# Patient Record
Sex: Female | Born: 1935 | Race: White | Hispanic: No | Marital: Single | Smoking: Never smoker
Health system: Southern US, Community
[De-identification: ages and names within clinical notes are randomized; demographics above are authoritative.]

## PROBLEM LIST (undated history)

## (undated) DIAGNOSIS — E785 Hyperlipidemia, unspecified: Secondary | ICD-10-CM

## (undated) DIAGNOSIS — R609 Edema, unspecified: Secondary | ICD-10-CM

## (undated) DIAGNOSIS — D649 Anemia, unspecified: Secondary | ICD-10-CM

## (undated) DIAGNOSIS — C4490 Unspecified malignant neoplasm of skin, unspecified: Secondary | ICD-10-CM

## (undated) DIAGNOSIS — C44721 Squamous cell carcinoma of skin of unspecified lower limb, including hip: Secondary | ICD-10-CM

## (undated) DIAGNOSIS — R2681 Unsteadiness on feet: Secondary | ICD-10-CM

## (undated) DIAGNOSIS — M8580 Other specified disorders of bone density and structure, unspecified site: Secondary | ICD-10-CM

## (undated) DIAGNOSIS — I341 Nonrheumatic mitral (valve) prolapse: Secondary | ICD-10-CM

## (undated) DIAGNOSIS — S5290XA Unspecified fracture of unspecified forearm, initial encounter for closed fracture: Secondary | ICD-10-CM

## (undated) DIAGNOSIS — E039 Hypothyroidism, unspecified: Secondary | ICD-10-CM

## (undated) DIAGNOSIS — S7290XA Unspecified fracture of unspecified femur, initial encounter for closed fracture: Secondary | ICD-10-CM

## (undated) DIAGNOSIS — F0390 Unspecified dementia without behavioral disturbance: Secondary | ICD-10-CM

## (undated) DIAGNOSIS — I10 Essential (primary) hypertension: Secondary | ICD-10-CM

## (undated) DIAGNOSIS — K5900 Constipation, unspecified: Secondary | ICD-10-CM

## (undated) DIAGNOSIS — E559 Vitamin D deficiency, unspecified: Secondary | ICD-10-CM

## (undated) HISTORY — DX: Unspecified dementia without behavioral disturbance: F03.90

## (undated) HISTORY — DX: Constipation, unspecified: K59.00

## (undated) HISTORY — DX: Squamous cell carcinoma of skin of unspecified lower limb, including hip: C44.721

## (undated) HISTORY — DX: Unsteadiness on feet: R26.81

## (undated) HISTORY — DX: Edema, unspecified: R60.9

## (undated) HISTORY — PX: ABDOMINAL HYSTERECTOMY: SHX81

---

## 1997-11-26 ENCOUNTER — Ambulatory Visit (HOSPITAL_COMMUNITY): Admission: RE | Admit: 1997-11-26 | Discharge: 1997-11-26 | Payer: Self-pay | Admitting: Family Medicine

## 1998-11-01 ENCOUNTER — Ambulatory Visit (HOSPITAL_COMMUNITY): Admission: RE | Admit: 1998-11-01 | Discharge: 1998-11-01 | Payer: Self-pay | Admitting: Obstetrics & Gynecology

## 1999-08-06 ENCOUNTER — Encounter: Admission: RE | Admit: 1999-08-06 | Discharge: 1999-08-06 | Payer: Self-pay | Admitting: Family Medicine

## 1999-08-06 ENCOUNTER — Encounter: Payer: Self-pay | Admitting: Family Medicine

## 2000-08-06 ENCOUNTER — Encounter: Payer: Self-pay | Admitting: Family Medicine

## 2000-08-06 ENCOUNTER — Encounter: Admission: RE | Admit: 2000-08-06 | Discharge: 2000-08-06 | Payer: Self-pay | Admitting: Family Medicine

## 2001-08-01 ENCOUNTER — Encounter: Payer: Self-pay | Admitting: Family Medicine

## 2001-08-01 ENCOUNTER — Encounter: Admission: RE | Admit: 2001-08-01 | Discharge: 2001-08-01 | Payer: Self-pay | Admitting: Family Medicine

## 2001-12-22 ENCOUNTER — Ambulatory Visit (HOSPITAL_COMMUNITY): Admission: RE | Admit: 2001-12-22 | Discharge: 2001-12-22 | Payer: Self-pay | Admitting: Specialist

## 2002-08-07 ENCOUNTER — Encounter: Payer: Self-pay | Admitting: Family Medicine

## 2002-08-07 ENCOUNTER — Encounter: Admission: RE | Admit: 2002-08-07 | Discharge: 2002-08-07 | Payer: Self-pay | Admitting: Family Medicine

## 2003-04-24 ENCOUNTER — Emergency Department (HOSPITAL_COMMUNITY): Admission: EM | Admit: 2003-04-24 | Discharge: 2003-04-24 | Payer: Self-pay | Admitting: Emergency Medicine

## 2003-08-27 ENCOUNTER — Ambulatory Visit (HOSPITAL_COMMUNITY): Admission: RE | Admit: 2003-08-27 | Discharge: 2003-08-27 | Payer: Self-pay | Admitting: Family Medicine

## 2004-10-06 ENCOUNTER — Ambulatory Visit (HOSPITAL_COMMUNITY): Admission: RE | Admit: 2004-10-06 | Discharge: 2004-10-06 | Payer: Self-pay | Admitting: Family Medicine

## 2004-10-20 ENCOUNTER — Encounter: Admission: RE | Admit: 2004-10-20 | Discharge: 2004-10-20 | Payer: Self-pay | Admitting: Family Medicine

## 2005-10-07 ENCOUNTER — Ambulatory Visit (HOSPITAL_COMMUNITY): Admission: RE | Admit: 2005-10-07 | Discharge: 2005-10-07 | Payer: Self-pay | Admitting: Family Medicine

## 2006-02-10 ENCOUNTER — Emergency Department (HOSPITAL_COMMUNITY): Admission: EM | Admit: 2006-02-10 | Discharge: 2006-02-10 | Payer: Self-pay | Admitting: Emergency Medicine

## 2006-03-05 ENCOUNTER — Encounter: Admission: RE | Admit: 2006-03-05 | Discharge: 2006-03-05 | Payer: Self-pay | Admitting: Gastroenterology

## 2006-10-12 ENCOUNTER — Ambulatory Visit (HOSPITAL_COMMUNITY): Admission: RE | Admit: 2006-10-12 | Discharge: 2006-10-12 | Payer: Self-pay | Admitting: Family Medicine

## 2007-01-05 ENCOUNTER — Encounter: Admission: RE | Admit: 2007-01-05 | Discharge: 2007-01-05 | Payer: Self-pay | Admitting: Geriatric Medicine

## 2007-01-14 ENCOUNTER — Encounter: Admission: RE | Admit: 2007-01-14 | Discharge: 2007-01-14 | Payer: Self-pay | Admitting: Geriatric Medicine

## 2007-10-18 ENCOUNTER — Ambulatory Visit (HOSPITAL_COMMUNITY): Admission: RE | Admit: 2007-10-18 | Discharge: 2007-10-18 | Payer: Self-pay | Admitting: Geriatric Medicine

## 2009-10-22 ENCOUNTER — Inpatient Hospital Stay (HOSPITAL_COMMUNITY): Admission: EM | Admit: 2009-10-22 | Discharge: 2009-10-28 | Payer: Self-pay | Admitting: Emergency Medicine

## 2010-07-19 ENCOUNTER — Encounter: Payer: Self-pay | Admitting: Gastroenterology

## 2010-07-20 ENCOUNTER — Encounter: Payer: Self-pay | Admitting: Family Medicine

## 2010-07-20 ENCOUNTER — Encounter: Payer: Self-pay | Admitting: Geriatric Medicine

## 2010-09-16 LAB — BASIC METABOLIC PANEL
BUN: 24 mg/dL — ABNORMAL HIGH (ref 6–23)
BUN: 29 mg/dL — ABNORMAL HIGH (ref 6–23)
BUN: 32 mg/dL — ABNORMAL HIGH (ref 6–23)
CO2: 26 mEq/L (ref 19–32)
CO2: 29 mEq/L (ref 19–32)
CO2: 28 mEq/L (ref 19–32)
Calcium: 8 mg/dL — ABNORMAL LOW (ref 8.4–10.5)
Calcium: 8.3 mg/dL — ABNORMAL LOW (ref 8.4–10.5)
Calcium: 8 mg/dL — ABNORMAL LOW (ref 8.4–10.5)
Calcium: 8 mg/dL — ABNORMAL LOW (ref 8.4–10.5)
Chloride: 105 mEq/L (ref 96–112)
Creatinine, Ser: 0.84 mg/dL (ref 0.4–1.2)
Creatinine, Ser: 0.85 mg/dL (ref 0.4–1.2)
GFR calc non Af Amer: >60 mL/min (ref >60)
GFR calc non Af Amer: 34 mL/min — ABNORMAL LOW (ref >60)
GFR calc non Af Amer: 41 mL/min — ABNORMAL LOW (ref >60)
GFR calc non Af Amer: 44 mL/min — ABNORMAL LOW (ref >60)
Glucose, Bld: 95 mg/dL (ref 70–99)
Glucose, Bld: 102 mg/dL — ABNORMAL HIGH (ref 70–99)
Glucose, Bld: 111 mg/dL — ABNORMAL HIGH (ref 70–99)
Potassium: 3.6 mEq/L (ref 3.5–5.1)
Potassium: 4.4 mEq/L (ref 3.5–5.1)
Potassium: 4.8 mEq/L (ref 3.5–5.1)
Sodium: 135 mEq/L (ref 135–145)
Sodium: 135 mEq/L (ref 135–145)

## 2010-09-16 LAB — CBC
HCT: 31.2 % — ABNORMAL LOW (ref 36.0–46.0)
HCT: 32.2 % — ABNORMAL LOW (ref 36.0–46.0)
HCT: 33.4 % — ABNORMAL LOW (ref 36.0–46.0)
Hemoglobin: 11 g/dL — ABNORMAL LOW (ref 12.0–15.0)
Hemoglobin: 10.1 g/dL — ABNORMAL LOW (ref 12.0–15.0)
Hemoglobin: 10.9 g/dL — ABNORMAL LOW (ref 12.0–15.0)
Hemoglobin: 11.1 g/dL — ABNORMAL LOW (ref 12.0–15.0)
MCHC: 33.2 g/dL (ref 30.0–36.0)
MCV: 92 fL (ref 78.0–100.0)
MCV: 92.1 fL (ref 78.0–100.0)
Platelets: 122 10*3/uL — ABNORMAL LOW (ref 150–400)
Platelets: 149 10*3/uL — ABNORMAL LOW (ref 150–400)
Platelets: 75 10*3/uL — ABNORMAL LOW (ref 150–400)
Platelets: 77 10*3/uL — ABNORMAL LOW (ref 150–400)
RBC: 3.57 MIL/uL — ABNORMAL LOW (ref 3.87–5.11)
RBC: 3.49 MIL/uL — ABNORMAL LOW (ref 3.87–5.11)
RDW: 13.6 % (ref 11.5–15.5)
RDW: 13.7 % (ref 11.5–15.5)
WBC: 9.9 10*3/uL (ref 4.0–10.5)
WBC: 10.8 10*3/uL — ABNORMAL HIGH (ref 4.0–10.5)
WBC: 11.2 10*3/uL — ABNORMAL HIGH (ref 4.0–10.5)
WBC: 9.3 10*3/uL (ref 4.0–10.5)

## 2010-09-16 LAB — COMPREHENSIVE METABOLIC PANEL
ALT: 22 U/L (ref 0–35)
Albumin: 3.3 g/dL — ABNORMAL LOW (ref 3.5–5.2)
BUN: 26 mg/dL — ABNORMAL HIGH (ref 6–23)
CO2: 30 mEq/L (ref 19–32)
Calcium: 8.4 mg/dL (ref 8.4–10.5)
GFR calc Af Amer: 45 mL/min — ABNORMAL LOW (ref >60)
Glucose, Bld: 152 mg/dL — ABNORMAL HIGH (ref 70–99)
Total Protein: 6.3 g/dL (ref 6.0–8.3)

## 2010-09-16 LAB — CROSSMATCH
ABO/RH(D): O POS
Antibody Screen: NEGATIVE

## 2010-09-16 LAB — GLUCOSE, CAPILLARY: Glucose-Capillary: 115 mg/dL — ABNORMAL HIGH (ref 70–99)

## 2010-09-16 LAB — DIFFERENTIAL
Basophils Relative: 0 % (ref 0–1)
Eosinophils Absolute: 0 10*3/uL (ref 0.0–0.7)
Lymphocytes Relative: 13 % (ref 12–46)
Neutro Abs: 7.4 10*3/uL (ref 1.7–7.7)

## 2010-09-16 LAB — URINALYSIS, ROUTINE W REFLEX MICROSCOPIC
Bilirubin Urine: NEGATIVE
Hgb urine dipstick: NEGATIVE
Specific Gravity, Urine: 1.019 (ref 1.005–1.030)

## 2010-09-16 LAB — PROTIME-INR
INR: 1.14 (ref 0.00–1.49)
Prothrombin Time: 14.5 seconds (ref 11.6–15.2)

## 2010-09-16 LAB — URINE CULTURE

## 2010-09-16 LAB — APTT
aPTT: 26 seconds (ref 24–37)
aPTT: 30 seconds (ref 24–37)

## 2011-06-30 HISTORY — PX: TOTAL HIP ARTHROPLASTY: SHX124

## 2011-12-02 ENCOUNTER — Emergency Department (HOSPITAL_COMMUNITY): Payer: Medicare Other

## 2011-12-02 ENCOUNTER — Inpatient Hospital Stay (HOSPITAL_COMMUNITY)
Admission: EM | Admit: 2011-12-02 | Discharge: 2011-12-07 | DRG: 481 | Disposition: A | Discharge status: Skilled Nursing Facility | Payer: Medicare Other | Attending: Internal Medicine | Admitting: Internal Medicine

## 2011-12-02 ENCOUNTER — Encounter (HOSPITAL_COMMUNITY): Payer: Self-pay | Admitting: Emergency Medicine

## 2011-12-02 DIAGNOSIS — S72009A Fracture of unspecified part of neck of unspecified femur, initial encounter for closed fracture: Secondary | ICD-10-CM

## 2011-12-02 DIAGNOSIS — F0390 Unspecified dementia without behavioral disturbance: Secondary | ICD-10-CM | POA: Diagnosis present

## 2011-12-02 DIAGNOSIS — E860 Dehydration: Secondary | ICD-10-CM

## 2011-12-02 DIAGNOSIS — S72001A Fracture of unspecified part of neck of right femur, initial encounter for closed fracture: Secondary | ICD-10-CM | POA: Diagnosis present

## 2011-12-02 DIAGNOSIS — S0990XA Unspecified injury of head, initial encounter: Secondary | ICD-10-CM

## 2011-12-02 DIAGNOSIS — E039 Hypothyroidism, unspecified: Secondary | ICD-10-CM | POA: Diagnosis present

## 2011-12-02 DIAGNOSIS — D62 Acute posthemorrhagic anemia: Secondary | ICD-10-CM | POA: Diagnosis not present

## 2011-12-02 DIAGNOSIS — S72143A Displaced intertrochanteric fracture of unspecified femur, initial encounter for closed fracture: Principal | ICD-10-CM | POA: Diagnosis present

## 2011-12-02 DIAGNOSIS — I10 Essential (primary) hypertension: Secondary | ICD-10-CM

## 2011-12-02 DIAGNOSIS — S0003XA Contusion of scalp, initial encounter: Secondary | ICD-10-CM | POA: Diagnosis present

## 2011-12-02 DIAGNOSIS — Y92009 Unspecified place in unspecified non-institutional (private) residence as the place of occurrence of the external cause: Secondary | ICD-10-CM

## 2011-12-02 DIAGNOSIS — W010XXA Fall on same level from slipping, tripping and stumbling without subsequent striking against object, initial encounter: Secondary | ICD-10-CM | POA: Diagnosis present

## 2011-12-02 HISTORY — DX: Anemia, unspecified: D64.9

## 2011-12-02 HISTORY — DX: Essential (primary) hypertension: I10

## 2011-12-02 HISTORY — DX: Hyperlipidemia, unspecified: E78.5

## 2011-12-02 HISTORY — DX: Unspecified fracture of unspecified femur, initial encounter for closed fracture: S72.90XA

## 2011-12-02 HISTORY — DX: Unspecified fracture of unspecified forearm, initial encounter for closed fracture: S52.90XA

## 2011-12-02 HISTORY — DX: Other specified disorders of bone density and structure, unspecified site: M85.80

## 2011-12-02 LAB — CBC
HCT: 36.5 % (ref 36.0–46.0)
Hemoglobin: 12 g/dL (ref 12.0–15.0)
MCHC: 32.9 g/dL (ref 30.0–36.0)
RBC: 4.23 MIL/uL (ref 3.87–5.11)
WBC: 9.7 10*3/uL (ref 4.0–10.5)

## 2011-12-02 LAB — DIFFERENTIAL
Basophils Relative: 1 % (ref 0–1)
Lymphocytes Relative: 25 % (ref 12–46)
Lymphs Abs: 2.4 10*3/uL (ref 0.7–4.0)
Monocytes Absolute: 0.7 10*3/uL (ref 0.1–1.0)
Monocytes Relative: 7 % (ref 3–12)
Neutro Abs: 6.4 10*3/uL (ref 1.7–7.7)
Neutrophils Relative %: 66 % (ref 43–77)

## 2011-12-02 LAB — URINALYSIS, ROUTINE W REFLEX MICROSCOPIC
Glucose, UA: NEGATIVE mg/dL
Leukocytes, UA: NEGATIVE
Protein, ur: NEGATIVE mg/dL
Specific Gravity, Urine: 1.017 (ref 1.005–1.030)
Urobilinogen, UA: 0.2 mg/dL (ref 0.0–1.0)

## 2011-12-02 LAB — BASIC METABOLIC PANEL
BUN: 25 mg/dL — ABNORMAL HIGH (ref 6–23)
CO2: 25 mEq/L (ref 19–32)
Chloride: 98 mEq/L (ref 96–112)
GFR calc Af Amer: 42 mL/min — ABNORMAL LOW (ref >90)
Potassium: 3.5 mEq/L (ref 3.5–5.1)

## 2011-12-02 LAB — TYPE AND SCREEN
ABO/RH(D): O POS
Antibody Screen: NEGATIVE

## 2011-12-02 LAB — URINE MICROSCOPIC-ADD ON

## 2011-12-02 MED ORDER — MORPHINE SULFATE 2 MG/ML IJ SOLN
0.5000 mg | INTRAMUSCULAR | Status: DC | PRN
  Administered 2011-12-02 – 2011-12-04 (×2): 0.5 mg via INTRAVENOUS
  Filled 2011-12-02 (×2): qty 1

## 2011-12-02 MED ORDER — SODIUM CHLORIDE 0.9 % IV BOLUS (SEPSIS): 1000.0000 mL | Freq: Once | INTRAVENOUS | Status: DC

## 2011-12-02 MED ORDER — ONDANSETRON HCL 4 MG/2ML IJ SOLN
4.0000 mg | Freq: Once | INTRAMUSCULAR | Status: AC
  Administered 2011-12-02: 4 mg via INTRAVENOUS
  Filled 2011-12-02: qty 2

## 2011-12-02 MED ORDER — SODIUM CHLORIDE 0.9 % IV SOLN: INTRAVENOUS | Status: DC

## 2011-12-02 MED ORDER — SODIUM CHLORIDE 0.9 % IV SOLN
INTRAVENOUS | Status: DC
  Administered 2011-12-02: 1000 mL via INTRAVENOUS
  Administered 2011-12-03: 12:00:00 via INTRAVENOUS
  Administered 2011-12-04: 50 mL/h via INTRAVENOUS

## 2011-12-02 MED ORDER — SODIUM CHLORIDE 0.9 % IV SOLN
INTRAVENOUS | Status: DC
  Administered 2011-12-02: 19:00:00 via INTRAVENOUS

## 2011-12-02 MED ORDER — FENTANYL CITRATE 0.05 MG/ML IJ SOLN
50.0000 ug | Freq: Once | INTRAMUSCULAR | Status: AC
  Administered 2011-12-02: 50 ug via INTRAVENOUS
  Filled 2011-12-02: qty 2

## 2011-12-02 NOTE — ED Notes (Signed)
Off floor for testing 

## 2011-12-02 NOTE — ED Notes (Addendum)
Per EMS, witnessed fall-right hip injury, bump on back of head, back pain with palpation-unknown whether or not she lost consciousness-133mcg of fentanyl given in route

## 2011-12-02 NOTE — Consult Note (Signed)
Reason for Consult: Right intertrochanteric femur fracture  Referring Physician: Dr Chilton Si physician  Jasmin Rodriguez is an 78 y.o. female.  HPI: Jasmin Rodriguez is a 78 year old female who lives independently at [facility] and fell tonight in her living room. She denies, dizziness, light headedness, chest pain, SOB, palpitations, or other systemic symptoms associated with the fall. She feels as though she may have tripped on something leading to the fall. She developed immediate right hip pain and inability to stand up. She did not have a head injury or LOC. Only complaint in ED is right hip pain. No distal paresthesias. No knee pain or lower leg pain. She has a history of a left intertrochanteric femur fracture treated by Dr. Charlann Boxer several years ago. She ambulates inde[pendently with occasional use of a cane but usually does not use any assistive devices.  Past Medical History  Diagnosis Date  . Hypertension   . Anemia   . Thyroid disease   . Hyperlipidemia   . Osteopenia   . Radial fracture   . Femur fracture     Past Surgical History  Procedure Date  . Hip fracture surgery     No family history on file.  Social History:  does not have a smoking history on file. She does not have any smokeless tobacco history on file. She reports that she does not drink alcohol or use illicit drugs.  Allergies:  Allergies  Allergen Reactions  . Tramadol   . Zoloft (Sertraline Hcl)     Medications: I have reviewed the patient's current medications.  Results for orders placed during the hospital encounter of 12/02/11 (from the past 48 hour(s))  CBC     Status: Normal   Collection Time   12/02/11  6:55 PM      Component Value Range Comment   WBC 9.7  4.0 - 10.5 (K/uL)    RBC 4.23  3.87 - 5.11 (MIL/uL)    Hemoglobin 12.0  12.0 - 15.0 (g/dL)    HCT 96.0  45.4 - 09.8 (%)    MCV 86.3  78.0 - 100.0 (fL)    MCH 28.4  26.0 - 34.0 (pg)    MCHC 32.9  30.0 - 36.0 (g/dL)    RDW 11.9   14.7 - 82.9 (%)    Platelets 168  150 - 400 (K/uL)   DIFFERENTIAL     Status: Normal   Collection Time   12/02/11  6:55 PM      Component Value Range Comment   Neutrophils Relative 66  43 - 77 (%)    Neutro Abs 6.4  1.7 - 7.7 (K/uL)    Lymphocytes Relative 25  12 - 46 (%)    Lymphs Abs 2.4  0.7 - 4.0 (K/uL)    Monocytes Relative 7  3 - 12 (%)    Monocytes Absolute 0.7  0.1 - 1.0 (K/uL)    Eosinophils Relative 1  0 - 5 (%)    Eosinophils Absolute 0.1  0.0 - 0.7 (K/uL)    Basophils Relative 1  0 - 1 (%)    Basophils Absolute 0.1  0.0 - 0.1 (K/uL)   BASIC METABOLIC PANEL     Status: Abnormal   Collection Time   12/02/11  6:55 PM      Component Value Range Comment   Sodium 135  135 - 145 (mEq/L)    Potassium 3.5  3.5 - 5.1 (mEq/L)    Chloride 98  96 - 112 (  mEq/L)    CO2 25  19 - 32 (mEq/L)    Glucose, Bld 132 (*) 70 - 99 (mg/dL)    BUN 25 (*) 6 - 23 (mg/dL)    Creatinine, Ser 3.08 (*) 0.50 - 1.10 (mg/dL)    Calcium 9.1  8.4 - 10.5 (mg/dL)    GFR calc non Af Amer 36 (*) >90 (mL/min)    GFR calc Af Amer 42 (*) >90 (mL/min)   PROTIME-INR     Status: Normal   Collection Time   12/02/11  6:55 PM      Component Value Range Comment   Prothrombin Time 14.3  11.6 - 15.2 (seconds)    INR 1.09  0.00 - 1.49    TYPE AND SCREEN     Status: Normal   Collection Time   12/02/11  6:55 PM      Component Value Range Comment   ABO/RH(D) O POS      Antibody Screen NEG      Sample Expiration 12/05/2011     URINALYSIS, ROUTINE W REFLEX MICROSCOPIC     Status: Abnormal   Collection Time   12/02/11  7:30 PM      Component Value Range Comment   Color, Urine YELLOW  YELLOW     APPearance CLOUDY (*) CLEAR     Specific Gravity, Urine 1.017  1.005 - 1.030     pH 6.5  5.0 - 8.0     Glucose, UA NEGATIVE  NEGATIVE (mg/dL)    Hgb urine dipstick TRACE (*) NEGATIVE     Bilirubin Urine NEGATIVE  NEGATIVE     Ketones, ur NEGATIVE  NEGATIVE (mg/dL)    Protein, ur NEGATIVE  NEGATIVE (mg/dL)    Urobilinogen, UA 0.2   0.0 - 1.0 (mg/dL)    Nitrite NEGATIVE  NEGATIVE     Leukocytes, UA NEGATIVE  NEGATIVE    URINE MICROSCOPIC-ADD ON     Status: Normal   Collection Time   12/02/11  7:30 PM      Component Value Range Comment   RBC / HPF 0-2  <3 (RBC/hpf)     Dg Chest 1 View  12/02/2011  *RADIOLOGY REPORT*  Clinical Data: Fall, hip pain  CHEST - 1 VIEW  Comparison: 10/21/2009  Findings: Cardiomegaly again noted.  No acute infiltrate or pleural effusion.  No pulmonary edema.  Stable calcified granuloma right lower lobe.  IMPRESSION: No active disease.  No significant change.  Original Report Authenticated By: Natasha Mead, M.D.   Dg Hip Complete Right  12/02/2011  *RADIOLOGY REPORT*  Clinical Data: Fall, right hip pain  RIGHT HIP - COMPLETE 2+ VIEW  Comparison: 10/21/2009.  Findings: Three views of the right hip submitted.  There is displaced intertrochanteric fracture of the right proximal femur. Metallic fixation pin and metallic rod noted in proximal left femur.  Osteopenia is noted.  Degenerative changes lumbar spine. A subtrochanteric vertical fracture line is extending in the proximal femoral shaft right femur.  IMPRESSION: Displaced intertrochanteric fracture of the right proximal femur. There is diffuse osteopenia.  Per CMS PQRS reporting requirements (PQRS Measure 24): Given the patient's age of greater than 50 and the fracture site (hip, distal radius, or spine), the patient should be tested for osteoporosis using DXA, and the appropriate treatment considered based on the DXA results.  Original Report Authenticated By: Natasha Mead, M.D.   Ct Head Wo Contrast  12/02/2011  *RADIOLOGY REPORT*  Clinical Data: Larey Seat.  Hit head.  CT HEAD WITHOUT CONTRAST  Technique:  Contiguous axial images were obtained from the base of the skull through the vertex without contrast.  Comparison: None  Findings: There is a right occipital scalp hematoma without underlying skull fracture or radiopaque foreign body.  There is age related  cerebral atrophy, ventriculomegaly and periventricular white matter disease.  No extra-axial fluid collections.  No CT findings for hemispheric infarction and/or intracranial hemorrhage.  The brainstem and cerebellum grossly normal.  The bony structures are intact.  The paranasal sinuses mastoid air cells are clear.  IMPRESSION:  1.  Age related cerebral atrophy, ventriculomegaly and periventricular white matter disease. No acute intracranial findings. 2.  Right occipital scalp hematoma without underlying skull fracture.  Original Report Authenticated By: P. Loralie Champagne, M.D.    ROS Blood pressure 162/42, pulse 64, temperature 98.6 F (37 C), temperature source Oral, resp. rate 18, SpO2 99.00%. Physical Exam  Physical Examination: General appearance - alert, well appearing, and in no distress Mental status - alert, oriented to person, place, and time Chest - clear to auscultation, no wheezes, rales or rhonchi, symmetric air entry Heart - normal rate, regular rhythm, normal S1, S2, no murmurs, rubs, clicks or gallops Abdomen - soft, nontender, nondistended, no masses or organomegaly Neurological - alert, oriented, normal speech, no focal findings or movement disorder noted Extremities - peripheral pulses normal, no pedal edema, no clubbing or cyanosis Right lower extremity shortened and externally rotated. Tender right hip area. Neurovascular intact RLE with 2+ DP pulse.  Radiograph shows right 2 part intertrochanteric femur fracture with tiny area of subtrochanteric extension.  Assessment/Plan:  Right intertrochanteric femur fracture- Will require operative fixation in order to allow patient to become ambulatory again. Discussed operative vs. Non-op treatment and pros/cons of both and she elects to proceed with operative treatment. Will either have a partner do it tomorrow or I will do it on the afternoon of 6/6. Will do mechanical DVT prophylaxis for tonight in case surgery can be done  tomorrow. If delayed until Friday 6/6 will begin Lovenox tomorrow pre-op or begin tomorrow post -op if surgery done tomorrow. Will require SNF placement post-op which can be done at [facility] as they have a rehab facility.  [name] 12/02/2011, 10:14 PM

## 2011-12-02 NOTE — ED Provider Notes (Signed)
History     CSN: [id number]  Arrival date & time 12/02/11  1754   First MD Initiated Contact with Patient 12/02/11 1827      Chief Complaint  Patient presents with  . Fall    (Consider location/radiation/quality/duration/timing/severity/associated sxs/prior treatment) HPI Comments: Jasmin Rodriguez is a 78 y.o. Female who is seated watching a program, stood up, fell backwards, and injured her head. She is unable to get up and walk afterwards. Because of right hip pain. She did not lose consciousness. She presents per EMS, fully immobilized. Patient does not recall feeling dizzy prior to the fall. She ate today. She denies recent fever, chills, nausea, vomiting, change in bowel or urinary habits.  The history is provided by the patient.    Past Medical History  Diagnosis Date  . Hypertension   . Anemia   . Thyroid disease   . Hyperlipidemia   . Osteopenia   . Radial fracture   . Femur fracture     Past Surgical History  Procedure Date  . Hip fracture surgery     No family history on file.  History  Substance Use Topics  . Smoking status: Never Smoker   . Smokeless tobacco: Not on file  . Alcohol Use: No    OB History    Grav Para Term Preterm Abortions TAB SAB Ect Mult Living                  Review of Systems  All other systems reviewed and are negative.    Allergies  Tramadol and Zoloft  Home Medications   Current Outpatient Rx  Name Route Sig Dispense Refill  . AMLODIPINE BESYLATE 5 MG PO TABS Oral Take 5 mg by mouth daily.    . ASPIRIN 81 MG PO CHEW Oral Chew 81 mg by mouth daily.    Marland Kitchen CALCIUM CARBONATE-VITAMIN D 500-200 MG-UNIT PO TABS Oral Take 1 tablet by mouth 2 (two) times daily.    Marland Kitchen VITAMIN D 1000 UNITS PO TABS Oral Take 2,000 Units by mouth daily.    Marland Kitchen CITALOPRAM HYDROBROMIDE 40 MG PO TABS Oral Take 40 mg by mouth daily.    . DONEPEZIL HCL 10 MG PO TABS Oral Take 10 mg by mouth at bedtime.    Marland Kitchen LEVOTHYROXINE SODIUM 75 MCG PO TABS Oral  Take 75 mcg by mouth daily.    . ADULT MULTIVITAMIN W/MINERALS CH Oral Take 1 tablet by mouth daily.      BP 162/42  Pulse 64  Temp(Src) 98.6 F (37 C) (Oral)  Resp 18  SpO2 99%  Physical Exam  Nursing note and vitals reviewed. Constitutional: She is oriented to person, place, and time. She appears well-developed and well-nourished.  HENT:  Head: Normocephalic.       Right occipital hematoma  Eyes: Conjunctivae and EOM are normal. Pupils are equal, round, and reactive to light.  Neck: Normal range of motion and phonation normal. Neck supple.  Cardiovascular: Normal rate, regular rhythm and intact distal pulses.   Pulmonary/Chest: Effort normal and breath sounds normal. She exhibits no tenderness.  Abdominal: Soft. She exhibits no distension. There is no tenderness. There is no guarding.  Musculoskeletal:       Right hip tender, resists movement due to pain, and the right leg is short. She is neurovascularly intact distally in the right foot  Neurological: She is alert and oriented to person, place, and time. She has normal strength. She exhibits normal muscle tone.  Skin:  Skin is warm and dry.  Psychiatric: She has a normal mood and affect. Her behavior is normal.    ED Course  Procedures (including critical care time) She was removed from the backboard and cervical collar without problems.  During initial exam, she had an episode of nausea and vomiting that was treated with Zofran. Pain was controlled with IV narcotic analgesia.  Admission arranged with hospitalist and consulted orthopedics to evaluate for surgical procedure.    Date: 12/02/2011  Rate: 68  Rhythm: normal sinus rhythm  QRS Axis: normal  Intervals: normal  ST/T Wave abnormalities: nonspecific ST changes  Conduction Disutrbances:left anterior fascicular block  Narrative Interpretation:   Old EKG Reviewed: unchanged   Labs Reviewed  BASIC METABOLIC PANEL - Abnormal; Notable for the following:     Glucose, Bld 132 (*)    BUN 25 (*)    Creatinine, Ser 1.23 (*)    GFR calc non Af Amer 36 (*)    GFR calc Af Amer 42 (*)    All other components within normal limits  URINALYSIS, ROUTINE W REFLEX MICROSCOPIC - Abnormal; Notable for the following:    APPearance CLOUDY (*)    Hgb urine dipstick TRACE (*)    All other components within normal limits  CBC  DIFFERENTIAL  PROTIME-INR  TYPE AND SCREEN  URINE MICROSCOPIC-ADD ON  URINE CULTURE   Dg Chest 1 View  12/02/2011  *RADIOLOGY REPORT*  Clinical Data: Fall, hip pain  CHEST - 1 VIEW  Comparison: 10/21/2009  Findings: Cardiomegaly again noted.  No acute infiltrate or pleural effusion.  No pulmonary edema.  Stable calcified granuloma right lower lobe.  IMPRESSION: No active disease.  No significant change.  Original Report Authenticated By: Natasha Mead, M.D.   Dg Hip Complete Right  12/02/2011  *RADIOLOGY REPORT*  Clinical Data: Fall, right hip pain  RIGHT HIP - COMPLETE 2+ VIEW  Comparison: 10/21/2009.  Findings: Three views of the right hip submitted.  There is displaced intertrochanteric fracture of the right proximal femur. Metallic fixation pin and metallic rod noted in proximal left femur.  Osteopenia is noted.  Degenerative changes lumbar spine. A subtrochanteric vertical fracture line is extending in the proximal femoral shaft right femur.  IMPRESSION: Displaced intertrochanteric fracture of the right proximal femur. There is diffuse osteopenia.  Per CMS PQRS reporting requirements (PQRS Measure 24): Given the patient's age of greater than 50 and the fracture site (hip, distal radius, or spine), the patient should be tested for osteoporosis using DXA, and the appropriate treatment considered based on the DXA results.  Original Report Authenticated By: Natasha Mead, M.D.   Ct Head Wo Contrast  12/02/2011  *RADIOLOGY REPORT*  Clinical Data: Larey Seat.  Hit head.  CT HEAD WITHOUT CONTRAST  Technique:  Contiguous axial images were obtained from the base of  the skull through the vertex without contrast.  Comparison: None  Findings: There is a right occipital scalp hematoma without underlying skull fracture or radiopaque foreign body.  There is age related cerebral atrophy, ventriculomegaly and periventricular white matter disease.  No extra-axial fluid collections.  No CT findings for hemispheric infarction and/or intracranial hemorrhage.  The brainstem and cerebellum grossly normal.  The bony structures are intact.  The paranasal sinuses mastoid air cells are clear.  IMPRESSION:  1.  Age related cerebral atrophy, ventriculomegaly and periventricular white matter disease. No acute intracranial findings. 2.  Right occipital scalp hematoma without underlying skull fracture.  Original Report Authenticated By: P. Loralie Champagne, M.D.  1. Hip fracture   2. Head injuries       MDM  Fall with hip fracture, cause not clear. Likely mild dehydration, but no apparent pneumonia, or urinary tract infection. Doubt serious head injury. Patient is to be admitted for further management.   Plan: Admit- Hospitalist      Flint Melter, MD 12/02/11 [phone]

## 2011-12-02 NOTE — H&P (Addendum)
PCP:  Merlene Laughter   Chief Complaint:   Right hip pain  HPI: Jasmin Rodriguez is a 78 y.o. female   has a past medical history of Hypertension; Anemia; Thyroid disease; Hyperlipidemia; Osteopenia; Radial fracture; and Femur fracture.   Presented with  Fall tonight, thinks may have tripped on something. Did not hit her head. No recent chest pain no shortness of breath, no syncope. Normally can walk with cane when uses it or on her own power. Patient has history of mild dementia but is currently alert and oriented. She states she is in no pain in this leg is being manipulated. She currently resides in independent living and states that her health has been exceptionally good.   Review of Systems:    Pertinent positives include: fall, right hip pain  Constitutional:  No weight loss, night sweats, Fevers, chills, fatigue, weight loss  HEENT:  No headaches, Difficulty swallowing,Tooth/dental problems,Sore throat,  No sneezing, itching, ear ache, nasal congestion, post nasal drip,  Cardio-vascular:  No chest pain, Orthopnea, PND, anasarca, dizziness, palpitations.no Bilateral lower extremity swelling  GI:  No heartburn, indigestion, abdominal pain, nausea, vomiting, diarrhea, change in bowel habits, loss of appetite, melena, blood in stool, hematemesis Resp:  no shortness of breath at rest. No dyspnea on exertion, No excess mucus, no productive cough, No non-productive cough, No coughing up of blood.No change in color of mucus.No wheezing. Skin:  no rash or lesions. No jaundice GU:  no dysuria, change in color of urine, no urgency or frequency. No straining to urinate.  No flank pain.  Musculoskeletal:  No joint pain or no joint swelling. No decreased range of motion. No back pain.  Psych:  No change in mood or affect. No depression or anxiety. No memory loss.  Neuro: no localizing neurological complaints, no tingling, no weakness, no double vision, no gait abnormality, no slurred  speech, no confusion  Otherwise ROS are negative except for above, 10 systems were reviewed  Past Medical History: Past Medical History  Diagnosis Date  . Hypertension   . Anemia   . Thyroid disease   . Hyperlipidemia   . Osteopenia   . Radial fracture   . Femur fracture    Past Surgical History  Procedure Date  . Hip fracture surgery      Medications: Prior to Admission medications   Medication Sig Start Date End Date Taking? Authorizing Provider  amLODipine (NORVASC) 5 MG tablet Take 5 mg by mouth daily.   Yes Historical Provider, MD  aspirin 81 MG chewable tablet Chew 81 mg by mouth daily.   Yes Historical Provider, MD  calcium-vitamin D (OSCAL WITH D) 500-200 MG-UNIT per tablet Take 1 tablet by mouth 2 (two) times daily.   Yes Historical Provider, MD  cholecalciferol (VITAMIN D) 1000 UNITS tablet Take 2,000 Units by mouth daily.   Yes Historical Provider, MD  citalopram (CELEXA) 40 MG tablet Take 40 mg by mouth daily.   Yes Historical Provider, MD  donepezil (ARICEPT) 10 MG tablet Take 10 mg by mouth at bedtime.   Yes Historical Provider, MD  levothyroxine (SYNTHROID, LEVOTHROID) 75 MCG tablet Take 75 mcg by mouth daily.   Yes Historical Provider, MD  Multiple Vitamin (MULITIVITAMIN WITH MINERALS) TABS Take 1 tablet by mouth daily.   Yes Historical Provider, MD    Allergies:   Allergies  Allergen Reactions  . Tramadol   . Zoloft (Sertraline Hcl)     Social History:  Ambulatory with cane Lives at [facility]  at [facility]   does not have a smoking history on file. She does not have any smokeless tobacco history on file. She reports that she does not drink alcohol or use illicit drugs.   Healthcare power of attorney is Edmonia James telephone number [phone]  Family History: family history is not on file. Noncontributory   Physical Exam: Patient Vitals for the past 24 hrs:  BP Temp Temp src Pulse Resp SpO2  12/02/11 2155 162/42 mmHg 98.6 F (37 C)  Oral 64  18  99 %  12/02/11 1924 - - - - - 100 %  12/02/11 1923 - - - - - 86 %  12/02/11 1806 - - - - - 95 %  12/02/11 1803 193/55 mmHg 97.9 F (36.6 C) Oral 62  20  90 %    1. General:  in No Acute distress 2. Psychological: Alert and  Oriented to self, situation and time 3. Head/ENT:   Moist  Mucous Membranes                          Head Non traumatic, neck supple                          Normal  Dentition 4. SKIN: normal  Skin turgor,  Skin clean Dry and intact no rash 5. Heart: Regular rate and rhythm no Murmur, Rub or gallop 6. Lungs: Clear to auscultation bilaterally, no wheezes or crackles   7. Abdomen: Soft, non-tender, Non distended 8. Lower extremities: no clubbing, cyanosis, or edema 9. Neurologically Grossly intact, moving all 4 extremities equally 10. MSK: Normal range of motion. In all extremities except for right leg which is shortened and there is pain on manipulation  body mass index is unknown because there is no height or weight on file.   Labs on Admission:   [facility] 12/02/11 1855  NA 135  K 3.5  CL 98  CO2 25  GLUCOSE 132*  BUN 25*  CREATININE 1.23*  CALCIUM 9.1  MG --  PHOS --   No results found for this basename: AST:2,ALT:2,ALKPHOS:2,BILITOT:2,PROT:2,ALBUMIN:2 in the last 72 hours No results found for this basename: LIPASE:2,AMYLASE:2 in the last 72 hours  Basename 12/02/11 1855  WBC 9.7  NEUTROABS 6.4  HGB 12.0  HCT 36.5  MCV 86.3  PLT 168   No results found for this basename: CKTOTAL:3,CKMB:3,CKMBINDEX:3,TROPONINI:3 in the last 72 hours No results found for this basename: TSH,T4TOTAL,FREET3,T3FREE,THYROIDAB in the last 72 hours No results found for this basename: VITAMINB12:2,FOLATE:2,FERRITIN:2,TIBC:2,IRON:2,RETICCTPCT:2 in the last 72 hours No results found for this basename: HGBA1C    CrCl is unknown because there is no height on file for the current visit. ABG No results found for this basename: phart, pco2, po2, hco3, tco2,  acidbasedef, o2sat     No results found for this basename: DDIMER     Other results:  I have pearsonaly reviewed this: ECG REPORT  Rate:68  Rhythm: Normal sinus rhythm   ST&T Change: no acute ischemic changes there is some flattening of ST segments throughout Q waves in lead V2 and V1 unchanged from prior. There may be some new borderline repolarization abnormality.   UA no evidence of infection   Cultures:    Component Value Date/Time   SDES URINE, RANDOM 10/21/2009 2300   SPECREQUEST NONE 10/21/2009 2300   CULT NO GROWTH 10/21/2009 2300   REPTSTATUS 10/23/2009 FINAL 10/21/2009 2300  Radiological Exams on Admission: Dg Chest 1 View  12/02/2011  *RADIOLOGY REPORT*  Clinical Data: Fall, hip pain  CHEST - 1 VIEW  Comparison: 10/21/2009  Findings: Cardiomegaly again noted.  No acute infiltrate or pleural effusion.  No pulmonary edema.  Stable calcified granuloma right lower lobe.  IMPRESSION: No active disease.  No significant change.  Original Report Authenticated By: Natasha Mead, M.D.   Dg Hip Complete Right  12/02/2011  *RADIOLOGY REPORT*  Clinical Data: Fall, right hip pain  RIGHT HIP - COMPLETE 2+ VIEW  Comparison: 10/21/2009.  Findings: Three views of the right hip submitted.  There is displaced intertrochanteric fracture of the right proximal femur. Metallic fixation pin and metallic rod noted in proximal left femur.  Osteopenia is noted.  Degenerative changes lumbar spine. A subtrochanteric vertical fracture line is extending in the proximal femoral shaft right femur.  IMPRESSION: Displaced intertrochanteric fracture of the right proximal femur. There is diffuse osteopenia.  Per CMS PQRS reporting requirements (PQRS Measure 24): Given the patient's age of greater than 50 and the fracture site (hip, distal radius, or spine), the patient should be tested for osteoporosis using DXA, and the appropriate treatment considered based on the DXA results.  Original Report Authenticated By:  Natasha Mead, M.D.   Ct Head Wo Contrast  12/02/2011  *RADIOLOGY REPORT*  Clinical Data: Larey Seat.  Hit head.  CT HEAD WITHOUT CONTRAST  Technique:  Contiguous axial images were obtained from the base of the skull through the vertex without contrast.  Comparison: None  Findings: There is a right occipital scalp hematoma without underlying skull fracture or radiopaque foreign body.  There is age related cerebral atrophy, ventriculomegaly and periventricular white matter disease.  No extra-axial fluid collections.  No CT findings for hemispheric infarction and/or intracranial hemorrhage.  The brainstem and cerebellum grossly normal.  The bony structures are intact.  The paranasal sinuses mastoid air cells are clear.  IMPRESSION:  1.  Age related cerebral atrophy, ventriculomegaly and periventricular white matter disease. No acute intracranial findings. 2.  Right occipital scalp hematoma without underlying skull fracture.  Original Report Authenticated By: P. Loralie Champagne, M.D.    Assessment/Plan  78 year old female which appears to be younger than his stated age and overall in fairly good health presents with right hip fracture and is setting up osteopenia. Due to mechanical fall   Present on Admission:  .Closed right hip fracture - as per hip admit orders. Dr. Berton Lan had seen the patient in emergency department and planned to operate either tomorrow or the day after tomorrow. Given patient's age she is probably moderate risk for surgery but  at this point she does not require any further cardiac workup prior to proceeding given no recent symptoms and no prior cardiac history.  Marland KitchenHTN (hypertension) -continue home medications Will avoid. Peri-perative beta blocker as patient heartrate already in 60s  .Hypothyroidism -continue home medications  .Dehydration patient's creatinine is slightly increased we'll give IV fluids and follow. Will encourage by mouth intake until orthopedics  Decided to the time of  operation.    otherwise plan as per orders  Prophylaxis: SCD  prior to OR. Post OR Lovenox  CODE STATUS: patient wishes to BE DO NOT RESUSCITATE DO NOT INTUBATE I did explain to her that for purposes of anesthesia she may require intubation   I have spent a total of  65 min on this admission spoke to consultant. Spoke on the phone with family regarding patient care. Questions answered.  Sabrin Dunlevy 12/02/2011,  10:09 PM

## 2011-12-02 NOTE — ED Notes (Signed)
Patient transported to X-ray 

## 2011-12-03 DIAGNOSIS — E860 Dehydration: Secondary | ICD-10-CM

## 2011-12-03 DIAGNOSIS — S72009A Fracture of unspecified part of neck of unspecified femur, initial encounter for closed fracture: Secondary | ICD-10-CM

## 2011-12-03 DIAGNOSIS — I10 Essential (primary) hypertension: Secondary | ICD-10-CM

## 2011-12-03 LAB — BASIC METABOLIC PANEL
CO2: 28 mEq/L (ref 19–32)
Calcium: 8.4 mg/dL (ref 8.4–10.5)
Creatinine, Ser: 1.08 mg/dL (ref 0.50–1.10)
GFR calc Af Amer: 49 mL/min — ABNORMAL LOW (ref >90)
GFR calc non Af Amer: 42 mL/min — ABNORMAL LOW (ref >90)
Sodium: 137 mEq/L (ref 135–145)

## 2011-12-03 LAB — CBC
MCH: 28.3 pg (ref 26.0–34.0)
Platelets: 122 10*3/uL — ABNORMAL LOW (ref 150–400)
RBC: 3.74 MIL/uL — ABNORMAL LOW (ref 3.87–5.11)
RDW: 14.3 % (ref 11.5–15.5)

## 2011-12-03 LAB — URINE CULTURE: Colony Count: NO GROWTH

## 2011-12-03 MED ORDER — HYDROCODONE-ACETAMINOPHEN 5-325 MG PO TABS
1.0000 | ORAL_TABLET | Freq: Four times a day (QID) | ORAL | Status: DC | PRN
  Administered 2011-12-03: 2 via ORAL
  Administered 2011-12-03: 1 via ORAL
  Filled 2011-12-03: qty 2
  Filled 2011-12-03: qty 1

## 2011-12-03 MED ORDER — METHOCARBAMOL 100 MG/ML IJ SOLN
500.0000 mg | Freq: Four times a day (QID) | INTRAVENOUS | Status: DC | PRN
  Filled 2011-12-03: qty 5

## 2011-12-03 MED ORDER — AMLODIPINE BESYLATE 5 MG PO TABS
5.0000 mg | ORAL_TABLET | Freq: Every day | ORAL | Status: DC
  Administered 2011-12-03 – 2011-12-04 (×2): 5 mg via ORAL
  Filled 2011-12-03 (×2): qty 1

## 2011-12-03 MED ORDER — DONEPEZIL HCL 10 MG PO TABS
10.0000 mg | ORAL_TABLET | Freq: Every day | ORAL | Status: DC
  Administered 2011-12-04 – 2011-12-06 (×3): 10 mg via ORAL
  Filled 2011-12-03 (×5): qty 1

## 2011-12-03 MED ORDER — ASPIRIN 81 MG PO CHEW
81.0000 mg | CHEWABLE_TABLET | Freq: Every day | ORAL | Status: DC
  Administered 2011-12-03 – 2011-12-07 (×5): 81 mg via ORAL
  Filled 2011-12-03 (×5): qty 1

## 2011-12-03 MED ORDER — HYDRALAZINE HCL 20 MG/ML IJ SOLN
10.0000 mg | INTRAMUSCULAR | Status: DC | PRN
  Filled 2011-12-03: qty 0.5

## 2011-12-03 MED ORDER — LEVOTHYROXINE SODIUM 75 MCG PO TABS
75.0000 ug | ORAL_TABLET | Freq: Every day | ORAL | Status: DC
  Administered 2011-12-03 – 2011-12-07 (×5): 75 ug via ORAL
  Filled 2011-12-03 (×5): qty 1

## 2011-12-03 MED ORDER — CITALOPRAM HYDROBROMIDE 40 MG PO TABS
40.0000 mg | ORAL_TABLET | Freq: Every day | ORAL | Status: DC
  Administered 2011-12-03 – 2011-12-07 (×5): 40 mg via ORAL
  Filled 2011-12-03 (×5): qty 1

## 2011-12-03 MED ORDER — METHOCARBAMOL 500 MG PO TABS: 500.0000 mg | ORAL_TABLET | Freq: Four times a day (QID) | ORAL | Status: DC | PRN

## 2011-12-03 MED ORDER — ENOXAPARIN SODIUM 40 MG/0.4ML ~~LOC~~ SOLN
40.0000 mg | SUBCUTANEOUS | Status: AC
  Administered 2011-12-03: 40 mg via SUBCUTANEOUS
  Filled 2011-12-03: qty 0.4

## 2011-12-03 MED ORDER — DOCUSATE SODIUM 100 MG PO CAPS
100.0000 mg | ORAL_CAPSULE | Freq: Two times a day (BID) | ORAL | Status: DC
  Administered 2011-12-03 – 2011-12-04 (×2): 100 mg via ORAL
  Filled 2011-12-03 (×4): qty 1

## 2011-12-03 NOTE — Clinical Social Work Placement (Unsigned)
     Clinical Social Work Department CLINICAL SOCIAL WORK PLACEMENT NOTE 12/03/2011  Patient:  Jasmin Rodriguez, Jasmin Rodriguez  Account Number:  [id number] Admit date:  12/02/2011  Clinical Social Worker:  Becky Sax, LCSW  Date/time:  12/03/2011 12:00 M  Clinical Social Work is seeking post-discharge placement for this patient at the following level of care:   SKILLED NURSING   (*CSW will update this form in Epic as items are completed)   12/03/2011  Patient/family provided with [facility] Department of Clinical Social Works list of facilities offering this level of care within the geographic area requested by the patient (or if unable, by the patients family).  12/03/2011  Patient/family informed of their freedom to choose among providers that offer the needed level of care, that participate in Medicare, Medicaid or managed care program needed by the patient, have an available bed and are willing to accept the patient.  12/03/2011  Patient/family informed of [facility] ownership interest in [facility], as well as of the fact that they are under no obligation to receive care at this facility.  PASARR submitted to EDS on 12/03/2011 PASARR number received from EDS on 12/03/2011  FL2 transmitted to all facilities in geographic area requested by pt/family on  12/03/2011 FL2 transmitted to all facilities within larger geographic area on   Patient informed that his/her managed care company has contracts with or will negotiate with  certain facilities, including the following:     Patient/family informed of bed offers received:  12/03/2011 Patient chooses bed at [facility] Physician recommends and patient chooses bed at    Patient to be transferred to  on   Patient to be transferred to facility by   The following physician request were entered in Epic:   Additional Comments:

## 2011-12-03 NOTE — Progress Notes (Signed)
Subjective: Jasmin Rodriguez is a 78 year old female who lives independently at [facility] and fell tonight in her living room. She denies, dizziness, light headedness, chest pain, SOB, palpitations, or other systemic symptoms associated with the fall. She feels as though she may have tripped on something leading to the fall. She developed immediate right hip pain and inability to stand up. She has a history of a left intertrochanteric femur fracture treated by Dr. Charlann Boxer several years ago. Radiographs show a displaced intertrochanteric fracture of the right proximal femur.  On rounds this morning, patient states that she is doing OK. She is not in pain unless she moves a certain way.  I briefly discussed the treatment of surgery and she states that "I am not ready to go into surgery."  She does not want to set up surgical plan at this time.  She states that she does not feel well or has the "energy" to go through surgery at this time.  We will start her on Lovenox for prophylaxis at this time.  I will start today but hold tomorrows dose just in case a decision is made to proceed tomorrow.  Dr. Lequita Halt is in surgery all day tomorrow and will have time to surgical fix the fracture but the patient does not want to proceed with making a decision at this time.  Objective: Vital signs in last 24 hours: Temp:  [97.9 F (36.6 C)-98.6 F (37 C)] 98.1 F (36.7 C) (06/06 1019) Pulse Rate:  [53-82] 53  (06/06 1019) Resp:  [12-24] 20  (06/06 1019) BP: (128-193)/(41-82) 160/65 mmHg (06/06 1019) SpO2:  [86 %-100 %] 100 % (06/06 1019) Weight:  [55.1 kg (121 lb 7.6 oz)] 55.1 kg (121 lb 7.6 oz) (06/06 0027)  Intake/Output from previous day: 06/05 0701 - 06/06 0700 In: 486.3 [I.V.:486.3] Out: 900 [Urine:900] Intake/Output this shift: Total I/O In: 240 [P.O.:240] Out: -    Basename 12/03/11 0459 12/02/11 1855  HGB 10.6* 12.0    Basename 12/03/11 0459 12/02/11 1855  WBC 9.8 9.7  RBC 3.74* 4.23  HCT  32.4* 36.5  PLT 122* 168    Basename 12/03/11 0459 12/02/11 1855  NA 137 135  K 3.7 3.5  CL 100 98  CO2 28 25  BUN 21 25*  CREATININE 1.08 1.23*  GLUCOSE 113* 132*  CALCIUM 8.4 9.1    Basename 12/02/11 1855  LABPT --  INR 1.09   Exam Extremities - peripheral pulses normal, no pedal edema, no clubbing or cyanosis  Right lower extremity shortened and externally rotated. Tender right hip area. Neurovascular intact RLE with 2+ DP pulse.  Assessment/Plan: DVT Prophylaxis - Lovenox 40 mg injection daily, but will hold tomorrows dose just in case surgery is decided upon. PAS in place.  Pain Management - Norco PO, Robaxin PO or IV, Morphine IV  Activity - Bedrest  Diet - Currently in Heart Healthy.  Will change to clear liquids for tomorrow AM and the NPO after that in anticipation of possible surgery.  If no surgery tomorrow, then will go back to [facility] Healthy.  No consent has been ordered.  Jasmin Rodriguez 12/03/2011, 11:21 AM

## 2011-12-03 NOTE — Clinical Social Work Psychosocial (Unsigned)
     Clinical Social Work Department BRIEF PSYCHOSOCIAL ASSESSMENT 12/03/2011  Patient:  Jasmin Rodriguez, Jasmin Rodriguez     Account Number:  [id number]     Admit date:  12/02/2011  Clinical Social Worker:  Hattie Perch  Date/Time:  12/03/2011 12:00 M  Referred by:  Physician  Date Referred:  12/03/2011 Referred for  SNF Placement   Other Referral:   Interview type:  Patient Other interview type:    PSYCHOSOCIAL DATA Living Status:  FACILITY Admitted from facility:  [facility] Level of care:  Assisted Living Primary support name:  Blima Ledger Primary support relationship to patient:  CHILD, ADULT Degree of support available:   good    CURRENT CONCERNS Current Concerns  Post-Acute Placement   Other Concerns:    SOCIAL WORK ASSESSMENT / PLAN CSW met with patient. patient is alert and oriented x3. discussed need for snf. patient is from the assisted living facility at [facility]. patient states that she will go to snf at [facility].   Assessment/plan status:   Other assessment/ plan:   Information/referral to community resources:    PATIENTS/FAMILYS RESPONSE TO PLAN OF CARE: patient agreeable to snf at [facility] upon discharge.

## 2011-12-03 NOTE — Progress Notes (Signed)
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 I cannot say enough about how she  touched people with her positive, can do attitude. When asked about what brought her here, she said "old age." We joked about how she tried to run from that for years and she thinks it finally caught her. Will continue to support.   12/03/11 0900  Clinical Encounter Type  Visited With Patient  Visit Type Spiritual support;Social support  Referral From Nurse;Patient  Recommendations Follow up  Spiritual Encounters  Spiritual Needs Emotional  Stress Factors  Patient Stress Factors Major life changes

## 2011-12-03 NOTE — Progress Notes (Signed)
Subjective: Comfortable. Not in acute pain.  Objective: Vital signs in last 24 hours: Temp:  [97.9 F (36.6 C)-98.6 F (37 C)] 98.3 F (36.8 C) (06/06 0853) Pulse Rate:  [54-82] 56  (06/06 0853) Resp:  [12-24] 18  (06/06 0853) BP: (128-193)/(41-82) 154/68 mmHg (06/06 0853) SpO2:  [86 %-100 %] 100 % (06/06 0853) Weight:  [55.1 kg (121 lb 7.6 oz)] 55.1 kg (121 lb 7.6 oz) (06/06 0027) Weight change:  Last BM Date: 12/02/11  Intake/Output from previous day: 06/05 0701 - 06/06 0700 In: 486.3 [I.V.:486.3] Out: 900 [Urine:900]     Physical Exam: General: Comfortable, alert, eating, communicative, oriented, not short of breath at rest.  HEENT:  Mild clinical pallor, no jaundice, no conjunctival injection or discharge. Hydration status is fair. NECK:  Supple, JVP not seen, no carotid bruits, no palpable lymphadenopathy, no palpable goiter. CHEST:  Clinically clear to auscultation, no wheezes, no crackles. HEART:  Sounds 1 and 2 heard, normal, regular, no murmurs. ABDOMEN:  Full, soft, non-tender, no palpable organomegaly, no palpable masses, normal bowel sounds. GENITALIA:  Not examined. LOWER EXTREMITIES:  No pitting edema, palpable peripheral pulses.b has swelling right hip, but no obvious bruising. MUSCULOSKELETAL SYSTEM:  Generalized osteoarthritic changes, and slight shortening of right LE. CENTRAL NERVOUS SYSTEM:  No focal neurologic deficit on gross examination.  Lab Results:  Basename 12/03/11 0459 12/02/11 1855  WBC 9.8 9.7  HGB 10.6* 12.0  HCT 32.4* 36.5  PLT 122* 168    Basename 12/03/11 0459 12/02/11 1855  NA 137 135  K 3.7 3.5  CL 100 98  CO2 28 25  GLUCOSE 113* 132*  BUN 21 25*  CREATININE 1.08 1.23*  CALCIUM 8.4 9.1   No results found for this or any previous visit (from the past 240 hour(s)).   Studies/Results: Dg Chest 1 View  12/02/2011  *RADIOLOGY REPORT*  Clinical Data: Fall, hip pain  CHEST - 1 VIEW  Comparison: 10/21/2009  Findings: Cardiomegaly  again noted.  No acute infiltrate or pleural effusion.  No pulmonary edema.  Stable calcified granuloma right lower lobe.  IMPRESSION: No active disease.  No significant change.  Original Report Authenticated By: Natasha Mead, M.D.   Dg Hip Complete Right  12/02/2011  *RADIOLOGY REPORT*  Clinical Data: Fall, right hip pain  RIGHT HIP - COMPLETE 2+ VIEW  Comparison: 10/21/2009.  Findings: Three views of the right hip submitted.  There is displaced intertrochanteric fracture of the right proximal femur. Metallic fixation pin and metallic rod noted in proximal left femur.  Osteopenia is noted.  Degenerative changes lumbar spine. A subtrochanteric vertical fracture line is extending in the proximal femoral shaft right femur.  IMPRESSION: Displaced intertrochanteric fracture of the right proximal femur. There is diffuse osteopenia.  Per CMS PQRS reporting requirements (PQRS Measure 24): Given the patient's age of greater than 50 and the fracture site (hip, distal radius, or spine), the patient should be tested for osteoporosis using DXA, and the appropriate treatment considered based on the DXA results.  Original Report Authenticated By: Natasha Mead, M.D.   Ct Head Wo Contrast  12/02/2011  *RADIOLOGY REPORT*  Clinical Data: Larey Seat.  Hit head.  CT HEAD WITHOUT CONTRAST  Technique:  Contiguous axial images were obtained from the base of the skull through the vertex without contrast.  Comparison: None  Findings: There is a right occipital scalp hematoma without underlying skull fracture or radiopaque foreign body.  There is age related cerebral atrophy, ventriculomegaly and periventricular white matter disease.  No extra-axial fluid collections.  No CT findings for hemispheric infarction and/or intracranial hemorrhage.  The brainstem and cerebellum grossly normal.  The bony structures are intact.  The paranasal sinuses mastoid air cells are clear.  IMPRESSION:  1.  Age related cerebral atrophy, ventriculomegaly and  periventricular white matter disease. No acute intracranial findings. 2.  Right occipital scalp hematoma without underlying skull fracture.  Original Report Authenticated By: P. Loralie Champagne, M.D.    Medications: Scheduled Meds:   . amLODipine  5 mg Oral Daily  . aspirin  81 mg Oral Daily  . citalopram  40 mg Oral Daily  . docusate sodium  100 mg Oral BID  . donepezil  10 mg Oral QHS  . fentaNYL  50 mcg Intravenous Once  . levothyroxine  75 mcg Oral Daily  . ondansetron (ZOFRAN) IV  4 mg Intravenous Once  . DISCONTD: sodium chloride  1,000 mL Intravenous Once   Continuous Infusions:   . sodium chloride 1,000 mL (12/02/11 2331)  . DISCONTD: sodium chloride 125 mL/hr at 12/02/11 1844  . DISCONTD: sodium chloride     PRN Meds:.hydrALAZINE, HYDROcodone-acetaminophen, methocarbamol (ROBAXIN) IV, methocarbamol, morphine injection  Assessment/Plan:  Active Problems 1. Closed right hip fracture: This appears due to a mechanical fall. Orthopedic consultation was provided by Dr. Loel Ro. Surgery is contemplated. Given patient's age she is at moderate risk for surgery but at this point she does not require any further cardiac workup. She has no prior cardiac history, and has no symptoms of exertional dyspnea or angina.  2. Head trauma: Patient has a small occipital scalp hematoma, caused by fall. Fortunately, there is no evidence of skull fracture or intracranial injury.  3. HTN (hypertension): BP is mildly elevated this AM, but response to pre-admission antihypertensives, is anticipated. Will observe, and address as indicated.  4. Hypothyroidism: Continued on thyroxine replacement therapy.  5. Mild Dehydration:  Patient presented with mildly elevated BUN/Creatinine ratio. This has already improved overnight, on iv fluids.  6. Anemia: This is mild, and probably due to acute blood loss. However, HB is reasonable at this time. Will follow CBC. 8. Dementia: Mild/Stable. Will continue  pre-admission psychotropics.  Comment: Will defer pain management and DVT prophylaxis, to the orthopedic team.   LOS: 1 day   Alberta Cairns,CHRISTOPHER 12/03/2011, 10:00 AM

## 2011-12-04 ENCOUNTER — Encounter (HOSPITAL_COMMUNITY): Payer: Self-pay | Admitting: Anesthesiology

## 2011-12-04 ENCOUNTER — Inpatient Hospital Stay (HOSPITAL_COMMUNITY): Payer: Medicare Other

## 2011-12-04 ENCOUNTER — Encounter (HOSPITAL_COMMUNITY)
Admission: EM | Disposition: A | Discharge status: Skilled Nursing Facility | Payer: Self-pay | Source: Home / Self Care | Attending: Internal Medicine

## 2011-12-04 ENCOUNTER — Inpatient Hospital Stay (HOSPITAL_COMMUNITY): Payer: Medicare Other | Admitting: Anesthesiology

## 2011-12-04 DIAGNOSIS — S72009A Fracture of unspecified part of neck of unspecified femur, initial encounter for closed fracture: Secondary | ICD-10-CM

## 2011-12-04 DIAGNOSIS — E860 Dehydration: Secondary | ICD-10-CM

## 2011-12-04 DIAGNOSIS — I10 Essential (primary) hypertension: Secondary | ICD-10-CM

## 2011-12-04 LAB — CBC
MCV: 88.5 fL (ref 78.0–100.0)
Platelets: 100 10*3/uL — ABNORMAL LOW (ref 150–400)
RBC: 3.38 MIL/uL — ABNORMAL LOW (ref 3.87–5.11)
RDW: 14.5 % (ref 11.5–15.5)
WBC: 9.2 10*3/uL (ref 4.0–10.5)

## 2011-12-04 LAB — BASIC METABOLIC PANEL
CO2: 26 mEq/L (ref 19–32)
Chloride: 103 mEq/L (ref 96–112)
GFR calc Af Amer: 48 mL/min — ABNORMAL LOW (ref >90)
Potassium: 3.3 mEq/L — ABNORMAL LOW (ref 3.5–5.1)
Sodium: 137 mEq/L (ref 135–145)

## 2011-12-04 LAB — SURGICAL PCR SCREEN
MRSA, PCR: NEGATIVE
Staphylococcus aureus: NEGATIVE

## 2011-12-04 SURGERY — INSERTION, INTRAMEDULLARY ROD, FEMUR
Anesthesia: General | Site: Hip | Laterality: Right | Wound class: Clean

## 2011-12-04 MED ORDER — LACTATED RINGERS IV SOLN
INTRAVENOUS | Status: DC
  Administered 2011-12-04: 20:00:00 via INTRAVENOUS

## 2011-12-04 MED ORDER — ENOXAPARIN SODIUM 40 MG/0.4ML ~~LOC~~ SOLN
40.0000 mg | SUBCUTANEOUS | Status: DC
  Administered 2011-12-05 – 2011-12-07 (×3): 40 mg via SUBCUTANEOUS
  Filled 2011-12-04 (×5): qty 0.4

## 2011-12-04 MED ORDER — PHENOL 1.4 % MT LIQD: 1.0000 | OROMUCOSAL | Status: DC | PRN

## 2011-12-04 MED ORDER — POTASSIUM CHLORIDE CRYS ER 20 MEQ PO TBCR
40.0000 meq | EXTENDED_RELEASE_TABLET | Freq: Every day | ORAL | Status: DC
  Administered 2011-12-05 – 2011-12-07 (×3): 40 meq via ORAL
  Filled 2011-12-04 (×4): qty 2

## 2011-12-04 MED ORDER — LIDOCAINE HCL 1 % IJ SOLN
INTRAMUSCULAR | Status: DC | PRN
  Administered 2011-12-04: 40 mg via INTRADERMAL

## 2011-12-04 MED ORDER — ACETAMINOPHEN 650 MG RE SUPP: 650.0000 mg | Freq: Four times a day (QID) | RECTAL | Status: DC | PRN

## 2011-12-04 MED ORDER — SUCCINYLCHOLINE CHLORIDE 20 MG/ML IJ SOLN
INTRAMUSCULAR | Status: DC | PRN
  Administered 2011-12-04: 100 mg via INTRAVENOUS

## 2011-12-04 MED ORDER — ACETAMINOPHEN 325 MG PO TABS: 650.0000 mg | ORAL_TABLET | Freq: Four times a day (QID) | ORAL | Status: DC | PRN

## 2011-12-04 MED ORDER — ONDANSETRON HCL 4 MG/2ML IJ SOLN
INTRAMUSCULAR | Status: DC | PRN
  Administered 2011-12-04: 4 mg via INTRAVENOUS

## 2011-12-04 MED ORDER — CEFAZOLIN SODIUM 1-5 GM-% IV SOLN
INTRAVENOUS | Status: DC | PRN
  Administered 2011-12-04: 1 g via INTRAVENOUS

## 2011-12-04 MED ORDER — MENTHOL 3 MG MT LOZG: 1.0000 | LOZENGE | OROMUCOSAL | Status: DC | PRN

## 2011-12-04 MED ORDER — BISACODYL 10 MG RE SUPP: 10.0000 mg | Freq: Every day | RECTAL | Status: DC | PRN

## 2011-12-04 MED ORDER — FENTANYL CITRATE 0.05 MG/ML IJ SOLN
INTRAMUSCULAR | Status: DC | PRN
  Administered 2011-12-04 (×2): 50 ug via INTRAVENOUS

## 2011-12-04 MED ORDER — PROPOFOL 10 MG/ML IV BOLUS
INTRAVENOUS | Status: DC | PRN
  Administered 2011-12-04: 100 mg via INTRAVENOUS

## 2011-12-04 MED ORDER — AMLODIPINE BESYLATE 5 MG PO TABS
5.0000 mg | ORAL_TABLET | Freq: Once | ORAL | Status: AC
  Administered 2011-12-04: 5 mg via ORAL
  Filled 2011-12-04: qty 1

## 2011-12-04 MED ORDER — AMLODIPINE BESYLATE 10 MG PO TABS
10.0000 mg | ORAL_TABLET | Freq: Every day | ORAL | Status: DC
  Administered 2011-12-05 – 2011-12-07 (×3): 10 mg via ORAL
  Filled 2011-12-04 (×3): qty 1

## 2011-12-04 MED ORDER — CEFAZOLIN SODIUM-DEXTROSE 2-3 GM-% IV SOLR
2.0000 g | Freq: Four times a day (QID) | INTRAVENOUS | Status: AC
  Administered 2011-12-04 – 2011-12-05 (×2): 2 g via INTRAVENOUS
  Filled 2011-12-04 (×2): qty 50

## 2011-12-04 MED ORDER — HYDROMORPHONE HCL PF 1 MG/ML IJ SOLN: 0.2500 mg | INTRAMUSCULAR | Status: DC | PRN

## 2011-12-04 MED ORDER — LACTATED RINGERS IV SOLN
INTRAVENOUS | Status: DC
  Administered 2011-12-04: 17:00:00 via INTRAVENOUS

## 2011-12-04 MED ORDER — ONDANSETRON HCL 4 MG PO TABS: 4.0000 mg | ORAL_TABLET | Freq: Four times a day (QID) | ORAL | Status: DC | PRN

## 2011-12-04 MED ORDER — MORPHINE SULFATE 2 MG/ML IJ SOLN: 0.5000 mg | INTRAMUSCULAR | Status: DC | PRN

## 2011-12-04 MED ORDER — ONDANSETRON HCL 4 MG/2ML IJ SOLN: 4.0000 mg | Freq: Four times a day (QID) | INTRAMUSCULAR | Status: DC | PRN

## 2011-12-04 MED ORDER — DOCUSATE SODIUM 100 MG PO CAPS
100.0000 mg | ORAL_CAPSULE | Freq: Two times a day (BID) | ORAL | Status: DC
  Administered 2011-12-04 – 2011-12-07 (×6): 100 mg via ORAL
  Filled 2011-12-04 (×5): qty 1

## 2011-12-04 MED ORDER — SODIUM CHLORIDE 0.9 % IV SOLN
INTRAVENOUS | Status: DC
  Administered 2011-12-04 – 2011-12-05 (×2): via INTRAVENOUS

## 2011-12-04 MED ORDER — METOCLOPRAMIDE HCL 10 MG PO TABS: 5.0000 mg | ORAL_TABLET | Freq: Three times a day (TID) | ORAL | Status: DC | PRN

## 2011-12-04 MED ORDER — FLEET ENEMA 7-19 GM/118ML RE ENEM: 1.0000 | ENEMA | Freq: Once | RECTAL | Status: AC | PRN

## 2011-12-04 MED ORDER — OXYCODONE HCL 5 MG PO TABS
5.0000 mg | ORAL_TABLET | ORAL | Status: DC | PRN
  Administered 2011-12-05 – 2011-12-07 (×4): 5 mg via ORAL
  Filled 2011-12-04 (×4): qty 1

## 2011-12-04 MED ORDER — POLYETHYLENE GLYCOL 3350 17 G PO PACK
17.0000 g | PACK | Freq: Every day | ORAL | Status: DC | PRN
  Filled 2011-12-04: qty 1

## 2011-12-04 MED ORDER — ATROPINE SULFATE 0.4 MG/ML IJ SOLN
INTRAMUSCULAR | Status: DC | PRN
  Administered 2011-12-04: 0.4 mg via INTRAVENOUS

## 2011-12-04 MED ORDER — METOCLOPRAMIDE HCL 5 MG/ML IJ SOLN: 5.0000 mg | Freq: Three times a day (TID) | INTRAMUSCULAR | Status: DC | PRN

## 2011-12-04 MED ORDER — DEXAMETHASONE SODIUM PHOSPHATE 10 MG/ML IJ SOLN
INTRAMUSCULAR | Status: DC | PRN
  Administered 2011-12-04: 10 mg via INTRAVENOUS

## 2011-12-04 NOTE — Transfer of Care (Signed)
Immediate Anesthesia Transfer of Care Note  Patient: Jasmin Rodriguez  Procedure(s) Performed: Procedure(s) (LRB): INTRAMEDULLARY (IM) NAIL FEMORAL (Right)  Patient Location: PACU  Anesthesia Type: General  Level of Consciousness: awake and sedated  Airway & Oxygen Therapy: Patient Spontanous Breathing and Patient connected to face mask oxygen  Post-op Assessment: Report given to PACU RN and Post -op Vital signs reviewed and stable  Post vital signs: Reviewed and stable  Complications: No apparent anesthesia complications

## 2011-12-04 NOTE — Anesthesia Postprocedure Evaluation (Addendum)
  Anesthesia Post-op Note  Patient: Jasmin Rodriguez  Procedure(s) Performed: Procedure(s) (LRB): INTRAMEDULLARY (IM) NAIL FEMORAL (Right)  Patient Location: PACU  Anesthesia Type: General  Level of Consciousness: awake and alert   Airway and Oxygen Therapy: Patient Spontanous Breathing  Post-op Pain: mild  Post-op Assessment: Post-op Vital signs reviewed, Patient's Cardiovascular Status Stable, Respiratory Function Stable, Patent Airway and No signs of Nausea or vomiting  Post-op Vital Signs: stable  Complications: No apparent anesthesia complications

## 2011-12-04 NOTE — Preoperative (Signed)
Beta Blockers   Reason not to administer Beta Blockers:Not Applicable 

## 2011-12-04 NOTE — Progress Notes (Signed)
Jasmin Rodriguez is a 78 y.o. female patient.  Has decided to proceed with hip surgery today  1. Hip fracture   2. Head injuries     Past Medical History  Diagnosis Date  . Hypertension   . Anemia   . Thyroid disease   . Hyperlipidemia   . Osteopenia   . Radial fracture   . Femur fracture     Current Facility-Administered Medications  Medication Dose Route Frequency Provider Last Rate Last Dose  . 0.9 %  sodium chloride infusion   Intravenous Continuous Anastassia Doutova, MD 75 mL/hr at 12/03/11 1219    . amLODipine (NORVASC) tablet 5 mg  5 mg Oral Daily Anastassia Doutova, MD   5 mg at 12/03/11 0923  . aspirin chewable tablet 81 mg  81 mg Oral Daily Anastassia Doutova, MD   81 mg at 12/03/11 0923  . citalopram (CELEXA) tablet 40 mg  40 mg Oral Daily Anastassia Doutova, MD   40 mg at 12/03/11 0923  . docusate sodium (COLACE) capsule 100 mg  100 mg Oral BID Anastassia Doutova, MD   100 mg at 12/03/11 0923  . donepezil (ARICEPT) tablet 10 mg  10 mg Oral QHS Anastassia Doutova, MD      . enoxaparin (LOVENOX) injection 40 mg  40 mg Subcutaneous Q24H Alexzandrew Perkins, PA   40 mg at 12/03/11 1216  . hydrALAZINE (APRESOLINE) injection 10 mg  10 mg Intravenous Q4H PRN Anastassia Doutova, MD      . HYDROcodone-acetaminophen (NORCO) 5-325 MG per tablet 1-2 tablet  1-2 tablet Oral Q6H PRN Anastassia Doutova, MD   2 tablet at 12/03/11 1437  . levothyroxine (SYNTHROID, LEVOTHROID) tablet 75 mcg  75 mcg Oral Daily Anastassia Doutova, MD   75 mcg at 12/03/11 0923  . methocarbamol (ROBAXIN) tablet 500 mg  500 mg Oral Q6H PRN Anastassia Doutova, MD       Or  . methocarbamol (ROBAXIN) 500 mg in dextrose 5 % 50 mL IVPB  500 mg Intravenous Q6H PRN Anastassia Doutova, MD      . morphine 2 MG/ML injection 0.5 mg  0.5 mg Intravenous Q2H PRN Anastassia Doutova, MD   0.5 mg at 12/02/11 2328   Allergies  Allergen Reactions  . Tramadol   . Zoloft (Sertraline Hcl)    Principal Problem:  *Closed  right hip fracture Active Problems:  HTN (hypertension)  Hypothyroidism  Dehydration  Blood pressure 186/74, pulse 61, temperature 98.6 F (37 C), temperature source Oral, resp. rate 18, height 5' 3" (1.6 m), weight 55.1 kg (121 lb 7.6 oz), SpO2 96.00%.  ROS  Physical Exam RLE still shortened and externally rotated  Plan proceed with IM nailing right femur this afternoon  [name] V 12/04/2011 

## 2011-12-04 NOTE — H&P (View-Only) (Signed)
Jasmin Rodriguez is a 78 y.o. female patient.  Has decided to proceed with hip surgery today  1. Hip fracture   2. Head injuries     Past Medical History  Diagnosis Date  . Hypertension   . Anemia   . Thyroid disease   . Hyperlipidemia   . Osteopenia   . Radial fracture   . Femur fracture     Current Facility-Administered Medications  Medication Dose Route Frequency Provider Last Rate Last Dose  . 0.9 %  sodium chloride infusion   Intravenous Continuous Therisa Doyne, MD 75 mL/hr at 12/03/11 1219    . amLODipine (NORVASC) tablet 5 mg  5 mg Oral Daily Therisa Doyne, MD   5 mg at 12/03/11 0923  . aspirin chewable tablet 81 mg  81 mg Oral Daily Therisa Doyne, MD   81 mg at 12/03/11 0923  . citalopram (CELEXA) tablet 40 mg  40 mg Oral Daily Therisa Doyne, MD   40 mg at 12/03/11 0923  . docusate sodium (COLACE) capsule 100 mg  100 mg Oral BID Therisa Doyne, MD   100 mg at 12/03/11 0923  . donepezil (ARICEPT) tablet 10 mg  10 mg Oral QHS Therisa Doyne, MD      . enoxaparin (LOVENOX) injection 40 mg  40 mg Subcutaneous Q24H Alexzandrew Perkins, PA   40 mg at 12/03/11 1216  . hydrALAZINE (APRESOLINE) injection 10 mg  10 mg Intravenous Q4H PRN Therisa Doyne, MD      . HYDROcodone-acetaminophen (NORCO) 5-325 MG per tablet 1-2 tablet  1-2 tablet Oral Q6H PRN Therisa Doyne, MD   2 tablet at 12/03/11 1437  . levothyroxine (SYNTHROID, LEVOTHROID) tablet 75 mcg  75 mcg Oral Daily Therisa Doyne, MD   75 mcg at 12/03/11 0923  . methocarbamol (ROBAXIN) tablet 500 mg  500 mg Oral Q6H PRN Therisa Doyne, MD       Or  . methocarbamol (ROBAXIN) 500 mg in dextrose 5 % 50 mL IVPB  500 mg Intravenous Q6H PRN Therisa Doyne, MD      . morphine 2 MG/ML injection 0.5 mg  0.5 mg Intravenous Q2H PRN Therisa Doyne, MD   0.5 mg at 12/02/11 2328   Allergies  Allergen Reactions  . Tramadol   . Zoloft (Sertraline Hcl)    Principal Problem:  *Closed  right hip fracture Active Problems:  HTN (hypertension)  Hypothyroidism  Dehydration  Blood pressure 186/74, pulse 61, temperature 98.6 F (37 C), temperature source Oral, resp. rate 18, height 5\' 3"  (1.6 m), weight 55.1 kg (121 lb 7.6 oz), SpO2 96.00%.  ROS  Physical Exam RLE still shortened and externally rotated  Plan proceed with IM nailing right femur this afternoon  [name] 12/04/2011

## 2011-12-04 NOTE — Progress Notes (Signed)
CARE MANAGEMENT NOTE 12/04/2011  Patient:  Jasmin Rodriguez, Jasmin Rodriguez   Account Number:  [id number]  Date Initiated:  12/04/2011  Documentation initiated by:  Philana Younis  Subjective/Objective Assessment:   pt felled at her apartment sustaining closed head injury and a fracture of the rt hip.  Patient confused once arrival and intercrainal bleed ruled out/pt to go to or for hip pinning [phone]     Action/Plan:   lives at an assisted living may need snf placement for rehab   Anticipated DC Date:  12/07/2011   Anticipated DC Plan:  SKILLED NURSING FACILITY  In-house referral  Clinical Social Worker      DC Planning Services  NA      [facility] Choice  NA   Choice offered to / List presented to:  NA   DME arranged  NA      DME agency  NA     HH arranged  NA      HH agency  NA   Status of service:  In process, will continue to follow Medicare Important Message given?  NA - LOS <3 / Initial given by admissions (If response is "NO", the following Medicare IM given date fields will be blank) Date Medicare IM given:   Date Additional Medicare IM given:    Discharge Disposition:    Per UR Regulation:  Reviewed for med. necessity/level of care/duration of stay  If discussed at Long Length of Stay Meetings, dates discussed:    Comments:  [id number] Marcelle Smiling, RN,BSN,CCM No discharge needs present at time of this review Case Management [phone]

## 2011-12-04 NOTE — Interval H&P Note (Signed)
History and Physical Interval Note:  12/04/2011 5:01 PM  Jasmin Rodriguez  has presented today for surgery, with the diagnosis of Right intertrochanteric   The various methods of treatment have been discussed with the patient and family. After consideration of risks, benefits and other options for treatment, the patient has consented to  Procedure(s) (LRB): INTRAMEDULLARY (IM) NAIL FEMORAL (Right) as a surgical intervention .  The patients' history has been reviewed, patient examined, no change in status, stable for surgery.  I have reviewed the patients' chart and labs.  Questions were answered to the patient's satisfaction.     [name]

## 2011-12-04 NOTE — Progress Notes (Signed)
Subjective: Comfortable. Not in acute pain.  Objective: Vital signs in last 24 hours: Temp:  [98.3 F (36.8 C)-98.7 F (37.1 C)] 98.3 F (36.8 C) (06/07 1025) Pulse Rate:  [56-61] 60  (06/07 1025) Resp:  [16-20] 20  (06/07 1025) BP: (148-186)/(52-74) 162/54 mmHg (06/07 1025) SpO2:  [92 %-100 %] 94 % (06/07 1025) Weight change:  Last BM Date: 12/02/11  Intake/Output from previous day: 06/06 0701 - 06/07 0700 In: 1260 [P.O.:360; I.V.:900] Out: 750 [Urine:750]     Physical Exam: General: Comfortable, alert, communicative, not short of breath at rest.  HEENT:  Mild clinical pallor, no jaundice, no conjunctival injection or discharge. Hydration status is fair. NECK:  Supple, JVP not seen, no carotid bruits, no palpable lymphadenopathy, no palpable goiter. CHEST:  Clinically clear to auscultation, no wheezes, no crackles. HEART:  Sounds 1 and 2 heard, normal, regular, no murmurs. ABDOMEN:  Full, soft, non-tender, no palpable organomegaly, no palpable masses, normal bowel sounds. GENITALIA:  Not examined. LOWER EXTREMITIES:  No pitting edema, palpable peripheral pulses.b has swelling right hip, but no obvious bruising. MUSCULOSKELETAL SYSTEM:  Generalized osteoarthritic changes, and slight shortening of right LE. CENTRAL NERVOUS SYSTEM:  No focal neurologic deficit on gross examination.  Lab Results:  [facility] 12/04/11 0450 12/03/11 0459  WBC 9.2 9.8  HGB 9.7* 10.6*  HCT 29.9* 32.4*  PLT 100* 122*    Basename 12/04/11 0450 12/03/11 0459  NA 137 137  K 3.3* 3.7  CL 103 100  CO2 26 28  GLUCOSE 102* 113*  BUN 18 21  CREATININE 1.09 1.08  CALCIUM 8.2* 8.4   Recent Results (from the past 240 hour(s))  URINE CULTURE     Status: Normal   Collection Time   12/02/11  7:39 PM      Component Value Range Status Comment   Specimen Description URINE, CATHETERIZED   Final    Special Requests NONE   Final    Culture  Setup Time [id number]   Final    Colony Count NO GROWTH    Final    Culture NO GROWTH   Final    Report Status 12/03/2011 FINAL   Final   SURGICAL PCR SCREEN     Status: Normal   Collection Time   12/04/11  7:11 AM      Component Value Range Status Comment   MRSA, PCR NEGATIVE  NEGATIVE  Final    Staphylococcus aureus NEGATIVE  NEGATIVE  Final      Studies/Results: Dg Chest 1 View  12/02/2011  *RADIOLOGY REPORT*  Clinical Data: Fall, hip pain  CHEST - 1 VIEW  Comparison: 10/21/2009  Findings: Cardiomegaly again noted.  No acute infiltrate or pleural effusion.  No pulmonary edema.  Stable calcified granuloma right lower lobe.  IMPRESSION: No active disease.  No significant change.  Original Report Authenticated By: Natasha Mead, M.D.   Dg Hip Complete Right  12/02/2011  *RADIOLOGY REPORT*  Clinical Data: Fall, right hip pain  RIGHT HIP - COMPLETE 2+ VIEW  Comparison: 10/21/2009.  Findings: Three views of the right hip submitted.  There is displaced intertrochanteric fracture of the right proximal femur. Metallic fixation pin and metallic rod noted in proximal left femur.  Osteopenia is noted.  Degenerative changes lumbar spine. A subtrochanteric vertical fracture line is extending in the proximal femoral shaft right femur.  IMPRESSION: Displaced intertrochanteric fracture of the right proximal femur. There is diffuse osteopenia.  Per CMS PQRS reporting requirements (PQRS Measure 24): Given the patient's age  of greater than 50 and the fracture site (hip, distal radius, or spine), the patient should be tested for osteoporosis using DXA, and the appropriate treatment considered based on the DXA results.  Original Report Authenticated By: Natasha Mead, M.D.   Ct Head Wo Contrast  12/02/2011  *RADIOLOGY REPORT*  Clinical Data: Larey Seat.  Hit head.  CT HEAD WITHOUT CONTRAST  Technique:  Contiguous axial images were obtained from the base of the skull through the vertex without contrast.  Comparison: None  Findings: There is a right occipital scalp hematoma without underlying  skull fracture or radiopaque foreign body.  There is age related cerebral atrophy, ventriculomegaly and periventricular white matter disease.  No extra-axial fluid collections.  No CT findings for hemispheric infarction and/or intracranial hemorrhage.  The brainstem and cerebellum grossly normal.  The bony structures are intact.  The paranasal sinuses mastoid air cells are clear.  IMPRESSION:  1.  Age related cerebral atrophy, ventriculomegaly and periventricular white matter disease. No acute intracranial findings. 2.  Right occipital scalp hematoma without underlying skull fracture.  Original Report Authenticated By: P. Loralie Champagne, M.D.    Medications: Scheduled Meds:    . amLODipine  5 mg Oral Daily  . aspirin  81 mg Oral Daily  . citalopram  40 mg Oral Daily  . docusate sodium  100 mg Oral BID  . donepezil  10 mg Oral QHS  . levothyroxine  75 mcg Oral Daily   Continuous Infusions:    . sodium chloride 75 mL/hr at 12/03/11 1219   PRN Meds:.hydrALAZINE, HYDROcodone-acetaminophen, methocarbamol (ROBAXIN) IV, methocarbamol, morphine injection  Assessment/Plan:  Active Problems 1. Closed right hip fracture: This appears due to a mechanical fall. Orthopedic consultation was provided by Dr. Loel Ro. Surgery is planned for today. Given patient's age she is at moderate risk for surgery but at this point she does not require any further cardiac workup. She has no prior cardiac history, and has no symptoms of exertional dyspnea or angina.  2. Head trauma: Patient has a small occipital scalp hematoma, caused by fall. Fortunately, there is no evidence of skull fracture or intracranial injury.  3. HTN (hypertension): BP is still elevated this AM. Have increased Norvasc to 10 mg daily. 4. Hypothyroidism: Continued on thyroxine replacement therapy.  5. Mild Dehydration:  Patient presented with mildly elevated BUN/Creatinine ratio. This has already improved, on iv fluids. IV fluids are  reduced today, to avoid fluid overload.  6. Anemia: This is mild, and probably due to acute blood loss. However, HB is reasonable at this time. Will follow CBC. 8. Dementia: Mild/Stable. Will continue pre-admission psychotropics.  Comment: Will defer pain management and DVT prophylaxis, to the orthopedic team.   LOS: 2 days   Obi Scrima,CHRISTOPHER 12/04/2011, 12:24 PM

## 2011-12-04 NOTE — Op Note (Signed)
  OPERATIVE REPORT  PREOPERATIVE DIAGNOSIS: Right intertrochanteric femur fracture.   POSTOP DIAGNOSIS: Right intertrochanteric femur fracture.   PROCEDURE: Intramedullary nailing, Right intertrochanteric femur  fracture.   SURGEON: Ollen Gross, M.D.   ASSISTANT: Alexzandrew L. Perkins, P.A.C.   ANESTHESIA:General  Estimated BLOOD LOSS: minimal  DRAINS: None.   COMPLICATIONS:   None  CONDITION: -PACU - hemodynamically stable.    CLINICAL NOTE: Jasmin Rodriguez is an 78 y.o. female, who had a fall 2  days ago sustaining a displaced  Right intertrochanteric femur fracture. She has been cleared medically and presents now for intramedullary nailing of the right femur    PROCEDURE IN DETAIL: After successful administration of  General,  the patient was placed on the fracture table with Right lower extremity in a well-padded traction boot,  Left lower extremity in a well-padded leg holder. Under fluoroscopic guidance, the fracture was reduced. The traction was locked in this position. Thigh was prepped and draped in the usual sterile fashion. The guide pin for the [employer] was then passed percutaneously to the tip of the greater trochanter, was entered into the femoral canal. It was passed into the canal. The small incision was made and the starter reamer passed over the guide pin. This was then removed. The nail which was a 9 mm diameter short trochanteric nail with 125 degrees angle was attached to the external guide and then passed into the femoral canal, impacted to the appropriate depth in the canal, then we used the external guide to place the lag screw. Through the external guide, a guide pin was passed. Small incision made, and the guide pin was in the center of the femoral head on the AP and slightly center to posterior on the lateral. Length was 85 mm. Triple reamer was passed over the guide pin. 85 mm lag screw was placed. It was then locked down with a locking screw. A  second derotational screw was placed through the external guide and is 65 mm in length.Through the external guide, the distal interlock was placed through the static hole and this was 35 mm in length with excellent bicortical purchase. The external guide was then removed. Hardware was in good position and fracture was well reduced. Wound was copiously irrigated with saline solution, and  closed deep with interrupted 1 Vicryl, subcu interrupted 2-0 Vicryl, subcuticular running 4-0 Monocryl. Incision was cleaned and dried and sterile dressings applied. She was awakened and transported to recovery in stable condition.   Gus Rankin [name], MD    12/04/2011, 6:27 PM

## 2011-12-04 NOTE — Anesthesia Preprocedure Evaluation (Addendum)
Anesthesia Evaluation  Patient identified by MRN, date of birth, ID band Patient awake    Reviewed: Allergy & Precautions, H&P , NPO status , Patient's Chart, lab work & pertinent test results  Airway Mallampati: II TM Distance: >3 FB Neck ROM: full    Dental No notable dental hx. (+) Teeth Intact and Dental Advisory Given   Pulmonary neg pulmonary ROS,  breath sounds clear to auscultation  Pulmonary exam normal       Cardiovascular Exercise Tolerance: Good hypertension, Pt. on medications negative cardio ROS  Rhythm:regular Rate:Normal     Neuro/Psych negative neurological ROS  negative psych ROS   GI/Hepatic negative GI ROS, Neg liver ROS,   Endo/Other  negative endocrine ROSHypothyroidism   Renal/GU negative Renal ROS  negative genitourinary   Musculoskeletal   Abdominal   Peds  Hematology negative hematology ROS (+) Blood dyscrasia, anemia , Hgb. 9.7   Anesthesia Other Findings   Reproductive/Obstetrics negative OB ROS                          Anesthesia Physical Anesthesia Plan  ASA: III  Anesthesia Plan: General   Post-op Pain Management:    Induction: Intravenous  Airway Management Planned: Oral ETT  Additional Equipment:   Intra-op Plan:   Post-operative Plan: Extubation in OR  Informed Consent: I have reviewed the patients History and Physical, chart, labs and discussed the procedure including the risks, benefits and alternatives for the proposed anesthesia with the patient or authorized representative who has indicated his/her understanding and acceptance.   Dental Advisory Given  Plan Discussed with: CRNA and Surgeon  Anesthesia Plan Comments:         Anesthesia Quick Evaluation

## 2011-12-05 LAB — BASIC METABOLIC PANEL
CO2: 24 mEq/L (ref 19–32)
Calcium: 8.2 mg/dL — ABNORMAL LOW (ref 8.4–10.5)
GFR calc non Af Amer: 43 mL/min — ABNORMAL LOW (ref >90)
Glucose, Bld: 127 mg/dL — ABNORMAL HIGH (ref 70–99)
Potassium: 3.8 mEq/L (ref 3.5–5.1)
Sodium: 139 mEq/L (ref 135–145)

## 2011-12-05 LAB — CBC
Hemoglobin: 9.2 g/dL — ABNORMAL LOW (ref 12.0–15.0)
MCH: 28.3 pg (ref 26.0–34.0)
MCHC: 32.2 g/dL (ref 30.0–36.0)
Platelets: 105 10*3/uL — ABNORMAL LOW (ref 150–400)
RBC: 3.25 MIL/uL — ABNORMAL LOW (ref 3.87–5.11)

## 2011-12-05 NOTE — Progress Notes (Signed)
12/05/11 1600  PT Visit Information  Last PT Received On 12/05/11  Assistance Needed +2  PT Time Calculation  PT Start Time 1432  PT Stop Time 1456  PT Time Calculation (min) 24 min  Subjective Data  Subjective I am ready to go to bed.  Precautions  Precautions Fall  Restrictions  RLE Weight Bearing WBAT  Cognition  Overall Cognitive Status Appears within functional limits for tasks assessed/performed  Arousal/Alertness Awake/alert  Orientation Level Appears intact for tasks assessed  Behavior During Session [facility] for tasks performed  Bed Mobility  Bed Mobility Sit to Supine  Sit to Supine 3: Mod assist  Details for Bed Mobility Assistance cues for technique  Transfers  Transfers Sit to Stand;Stand to Sit  Sit to Stand 3: Mod assist;With armrests;From chair/3-in-1  Stand to Sit 3: Mod assist;To bed  Details for Transfer Assistance cues for hand placement and safety  Ambulation/Gait  General Gait Details stand step pivot bed to chair with RW and mod assist, verbal cues for sequencing and RW  PT - End of Session  Activity Tolerance Patient tolerated treatment well  Patient left in bed;with call bell/phone within reach  PT - Assessment/Plan  Comments on Treatment Session pt doing well; O2 on at 4L; pt very pleasant and thankful for PT assisting her back to bed, stated she had called and no one responded  PT Plan Discharge plan remains appropriate;Frequency remains appropriate  PT Frequency Min 3X/week  Follow Up Recommendations Skilled nursing facility  Equipment Recommended Defer to next venue  Acute Rehab PT Goals  Time For Goal Achievement 12/19/11  Potential to Achieve Goals Good  Pt will go Sit to Stand with min assist  PT Goal: Sit to Stand - Progress Progressing toward goal  Pt will Ambulate with min assist;51 - 150 feet;with rolling walker  PT Goal: Ambulate - Progress Progressing toward goal  PT General Charges  $$ ACUTE PT VISIT 1 Procedure  PT Treatments    $Therapeutic Activity 23-37 mins
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
## 2011-12-05 NOTE — Progress Notes (Signed)
Post-op day 1.  She is alert and moves rt foot well.  Hgb--9.2, down from 9.7 yesterday and 10.6 6/6.
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
## 2011-12-06 DIAGNOSIS — E860 Dehydration: Secondary | ICD-10-CM

## 2011-12-06 DIAGNOSIS — I10 Essential (primary) hypertension: Secondary | ICD-10-CM

## 2011-12-06 DIAGNOSIS — S72009A Fracture of unspecified part of neck of unspecified femur, initial encounter for closed fracture: Secondary | ICD-10-CM

## 2011-12-06 LAB — CBC
MCH: 28.3 pg (ref 26.0–34.0)
Platelets: 136 10*3/uL — ABNORMAL LOW (ref 150–400)
RBC: 3.04 MIL/uL — ABNORMAL LOW (ref 3.87–5.11)
RDW: 14.6 % (ref 11.5–15.5)

## 2011-12-06 LAB — BASIC METABOLIC PANEL
CO2: 23 mEq/L (ref 19–32)
Calcium: 8.5 mg/dL (ref 8.4–10.5)
GFR calc Af Amer: 54 mL/min — ABNORMAL LOW (ref >90)
GFR calc non Af Amer: 46 mL/min — ABNORMAL LOW (ref >90)
Sodium: 138 mEq/L (ref 135–145)

## 2011-12-06 MED ORDER — SODIUM CHLORIDE 0.9 % IJ SOLN
10.0000 mL | Freq: Two times a day (BID) | INTRAMUSCULAR | Status: DC
  Administered 2011-12-06: 10 mL via INTRAVENOUS

## 2011-12-06 NOTE — Progress Notes (Signed)
OT Discharge Note: Note plan is for pt to d/c to SNF Mon or Tues. Will defer eval to SNF and sign off per departmental protocol. If d/c disposition changes, please re-consult OT. Thank you! Glendale Chard, OTR/L Pager: [phone] 12/06/2011

## 2011-12-06 NOTE — Progress Notes (Signed)
Subjective: 2 Days Post-Op Procedure(s) (LRB): INTRAMEDULLARY (IM) NAIL FEMORAL (Right) Patient reports pain as mild.   Patient seen in rounds with Dr. Darrelyn Hillock. Patient is well, and has had no acute complaints or problems. She denies chest pain and shortness of breath. She reports that the pain in her hip is improving. Plan is to go Skilled nursing facility after hospital stay.  Objective: Vital signs in last 24 hours: Temp:  [98 F (36.7 C)-98.4 F (36.9 C)] 98.2 F (36.8 C) (06/09 0600) Pulse Rate:  [54-60] 60  (06/09 0600) Resp:  [16-18] 18  (06/09 0600) BP: (133-161)/(40-70) 161/70 mmHg (06/09 0600) SpO2:  [78 %-99 %] 91 % (06/09 0600)  Intake/Output from previous day:  Intake/Output Summary (Last 24 hours) at 12/06/11 0920 Last data filed at 12/06/11 0600  Gross per 24 hour  Intake    900 ml  Output    975 ml  Net    -75 ml    Intake/Output this shift:    Labs:  Basename 12/06/11 0540 12/05/11 0525 12/04/11 0450  HGB 8.6* 9.2* 9.7*    Basename 12/06/11 0540 12/05/11 0525  WBC 11.5* 8.8  RBC 3.04* 3.25*  HCT 26.7* 28.6*  PLT 136* 105*    Basename 12/06/11 0540 12/05/11 0525  NA 138 139  K 4.5 3.8  CL 106 105  CO2 23 24  BUN 26* 21  CREATININE 1.00 1.06  GLUCOSE 120* 127*  CALCIUM 8.5 8.2*   EXAM General - Patient is Alert and Oriented Extremity - Neurologically intact Neurovascular intact Dorsiflexion/Plantar flexion intact Dressing/Incision - clean, dry, no drainage. Dressing changed Motor Function - intact, moving foot and toes well on exam.   Past Medical History  Diagnosis Date  . Hypertension   . Anemia   . Thyroid disease   . Hyperlipidemia   . Osteopenia   . Radial fracture   . Femur fracture     Assessment/Plan: 2 Days Post-Op Procedure(s) (LRB): INTRAMEDULLARY (IM) NAIL FEMORAL (Right) Principal Problem:  *Closed right hip fracture Active Problems:  HTN (hypertension)  Hypothyroidism  Dehydration   Advance diet Discharge  to SNF tentative Monday or Tuesday DVT Prophylaxis - Aspirin   Mckynlie Vanderslice LAUREN 12/06/2011, 9:20 AM
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
## 2011-12-07 DIAGNOSIS — E860 Dehydration: Secondary | ICD-10-CM

## 2011-12-07 DIAGNOSIS — I10 Essential (primary) hypertension: Secondary | ICD-10-CM

## 2011-12-07 DIAGNOSIS — S72009A Fracture of unspecified part of neck of unspecified femur, initial encounter for closed fracture: Secondary | ICD-10-CM

## 2011-12-07 LAB — CBC
Hemoglobin: 8.1 g/dL — ABNORMAL LOW (ref 12.0–15.0)
MCH: 28.5 pg (ref 26.0–34.0)
MCV: 87.3 fL (ref 78.0–100.0)
RBC: 2.84 MIL/uL — ABNORMAL LOW (ref 3.87–5.11)
WBC: 9.4 10*3/uL (ref 4.0–10.5)

## 2011-12-07 LAB — BASIC METABOLIC PANEL
CO2: 23 mEq/L (ref 19–32)
Calcium: 8.1 mg/dL — ABNORMAL LOW (ref 8.4–10.5)
Chloride: 107 mEq/L (ref 96–112)
Creatinine, Ser: 0.94 mg/dL (ref 0.50–1.10)
Glucose, Bld: 101 mg/dL — ABNORMAL HIGH (ref 70–99)

## 2011-12-07 MED ORDER — ENOXAPARIN SODIUM 40 MG/0.4ML ~~LOC~~ SOLN
40.0000 mg | SUBCUTANEOUS | Status: DC
Start: 2011-12-07 — End: 2012-10-30

## 2011-12-07 MED ORDER — ASPIRIN 81 MG PO CHEW: 81.0000 mg | CHEWABLE_TABLET | Freq: Every day | ORAL | Status: DC

## 2011-12-07 MED ORDER — OXYCODONE HCL 5 MG PO TABS: 5.0000 mg | ORAL_TABLET | ORAL | Status: AC | PRN

## 2011-12-07 MED ORDER — FERROUS SULFATE 325 (65 FE) MG PO TABS: 325.0000 mg | ORAL_TABLET | Freq: Two times a day (BID) | ORAL | Status: DC

## 2011-12-07 MED ORDER — FERROUS SULFATE 325 (65 FE) MG PO TABS
325.0000 mg | ORAL_TABLET | Freq: Two times a day (BID) | ORAL | Status: DC
  Administered 2011-12-07: 325 mg via ORAL
  Filled 2011-12-07 (×3): qty 1

## 2011-12-07 MED ORDER — AMLODIPINE BESYLATE 10 MG PO TABS: 10.0000 mg | ORAL_TABLET | Freq: Every day | ORAL | Status: DC

## 2011-12-07 NOTE — Discharge Summary (Signed)
Physician Discharge Summary  Patient ID: Jasmin Rodriguez MRN: [id number] DOB/AGE: 1914/01/06 78 y.o.  Admit date: 12/02/2011 Discharge date: 12/07/2011  Primary Care Physician:  No primary provider on file.   Discharge Diagnoses:    Patient Active Problem List  Diagnoses  . Closed right hip fracture  . HTN (hypertension)  . Hypothyroidism  . Dehydration    Medication List  As of 12/07/2011 12:27 PM   TAKE these medications         amLODipine 10 MG tablet   Commonly known as: NORVASC   Take 1 tablet (10 mg total) by mouth daily.      aspirin 81 MG chewable tablet   Chew 1 tablet (81 mg total) by mouth daily.      calcium-vitamin D 500-200 MG-UNIT per tablet   Commonly known as: OSCAL WITH D   Take 1 tablet by mouth 2 (two) times daily.      cholecalciferol 1000 UNITS tablet   Commonly known as: VITAMIN D   Take 2,000 Units by mouth daily.      citalopram 40 MG tablet   Commonly known as: CELEXA   Take 40 mg by mouth daily.      donepezil 10 MG tablet   Commonly known as: ARICEPT   Take 10 mg by mouth at bedtime.      enoxaparin 40 MG/0.4ML injection   Commonly known as: LOVENOX   Inject 0.4 mLs (40 mg total) into the skin daily.      ferrous sulfate 325 (65 FE) MG tablet   Take 1 tablet (325 mg total) by mouth 2 (two) times daily with a meal.      levothyroxine 75 MCG tablet   Commonly known as: SYNTHROID, LEVOTHROID   Take 75 mcg by mouth daily.      multivitamin with minerals Tabs   Take 1 tablet by mouth daily.      oxyCODONE 5 MG immediate release tablet   Commonly known as: Oxy IR/ROXICODONE   Take 1-2 tablets (5-10 mg total) by mouth every 4 (four) hours as needed ((for MODERATE breakthrough pain)).             Disposition and Follow-up:  Follow up with Primary MD and with SNF MD.  Consults:  orthopedic surgery   Dr Ollen Gross, orthopedic surgeon.  Significant Diagnostic Studies:  Dg Chest 1 View  12/02/2011  *RADIOLOGY REPORT*   Clinical Data: Fall, hip pain  CHEST - 1 VIEW  Comparison: 10/21/2009  Findings: Cardiomegaly again noted.  No acute infiltrate or pleural effusion.  No pulmonary edema.  Stable calcified granuloma right lower lobe.  IMPRESSION: No active disease.  No significant change.  Original Report Authenticated By: Natasha Mead, M.D.   Dg Hip Complete Right  12/02/2011  *RADIOLOGY REPORT*  Clinical Data: Fall, right hip pain  RIGHT HIP - COMPLETE 2+ VIEW  Comparison: 10/21/2009.  Findings: Three views of the right hip submitted.  There is displaced intertrochanteric fracture of the right proximal femur. Metallic fixation pin and metallic rod noted in proximal left femur.  Osteopenia is noted.  Degenerative changes lumbar spine. A subtrochanteric vertical fracture line is extending in the proximal femoral shaft right femur.  IMPRESSION: Displaced intertrochanteric fracture of the right proximal femur. There is diffuse osteopenia.  Per CMS PQRS reporting requirements (PQRS Measure 24): Given the patient's age of greater than 50 and the fracture site (hip, distal radius, or spine), the patient should be tested for osteoporosis using DXA,  and the appropriate treatment considered based on the DXA results.  Original Report Authenticated By: Natasha Mead, M.D.   Ct Head Wo Contrast  12/02/2011  *RADIOLOGY REPORT*  Clinical Data: Larey Seat.  Hit head.  CT HEAD WITHOUT CONTRAST  Technique:  Contiguous axial images were obtained from the base of the skull through the vertex without contrast.  Comparison: None  Findings: There is a right occipital scalp hematoma without underlying skull fracture or radiopaque foreign body.  There is age related cerebral atrophy, ventriculomegaly and periventricular white matter disease.  No extra-axial fluid collections.  No CT findings for hemispheric infarction and/or intracranial hemorrhage.  The brainstem and cerebellum grossly normal.  The bony structures are intact.  The paranasal sinuses mastoid air  cells are clear.  IMPRESSION:  1.  Age related cerebral atrophy, ventriculomegaly and periventricular white matter disease. No acute intracranial findings. 2.  Right occipital scalp hematoma without underlying skull fracture.  Original Report Authenticated By: P. Loralie Champagne, M.D.    Brief H and P: For complete details, refer to admission H and P. However, in brief, this is a 78 year old female, with history of Hypertension; Anemia; Hypothyroidism; Hyperlipidemia; Osteopenia; remote Radial and Femal fractures, presenting following a fall, caused by tripping on something. Was unable to weight bear, and imaging studies in the ED, confirmed aright hip intertrochanteric fracture. She was admitted for further evaluation, investigation and management.   Physical Exam: On 12/07/11. General: Comfortable, alert, communicative, not short of breath at rest.  HEENT: Mild clinical pallor, no jaundice, no conjunctival injection or discharge. Hydration status is fair.  NECK: Supple, JVP not seen, no carotid bruits, no palpable lymphadenopathy, no palpable goiter.  CHEST: Clinically clear to auscultation, no wheezes, no crackles.  HEART: Sounds 1 and 2 heard, normal, regular, no murmurs.  ABDOMEN: Full, soft, non-tender, no palpable organomegaly, no palpable masses, normal bowel sounds.  GENITALIA: Not examined.  LOWER EXTREMITIES: No pitting edema, palpable peripheral pulses. MUSCULOSKELETAL SYSTEM: Generalized osteoarthritic changes. Dressings right hip appear clean and dry.  CENTRAL NERVOUS SYSTEM: No focal neurologic deficit on gross examination.   Hospital Course:  Active Problems 1. Closed right hip fracture: This appears due to a mechanical fall. Given patient's age she was considered at moderate risk for surgery but did not require any further cardiac workup. She has no prior cardiac history, and no symptoms of exertional dyspnea or angina. Orthopedic consultation was provided by Dr. Loel Ro,  and patient underwent ORIF on 12/04/11. Post-operatively, she did well, and remained stable, without complications, and with only minimal pain.  2. Head trauma: Patient had a small occipital scalp hematoma, caused by fall. Fortunately, there was no evidence of skull fracture or intracranial injury. This was not problematic. 3. HTN (hypertension): Norvasc was increased to 10 mg daily, with satisfactory BP control. 4. Hypothyroidism: Continued on thyroxine replacement therapy.  5. Mild Dehydration: Patient presented with mildly elevated BUN/Creatinine ratio. This resolved, on iv fluids. Patient has good oral intake, and to avoid fluid overload, we discontinued iv fluids on 12/06/11, without any deleterious effects.  6. Anemia: Patient has a history of chronic anemia, and hemoglobin dropped in the first few days of hospitalization, probably due to acute blood loss. However, HB has remained reasonable. Iron supplements have been commenced. .  8. Dementia: Mild/Stable. Continued on pre-admission psychotropics.   Comment: Stable for discharge to SNF on 12/07/11.   Note: Per orthopedic recommendations, patient is to continue Lovenox 40 mg SQ daily for 7 more  days then do 81 mg Aspirin daily for 4 additional weeks, for DVT prophylaxis.   Time spent on Discharge: 45 mins.  Signed: Hodari Chuba,CHRISTOPHER 12/07/2011, 12:27 PM

## 2011-12-07 NOTE — Progress Notes (Signed)
Patient cleared for discharge. Packet copied and placed in [place]. ptar called for transportation to [facility]. Patient aware and agreeable.  Aldred Mase C. Ameilia Rattan MSW, LCSW [phone]

## 2011-12-07 NOTE — Progress Notes (Signed)
Subjective: 3 Days Post-Op Procedure(s) (LRB): INTRAMEDULLARY (IM) NAIL FEMORAL (Right) Patient reports pain as mild.   Plan is to go Skilled nursing facility after hospital stay. No reports of dizziness or lightheadedness  Objective: Vital signs in last 24 hours: Temp:  [98.9 F (37.2 C)-99 F (37.2 C)] 98.9 F (37.2 C) (06/10 0435) Pulse Rate:  [60-62] 60  (06/10 0435) Resp:  [16-20] 16  (06/10 0435) BP: (135-154)/(64-71) 137/71 mmHg (06/10 0435) SpO2:  [92 %-94 %] 93 % (06/10 0435)  Intake/Output from previous day:  Intake/Output Summary (Last 24 hours) at 12/07/11 0745 Last data filed at 12/06/11 1631  Gross per 24 hour  Intake 3081.87 ml  Output      0 ml  Net 3081.87 ml    Intake/Output this shift:    Labs:  Basename 12/07/11 0417 12/06/11 0540 12/05/11 0525  HGB 8.1* 8.6* 9.2*    Basename 12/07/11 0417 12/06/11 0540  WBC 9.4 11.5*  RBC 2.84* 3.04*  HCT 24.8* 26.7*  PLT 131* 136*    Basename 12/07/11 0417 12/06/11 0540  NA 138 138  K 4.1 4.5  CL 107 106  CO2 23 23  BUN 25* 26*  CREATININE 0.94 1.00  GLUCOSE 101* 120*  CALCIUM 8.1* 8.5   No results found for this basename: LABPT:2,INR:2 in the last 72 hours  EXAM General - Patient is Alert, Appropriate and Oriented Extremity - Neurologically intact Dorsiflexion/Plantar flexion intact Incision: no drainage No cellulitis present Dressing/Incision - clean, dry, no drainage Motor Function - intact, moving foot and toes well on exam.   Past Medical History  Diagnosis Date  . Hypertension   . Anemia   . Thyroid disease   . Hyperlipidemia   . Osteopenia   . Radial fracture   . Femur fracture     Assessment/Plan: 3 Days Post-Op Procedure(s) (LRB): INTRAMEDULLARY (IM) NAIL FEMORAL (Right) Principal Problem:  *Closed right hip fracture Active Problems:  HTN (hypertension)  Hypothyroidism  Dehydration   Up with therapy Discharge to SNF Asymptomatic from blood loss anemia. Would not  transfuse. Stable from our standpoint to go to SNF today Continue Lovenox 40 mg SQ daily for 7 more days then do 81 mg Aspirin daily for 4 additional weeks  DVT Prophylaxis - Lovenox WBAT RLE Daily Physical Therapy May shower and get incisions wet F/U in office in 2 weeks . Call [phone] to schedule appointment  [name] 12/07/2011, 7:45 AM

## 2011-12-07 NOTE — Progress Notes (Signed)
Pt being d/c to snf.  Pt alert and stable.  Iv site d/c this am.  Called report to [facility].  Ambulance transport here at this time.

## 2011-12-08 NOTE — OR Nursing (Signed)
Chart amended 12-08-11 @ 1039. Change to chart was correction of Procedure Start Time.

## 2012-04-22 LAB — CBC AND DIFFERENTIAL
HCT: 37 % (ref 36–46)
Hemoglobin: 12.2 g/dL (ref 12.0–16.0)
WBC: 7.8 10^3/mL

## 2012-04-22 LAB — BASIC METABOLIC PANEL
BUN: 21 mg/dL (ref 4–21)
Glucose: 105 mg/dL
Potassium: 4.1 mmol/L (ref 3.4–5.3)
Sodium: 141 mmol/L (ref 137–147)

## 2012-04-22 LAB — HEPATIC FUNCTION PANEL
AST: 31 U/L (ref 13–35)
Bilirubin, Total: 0.7 mg/dL

## 2012-10-12 ENCOUNTER — Non-Acute Institutional Stay: Payer: Medicare Other | Admitting: Nurse Practitioner

## 2012-10-12 DIAGNOSIS — N390 Urinary tract infection, site not specified: Secondary | ICD-10-CM | POA: Insufficient documentation

## 2012-10-12 DIAGNOSIS — F0390 Unspecified dementia without behavioral disturbance: Secondary | ICD-10-CM | POA: Insufficient documentation

## 2012-10-12 DIAGNOSIS — I10 Essential (primary) hypertension: Secondary | ICD-10-CM

## 2012-10-12 DIAGNOSIS — E039 Hypothyroidism, unspecified: Secondary | ICD-10-CM

## 2012-10-12 DIAGNOSIS — F329 Major depressive disorder, single episode, unspecified: Secondary | ICD-10-CM

## 2012-10-12 HISTORY — DX: Unspecified dementia, unspecified severity, without behavioral disturbance, psychotic disturbance, mood disturbance, and anxiety: F03.90

## 2012-10-12 NOTE — Progress Notes (Signed)
Patient ID: Jasmin Rodriguez, female   DOB: April 01, 1914, 79 y.o.   MRN: [id number]  Chief Complaint:  Chief Complaint  Patient presents with  . Medical Managment of Chronic Issues    uti     HPI:    Problem List Items Addressed This Visit     ICD-9-CM   HTN (hypertension) - Primary     Controlled on Amlodipine. Update CMP    Hypothyroidism     Stable. Update TSH. Continue Levothyroxine daily    Urinary tract infection, site not specified     10/04/12 UA showed C. Freundii, Septra DS bid for 7 days initiated and completed. The patient is asymptomatic presently.     Dementia     Stable on Aricept, currently the patient is functioning well at AL level.     Depression     Stable. Continue Celexa 40mg  and Remeron 7.5mg  at night.        Review of Systems:  Review of Systems  Constitutional: Negative for fever, chills, malaise/fatigue and diaphoresis.  HENT: Positive for hearing loss. Negative for ear pain, congestion, sore throat and neck pain.   Eyes: Negative for pain, discharge and redness.  Respiratory: Negative for cough, sputum production and wheezing.   Cardiovascular: Negative for chest pain, palpitations, orthopnea, claudication, leg swelling and PND.  Gastrointestinal: Negative for heartburn, nausea, vomiting, abdominal pain, diarrhea, constipation and blood in stool.  Genitourinary: Positive for frequency. Negative for dysuria, urgency, hematuria and flank pain.  Musculoskeletal: Positive for back pain and joint pain. Negative for myalgias and falls.  Skin: Negative for itching and rash.  Neurological: Negative for dizziness, tingling, tremors, sensory change, speech change, focal weakness, seizures, loss of consciousness, weakness and headaches.  Endo/Heme/Allergies: Negative for environmental allergies and polydipsia. Does not bruise/bleed easily.  Psychiatric/Behavioral: Positive for depression (flat affect) and memory loss. Negative for hallucinations. The  patient is not nervous/anxious and does not have insomnia.      Medications: Reviewed at [facility]   Physical Exam: Physical Exam  Constitutional: She is oriented to person, place, and time. She appears well-developed and well-nourished. No distress.  HENT:  Head: Normocephalic and atraumatic.  Mouth/Throat: No oropharyngeal exudate.  Eyes: Conjunctivae and EOM are normal. Pupils are equal, round, and reactive to light. No scleral icterus.  Neck: Normal range of motion. Neck supple. No JVD present. No thyromegaly present.  Cardiovascular: Normal rate and regular rhythm.   No murmur heard. Pulmonary/Chest: Effort normal and breath sounds normal. No respiratory distress. She has no wheezes. She has no rales.  Abdominal: Soft. Bowel sounds are normal. She exhibits no distension. There is no tenderness.  Musculoskeletal: Normal range of motion. She exhibits no edema and no tenderness.  Lymphadenopathy:    She has no cervical adenopathy.  Neurological: She is alert and oriented to person, place, and time. She has normal reflexes. She displays normal reflexes. No cranial nerve deficit. She exhibits normal muscle tone. Coordination normal.  Skin: Skin is warm and dry. No rash noted. She is not diaphoretic. No erythema.  Psychiatric: Her mood appears not anxious. Her affect is not angry, not blunt, not labile and not inappropriate. Her speech is not rapid and/or pressured, not delayed, not tangential and not slurred. She is not agitated, not aggressive, not hyperactive, not slowed, not withdrawn, not actively hallucinating and not combative. Thought content is not paranoid and not delusional. Cognition and memory are impaired. She does not express impulsivity or inappropriate judgment. She exhibits a depressed  mood. She is communicative. She exhibits abnormal recent memory. She exhibits normal remote memory.     There were no vitals filed for this visit.    Labs reviewed: Basic Metabolic  Panel:  Recent Labs  12/05/11 0525 12/06/11 0540 12/07/11 0417 03/22/12 04/22/12  NA 139 138 138  --  141  K 3.8 4.5 4.1  --  4.1  CL 105 106 107  --   --   CO2 24 23 23   --   --   GLUCOSE 127* 120* 101*  --   --   BUN 21 26* 25*  --  21  CREATININE 1.06 1.00 0.94  --  1.1  CALCIUM 8.2* 8.5 8.1*  --   --   TSH  --   --   --  3.24  --     Liver Function Tests:  Recent Labs  04/22/12  AST 31  ALT 14  ALKPHOS 73    CBC:  Recent Labs  12/02/11 1855  12/05/11 0525 12/06/11 0540 12/07/11 0417 04/22/12  WBC 9.7  < > 8.8 11.5* 9.4 7.8  NEUTROABS 6.4  --   --   --   --   --   HGB 12.0  < > 9.2* 8.6* 8.1* 12.2  HCT 36.5  < > 28.6* 26.7* 24.8* 37  MCV 86.3  < > 88.0 87.8 87.3  --   PLT 168  < > 105* 136* 131* 229  < > = values in this interval not displayed.  Anemia Panel: No results found for this basename: IRON, FOLATE, VITAMINB12,  in the last 8760 hours  Significant Diagnostic Results:  11/2011 CT head  IMPRESSION:   1.  Age related cerebral atrophy, ventriculomegaly and periventricular white matter disease. No acute intracranial findings. 2.  Right occipital scalp hematoma without underlying skull fracture.    Assessment/Plan HTN (hypertension) Controlled on Amlodipine. Update CMP  Urinary tract infection, site not specified 10/04/12 UA showed C. Freundii, Septra DS bid for 7 days initiated and completed. The patient is asymptomatic presently.   Dementia Stable on Aricept, currently the patient is functioning well at AL level.   Hypothyroidism Stable. Update TSH. Continue Levothyroxine daily  Depression Stable. Continue Celexa 40mg  and Remeron 7.5mg  at night.       Family/ staff Communication: none   Goals of care: AL for care.    Labs/tests ordered CBC, CMP, TSH

## 2012-10-12 NOTE — Assessment & Plan Note (Signed)
Stable. Continue Celexa 40mg and Remeron 7.5mg at night.    

## 2012-10-12 NOTE — Assessment & Plan Note (Addendum)
Controlled on Amlodipine. Update CMP   

## 2012-10-12 NOTE — Assessment & Plan Note (Addendum)
Stable. Update TSH. Continue Levothyroxine daily

## 2012-10-12 NOTE — Assessment & Plan Note (Signed)
Stable on Aricept, currently the patient is functioning well at AL level.

## 2012-10-12 NOTE — Assessment & Plan Note (Signed)
10/04/12 UA showed C. Freundii, Septra DS bid for 7 days initiated and completed. The patient is asymptomatic presently.

## 2012-10-27 LAB — CBC AND DIFFERENTIAL
HCT: 35 % — AB (ref 36–46)
Hemoglobin: 11.8 g/dL — AB (ref 12.0–16.0)
Platelets: 208 10*3/uL (ref 150–399)
WBC: 6.7 10^3/mL

## 2012-10-27 LAB — BASIC METABOLIC PANEL
BUN: 24 mg/dL — AB (ref 4–21)
Glucose: 79 mg/dL
Sodium: 141 mmol/L (ref 137–147)

## 2012-10-27 LAB — HEPATIC FUNCTION PANEL
AST: 25 U/L (ref 13–35)
Bilirubin, Total: 0.4 mg/dL

## 2012-10-27 LAB — TSH: TSH: 4.51 u[IU]/mL (ref 0.41–5.90)

## 2012-10-30 ENCOUNTER — Emergency Department (HOSPITAL_COMMUNITY)
Admission: EM | Admit: 2012-10-30 | Discharge: 2012-10-30 | Disposition: A | Discharge status: Home / Self Care | Payer: Medicare Other | Attending: Emergency Medicine | Admitting: Emergency Medicine

## 2012-10-30 ENCOUNTER — Encounter (HOSPITAL_COMMUNITY): Payer: Self-pay | Admitting: *Deleted

## 2012-10-30 ENCOUNTER — Emergency Department (HOSPITAL_COMMUNITY): Payer: Medicare Other

## 2012-10-30 DIAGNOSIS — S01511A Laceration without foreign body of lip, initial encounter: Secondary | ICD-10-CM

## 2012-10-30 DIAGNOSIS — Z79899 Other long term (current) drug therapy: Secondary | ICD-10-CM | POA: Insufficient documentation

## 2012-10-30 DIAGNOSIS — W010XXA Fall on same level from slipping, tripping and stumbling without subsequent striking against object, initial encounter: Secondary | ICD-10-CM | POA: Insufficient documentation

## 2012-10-30 DIAGNOSIS — S025XXA Fracture of tooth (traumatic), initial encounter for closed fracture: Secondary | ICD-10-CM | POA: Insufficient documentation

## 2012-10-30 DIAGNOSIS — Z7982 Long term (current) use of aspirin: Secondary | ICD-10-CM | POA: Insufficient documentation

## 2012-10-30 DIAGNOSIS — E785 Hyperlipidemia, unspecified: Secondary | ICD-10-CM | POA: Insufficient documentation

## 2012-10-30 DIAGNOSIS — Y92009 Unspecified place in unspecified non-institutional (private) residence as the place of occurrence of the external cause: Secondary | ICD-10-CM | POA: Insufficient documentation

## 2012-10-30 DIAGNOSIS — Z8781 Personal history of (healed) traumatic fracture: Secondary | ICD-10-CM | POA: Insufficient documentation

## 2012-10-30 DIAGNOSIS — D649 Anemia, unspecified: Secondary | ICD-10-CM | POA: Insufficient documentation

## 2012-10-30 DIAGNOSIS — E039 Hypothyroidism, unspecified: Secondary | ICD-10-CM | POA: Insufficient documentation

## 2012-10-30 DIAGNOSIS — M899 Disorder of bone, unspecified: Secondary | ICD-10-CM | POA: Insufficient documentation

## 2012-10-30 DIAGNOSIS — S01501A Unspecified open wound of lip, initial encounter: Secondary | ICD-10-CM | POA: Insufficient documentation

## 2012-10-30 DIAGNOSIS — I10 Essential (primary) hypertension: Secondary | ICD-10-CM | POA: Insufficient documentation

## 2012-10-30 DIAGNOSIS — Y9389 Activity, other specified: Secondary | ICD-10-CM | POA: Insufficient documentation

## 2012-10-30 DIAGNOSIS — S032XXA Dislocation of tooth, initial encounter: Secondary | ICD-10-CM

## 2012-10-30 LAB — CBC
Hemoglobin: 13 g/dL (ref 12.0–15.0)
MCH: 28.9 pg (ref 26.0–34.0)
Platelets: 256 10*3/uL (ref 150–400)
RBC: 4.5 MIL/uL (ref 3.87–5.11)
WBC: 10.1 10*3/uL (ref 4.0–10.5)

## 2012-10-30 LAB — POCT I-STAT, CHEM 8
BUN: 23 mg/dL (ref 6–23)
Chloride: 106 mEq/L (ref 96–112)
Creatinine, Ser: 1.4 mg/dL — ABNORMAL HIGH (ref 0.50–1.10)
Sodium: 140 mEq/L (ref 135–145)

## 2012-10-30 MED ORDER — LIDOCAINE-EPINEPHRINE (PF) 1 %-1:200000 IJ SOLN
30.0000 mL | Freq: Once | INTRAMUSCULAR | Status: AC
  Administered 2012-10-30: 10 mL

## 2012-10-30 NOTE — ED Notes (Addendum)
Per ems pt is from [facility]. Pt was walking in short high heels and tripped over threshold and fell forward. Landed on her face and left side. Left rib pain. Pt denies LOC. Pts front tooth became dislodged and is on ice in a cup at bedside.   Pt has some drainage in back of throat, ems and pt tried yonker suctioning, pt made herself gag. Laceration to upper lip.

## 2012-10-30 NOTE — ED Notes (Signed)
md at bedside  Pt alert and oriented x4. Respirations even and unlabored, bilateral symmetrical rise and fall of chest. Skin warm and dry. In no acute distress. Denies needs.   

## 2012-10-30 NOTE — ED Provider Notes (Signed)
History     CSN: [id number]  Arrival date & time 10/30/12  1751   First MD Initiated Contact with Patient 10/30/12 1759      Chief Complaint  Patient presents with  . Fall  . loss of tooth     (HPI Jasmin Rodriguez is a pleasant 79 year old woman with PMH of dementia, HTN, and hypothyroidism, resident of [facility] who presents [facility] the [facility] via EMS for fall. The patient was in her usual state of health when she tripped over a threshold and fell forward landing on her her face and left side. She has inner lip laceration with dislodgement of her front tooth. She complains of left side pain and lip pain. She denies nose pain, headache, chest pain, or shortness of breath.   Past Medical History  Diagnosis Date  . Hypertension   . Anemia   . Thyroid disease   . Hyperlipidemia   . Osteopenia   . Radial fracture   . Femur fracture     Past Surgical History  Procedure Laterality Date  . Hip fracture surgery      History reviewed. No pertinent family history.  History  Substance Use Topics  . Smoking status: Never Smoker   . Smokeless tobacco: Not on file  . Alcohol Use: No    OB History   Grav Para Term Preterm Abortions TAB SAB Ect Mult Living                  Review of Systems  Constitutional: Negative for fever, chills, diaphoresis, activity change, appetite change, fatigue and unexpected weight change.  HENT: Positive for dental problem.        Missing front tooth  Respiratory: Negative for cough and shortness of breath.   Cardiovascular: Negative for chest pain and palpitations.  Genitourinary: Negative for dysuria.  Musculoskeletal: Negative for back pain and gait problem.       Left side pain  Neurological: Negative for dizziness and headaches.    Allergies  Tramadol and Zoloft  Home Medications   Current Outpatient Rx  Name  Route  Sig  Dispense  Refill  . amLODipine (NORVASC) 10 MG tablet   Oral   Take 1 tablet (10 mg total) by mouth daily.    30 tablet   0   . aspirin 81 MG chewable tablet   Oral   Chew 1 tablet (81 mg total) by mouth daily.   100 tablet   0     To start on 12/15/11   . calcium-vitamin D (OSCAL WITH D) 500-200 MG-UNIT per tablet   Oral   Take 1 tablet by mouth 2 (two) times daily.         . cholecalciferol (VITAMIN D) 1000 UNITS tablet   Oral   Take 2,000 Units by mouth daily.         . citalopram (CELEXA) 40 MG tablet   Oral   Take 40 mg by mouth daily.         Marland Kitchen donepezil (ARICEPT) 10 MG tablet   Oral   Take 10 mg by mouth at bedtime.         . enoxaparin (LOVENOX) 40 MG/0.4ML injection   Subcutaneous   Inject 0.4 mLs (40 mg total) into the skin daily.   2.8 mL   0   . ferrous sulfate 325 (65 FE) MG tablet   Oral   Take 1 tablet (325 mg total) by mouth 2 (two) times  daily with a meal.   60 tablet   0   . levothyroxine (SYNTHROID, LEVOTHROID) 75 MCG tablet   Oral   Take 75 mcg by mouth daily.         . Multiple Vitamin (MULITIVITAMIN WITH MINERALS) TABS   Oral   Take 1 tablet by mouth daily.           BP 193/54  Pulse 69  Temp(Src) 98.6 F (37 C) (Oral)  Resp 16  SpO2 95%  Physical Exam  Nursing note and vitals reviewed. Constitutional: She is oriented to person, place, and time. She appears well-developed and well-nourished. No distress.  HENT:  Facial trauma with mild lip swelling, upper inner lip ~1cm  Laceration with minimum bleed.  Missing upper frontal labial incisor  Eyes: Conjunctivae are normal.  Cardiovascular: Normal rate and regular rhythm.   Pulmonary/Chest: Effort normal and breath sounds normal. No respiratory distress. She has no wheezes. She has no rales. She exhibits no tenderness.  Abdominal: Soft. Bowel sounds are normal. She exhibits no distension.  Musculoskeletal: She exhibits tenderness. She exhibits no edema.  Left side tenderness at the level of the costal margin  Neurological: She is alert and oriented to person, place, and time.   Skin: She is not diaphoretic.  Psychiatric: She has a normal mood and affect.    ED Course  Procedures (including critical care time)  LACERATION REPAIR Performed by: Sara Chu D Authorized by: Nelva Nay Consent: Verbal consent obtained. Risks and benefits: risks, benefits and alternatives were discussed Consent given by: patient Patient identity confirmed: provided demographic data Prepped and Draped in normal sterile fashion Wound explored  Laceration Location: Upper inner lip  Laceration Length: 2cm   No Foreign Bodies seen or palpated  Anesthesia: local infiltration  Local anesthetic: lidocaine 1%  epinephrine  Anesthetic total: 2ml  Irrigation method: syringe Amount of cleaning: standard  Skin closure: 6.0 vicryl  Number of sutures: 5  Technique: Individual interrupted  Patient tolerance: Patient tolerated the procedure well with no immediate complications.  Labs Reviewed  CBC   No results found.   No diagnosis found.    MDM  79 year old with PMH of dementia, HTN, nursing home resident presenting with mechanical fall resulting in lip laceration and tooth dislodgement.   CXR with no rib fracture. Patient reports that her left side pain is improving.   Discharge patient to SNF with Tylenol PRN for lip pain and left side pain. Patient advised on red flags for infection of her lip. Patient to follow up with her dentist for evaluation and possible prosthetic placement of her missing tooth. Her family was at her bedside and agree.     Ky Barban, MD 10/30/12 2101

## 2012-10-30 NOTE — ED Notes (Signed)
Pt back from x-ray.

## 2012-11-02 ENCOUNTER — Non-Acute Institutional Stay: Payer: Medicare Other | Admitting: Nurse Practitioner

## 2012-11-02 DIAGNOSIS — E039 Hypothyroidism, unspecified: Secondary | ICD-10-CM

## 2012-11-02 DIAGNOSIS — S20219A Contusion of unspecified front wall of thorax, initial encounter: Secondary | ICD-10-CM | POA: Insufficient documentation

## 2012-11-02 DIAGNOSIS — F329 Major depressive disorder, single episode, unspecified: Secondary | ICD-10-CM

## 2012-11-02 DIAGNOSIS — I10 Essential (primary) hypertension: Secondary | ICD-10-CM

## 2012-11-02 DIAGNOSIS — Z5189 Encounter for other specified aftercare: Secondary | ICD-10-CM

## 2012-11-02 DIAGNOSIS — S20212D Contusion of left front wall of thorax, subsequent encounter: Secondary | ICD-10-CM

## 2012-11-02 DIAGNOSIS — F0390 Unspecified dementia without behavioral disturbance: Secondary | ICD-10-CM

## 2012-11-02 NOTE — ED Provider Notes (Addendum)
I saw and evaluated the patient, reviewed the resident's note and I agree with the findings and plan.   .Face to face Exam:  General:  Awake HEENT: laceration to upper lip.  Avulsed tooth. Resp:  Normal effort Abd:  Nondistended Neuro:No focal weakness Lymph: No adenopathy I supervised and oversaw resident laceration repair.   Nelia Shi, MD 11/02/12 1610  Nelia Shi, MD 12/09/12 2156

## 2012-11-02 NOTE — Assessment & Plan Note (Signed)
Stable. Update TSH. Continue Levothyroxine 75mcg daily 

## 2012-11-02 NOTE — Progress Notes (Signed)
Patient ID: Jasmin Rodriguez, female   DOB: Jul 10, 1913, 79 y.o.   MRN: [id number]  Chief Complaint:  Chief Complaint  Patient presents with  . Medical Managment of Chronic Issues    s/p fall, bruised breasts and pain     HPI:   [place] Seat and landed on her chest--noted bruised breasts L>R--pain with movement and deep breath, X-ray r/o rib fx CXR was negative pneumothorax.  Problem List Items Addressed This Visit     ICD-9-CM   HTN (hypertension)     Controlled on Amlodipine. Update CMP      Hypothyroidism     Stable. Update TSH. Continue Levothyroxine daily      Dementia     Stable on Aricept, currently the patient is functioning well at AL level.       Depression     Stable. Continue Celexa 40mg  and Remeron 7.5mg  at night.       Contusion of chest - Primary (Chronic)     S/p fall and ED evaluation, massive bruise seen in her breasts L>R and tender, repeated CXR 11/01/12 no acute fracture or lytic destructive lesion, pain mainly in her breasts--Norco tid and prn tolerated and effective. CBC and CMP        Review of Systems:  Review of Systems  Constitutional: Negative for fever, chills, malaise/fatigue and diaphoresis.  HENT: Positive for hearing loss. Negative for ear pain, congestion, sore throat and neck pain.   Eyes: Negative for pain, discharge and redness.  Respiratory: Positive for shortness of breath (due to painful breathing, O2 2lpm via [place] helped. ). Negative for cough, sputum production and wheezing.   Cardiovascular: Positive for PND. Negative for chest pain, palpitations, orthopnea, claudication and leg swelling.  Gastrointestinal: Negative for heartburn, nausea, vomiting, abdominal pain, diarrhea, constipation and blood in stool.  Genitourinary: Positive for frequency. Negative for dysuria, urgency, hematuria and flank pain.  Musculoskeletal: Positive for back pain, joint pain and falls. Negative for myalgias.  Skin: Negative for itching and rash.        Bruised breasts  Neurological: Negative for dizziness, tingling, tremors, sensory change, speech change, focal weakness, seizures, loss of consciousness, weakness and headaches.  Endo/Heme/Allergies: Negative for environmental allergies and polydipsia. Does not bruise/bleed easily.  Psychiatric/Behavioral: Positive for depression (flat affect) and memory loss. Negative for hallucinations. The patient is not nervous/anxious and does not have insomnia.      Medications: Patient's Medications  New Prescriptions   No medications on file  Previous Medications   AMLODIPINE (NORVASC) 10 MG TABLET    Take 10 mg by mouth every morning.   CALCIUM-VITAMIN D (OSCAL WITH D) 500-200 MG-UNIT PER TABLET    Take 1 tablet by mouth 2 (two) times daily.   CHOLECALCIFEROL (VITAMIN D) 1000 UNITS TABLET    Take 2,000 Units by mouth every morning.    CITALOPRAM (CELEXA) 40 MG TABLET    Take 40 mg by mouth every morning.    DONEPEZIL (ARICEPT) 10 MG TABLET    Take 10 mg by mouth at bedtime.   LEVOTHYROXINE (SYNTHROID, LEVOTHROID) 75 MCG TABLET    Take 75 mcg by mouth every morning.    MIRTAZAPINE (REMERON) 15 MG TABLET    Take 7.5 mg by mouth at bedtime.   MULTIPLE VITAMIN (MULITIVITAMIN WITH MINERALS) TABS    Take 1 tablet by mouth daily at 12 noon.    POLYETHYLENE GLYCOL (MIRALAX / GLYCOLAX) PACKET    Take 17 g by mouth every morning.  Modified  Medications   No medications on file  Discontinued Medications   No medications on file     Physical Exam: Physical Exam  Constitutional: She is oriented to person, place, and time. She appears well-developed and well-nourished. No distress.  HENT:  Head: Normocephalic and atraumatic.  Mouth/Throat: No oropharyngeal exudate.  Eyes: Conjunctivae and EOM are normal. Pupils are equal, round, and reactive to light. No scleral icterus.  Neck: Normal range of motion. Neck supple. No JVD present. No thyromegaly present.  Cardiovascular: Normal rate and regular rhythm.    No murmur heard. Pulmonary/Chest: Effort normal and breath sounds normal. No respiratory distress. She has no wheezes. She has no rales.  Abdominal: Soft. Bowel sounds are normal. She exhibits no distension. There is no tenderness.  Musculoskeletal: Normal range of motion. She exhibits no edema and no tenderness.  Lymphadenopathy:    She has no cervical adenopathy.  Neurological: She is alert and oriented to person, place, and time. She has normal reflexes. She displays normal reflexes. No cranial nerve deficit. She exhibits normal muscle tone. Coordination normal.  Skin: Skin is warm and dry. No rash noted. She is not diaphoretic. No erythema.  Bruised and painful breasts with movement and deep breath  Psychiatric: Her mood appears not anxious. Her affect is not angry, not blunt, not labile and not inappropriate. Her speech is not rapid and/or pressured, not delayed, not tangential and not slurred. She is not agitated, not aggressive, not hyperactive, not slowed, not withdrawn, not actively hallucinating and not combative. Thought content is not paranoid and not delusional. Cognition and memory are impaired. She does not express impulsivity or inappropriate judgment. She exhibits a depressed mood. She is communicative. She exhibits abnormal recent memory. She exhibits normal remote memory.     Filed Vitals:   11/02/12 1215  BP: 131/67  Pulse: 61  Temp: 98.6 F (37 C)  TempSrc: Tympanic  Resp: 20      Labs reviewed: Basic Metabolic Panel:  Recent Labs  16/10/96 0525 12/06/11 0540 12/07/11 0417 03/22/12 04/22/12 10/27/12 10/30/12 1923  NA 139 138 138  --  141 141 140  K 3.8 4.5 4.1  --  4.1 3.7 3.6  CL 105 106 107  --   --   --  106  CO2 24 23 23   --   --   --   --   GLUCOSE 127* 120* 101*  --   --   --  129*  BUN 21 26* 25*  --  21 24* 23  CREATININE 1.06 1.00 0.94  --  1.1 1.1 1.40*  CALCIUM 8.2* 8.5 8.1*  --   --   --   --   TSH  --   --   --  3.24  --  4.51  --      Liver Function Tests:  Recent Labs  04/22/12 10/27/12  AST 31 25  ALT 14 9  ALKPHOS 73 58    CBC:  Recent Labs  12/02/11 1855  12/06/11 0540 12/07/11 0417 04/22/12 10/27/12 10/30/12 1915 10/30/12 1923  WBC 9.7  < > 11.5* 9.4 7.8 6.7 10.1  --   NEUTROABS 6.4  --   --   --   --   --   --   --   HGB 12.0  < > 8.6* 8.1* 12.2 11.8* 13.0 13.9  HCT 36.5  < > 26.7* 24.8* 37 35* 38.6 41.0  MCV 86.3  < > 87.8 87.3  --   --  85.8  --   PLT 168  < > 136* 131* 229 208 256  --   < > = values in this interval not displayed.  Anemia Panel: No results found for this basename: IRON, FOLATE, VITAMINB12,  in the last 8760 hours  Significant Diagnostic Results:  10/30/12 CXR IMPRESSION:  No active disease. No diagnostic pneumothorax  11/01/12 CXR/ribs: mild cardiomegaly slightly decreased with minimal pulmonary vascular congestion unchanged, patchy bibasilar atelectasis or interstitial pneumonitis appears new, no acute fracture or lytic destructive lesion involving the well visualized portions of the ribs. Old healed rib fracture deformity at the right fifth rib posterolaterally.   Assessment/Plan Contusion of chest S/p fall and ED evaluation, massive bruise seen in her breasts L>R and tender, repeated CXR 11/01/12 no acute fracture or lytic destructive lesion, pain mainly in her breasts--Norco tid and prn tolerated and effective. CBC and CMP   Hypothyroidism Stable. Update TSH. Continue Levothyroxine daily    HTN (hypertension) Controlled on Amlodipine. Update CMP    Depression Stable. Continue Celexa 40mg  and Remeron 7.5mg  at night.     Dementia Stable on Aricept, currently the patient is functioning well at AL level.         Family/ staff Communication: monitor for s/s of respiratory distress and supervision for safety.    Goals of care: AL and restore her previous functional level   Labs/tests ordered US of heart and lung. CBC, CMP, TSH.

## 2012-11-02 NOTE — Assessment & Plan Note (Signed)
Stable on Aricept, currently the patient is functioning well at AL level.              

## 2012-11-02 NOTE — Assessment & Plan Note (Addendum)
S/p fall and ED evaluation, massive bruise seen in her breasts L>R and tender, repeated CXR 11/01/12 no acute fracture or lytic destructive lesion, pain mainly in her breasts--Norco tid and prn tolerated and effective. CBC and CMP

## 2012-11-02 NOTE — Assessment & Plan Note (Signed)
Stable. Continue Celexa 40mg and Remeron 7.5mg at night.    

## 2012-11-02 NOTE — Assessment & Plan Note (Signed)
Controlled on Amlodipine. Update CMP

## 2012-11-03 LAB — CBC AND DIFFERENTIAL
HCT: 34 % — AB (ref 36–46)
Hemoglobin: 11.2 g/dL — AB (ref 12.0–16.0)
Platelets: 213 10*3/uL (ref 150–399)

## 2012-11-03 LAB — HEPATIC FUNCTION PANEL
ALT: 9 U/L (ref 7–35)
Alkaline Phosphatase: 51 U/L (ref 25–125)
Bilirubin, Total: 0.5 mg/dL

## 2012-11-03 LAB — BASIC METABOLIC PANEL
BUN: 21 mg/dL (ref 4–21)
Sodium: 141 mmol/L (ref 137–147)

## 2012-11-10 ENCOUNTER — Non-Acute Institutional Stay: Payer: Medicare Other | Admitting: Nurse Practitioner

## 2012-11-10 DIAGNOSIS — E039 Hypothyroidism, unspecified: Secondary | ICD-10-CM

## 2012-11-10 DIAGNOSIS — R0902 Hypoxemia: Secondary | ICD-10-CM

## 2012-11-10 DIAGNOSIS — R609 Edema, unspecified: Secondary | ICD-10-CM | POA: Insufficient documentation

## 2012-11-10 DIAGNOSIS — F0390 Unspecified dementia without behavioral disturbance: Secondary | ICD-10-CM

## 2012-11-10 DIAGNOSIS — F329 Major depressive disorder, single episode, unspecified: Secondary | ICD-10-CM

## 2012-11-10 DIAGNOSIS — Z5189 Encounter for other specified aftercare: Secondary | ICD-10-CM

## 2012-11-10 DIAGNOSIS — S20212D Contusion of left front wall of thorax, subsequent encounter: Secondary | ICD-10-CM

## 2012-11-10 DIAGNOSIS — L8990 Pressure ulcer of unspecified site, unspecified stage: Secondary | ICD-10-CM

## 2012-11-10 DIAGNOSIS — I10 Essential (primary) hypertension: Secondary | ICD-10-CM

## 2012-11-10 HISTORY — DX: Edema, unspecified: R60.9

## 2012-11-10 NOTE — Assessment & Plan Note (Signed)
Stable. Continue Celexa 40mg and Remeron 7.5mg at night.    

## 2012-11-10 NOTE — Assessment & Plan Note (Signed)
Trace in the left ankle/foot--echocardiogram 11/03/12 showed EF 65%--monitor.    

## 2012-11-10 NOTE — Assessment & Plan Note (Signed)
Elevated systolic Bp--asymptomatic, continue Amlodipine, monitor Bp

## 2012-11-10 NOTE — Assessment & Plan Note (Signed)
Updated TSH 5.82 11/03/12, will increase Levothyroxine to daily, f/u TSH in 12 weeks.

## 2012-11-10 NOTE — Assessment & Plan Note (Signed)
S/p fall and ED evaluation, massive bruise seen in her breasts L>R and tender, repeated CXR 11/01/12 no acute fracture or lytic destructive lesion, pain mainly in her breasts--Norco tid and prn tolerated and effective. Pain is better controlled

## 2012-11-10 NOTE — Assessment & Plan Note (Signed)
The left lateral aspect of the 4th toe due to overlap the left 5th toe exerting pressure to the area--toe separator between the left 4th and 5th toe for now.

## 2012-11-10 NOTE — Assessment & Plan Note (Signed)
Stable on Aricept, currently the patient is functioning well at AL level.              

## 2012-11-10 NOTE — Progress Notes (Signed)
Patient ID: Jasmin Rodriguez, female   DOB: February 06, 1914, 79 y.o.   MRN: [id number]  Chief Complaint:  Chief Complaint  Patient presents with  . Medical Managment of Chronic Issues    chest pain, O2 desaturation, lateral left 4th toe pressure sore, blood pressure.      HPI:   Problem List Items Addressed This Visit   HTN (hypertension)     Elevated systolic Bp--asymptomatic, continue Amlodipine, monitor Bp        Dementia     Stable on Aricept, currently the patient is functioning well at AL level.         Depression     Stable. Continue Celexa 40mg  and Remeron 7.5mg  at night.         Edema     Trace in the left ankle/foot--echocardiogram 11/03/12 showed EF 65%--monitor    Hypothyroidism - Primary (Chronic)      Updated TSH 5.82 11/03/12, will increase Levothyroxine to daily, f/u TSH in 12 weeks.         Contusion of chest (Chronic)     S/p fall and ED evaluation, massive bruise seen in her breasts L>R and tender, repeated CXR 11/01/12 no acute fracture or lytic destructive lesion, pain mainly in her breasts--Norco tid and prn tolerated and effective. Pain is better controlled      Pressure ulcer, unspecified site(707.00) (Chronic)     The left lateral aspect of the 4th toe due to overlap the left 5th toe exerting pressure to the area--toe separator between the left 4th and 5th toe for now.     Hypoxemia (Chronic)     Corrected with O2 2lpm via [place], likely due to shallow breathing from chest wall contusion.        Review of Systems:  Review of Systems  Constitutional: Negative for fever, chills, malaise/fatigue and diaphoresis.  HENT: Positive for hearing loss. Negative for ear pain, congestion, sore throat and neck pain.   Eyes: Negative for pain, discharge and redness.  Respiratory: Positive for shortness of breath (due to painful breathing, O2 2lpm via [place] helped. ). Negative for cough, sputum production and wheezing.   Cardiovascular: Positive for PND.  Negative for chest pain, palpitations, orthopnea, claudication and leg swelling.  Gastrointestinal: Negative for heartburn, nausea, vomiting, abdominal pain, diarrhea, constipation and blood in stool.  Genitourinary: Positive for frequency. Negative for dysuria, urgency, hematuria and flank pain.  Musculoskeletal: Positive for back pain, joint pain and falls. Negative for myalgias.  Skin: Negative for itching and rash.       Bruised breasts  Neurological: Negative for dizziness, tingling, tremors, sensory change, speech change, focal weakness, seizures, loss of consciousness, weakness and headaches.  Endo/Heme/Allergies: Negative for environmental allergies and polydipsia. Does not bruise/bleed easily.  Psychiatric/Behavioral: Positive for depression (flat affect) and memory loss. Negative for hallucinations. The patient is not nervous/anxious and does not have insomnia.      Medications: Patient's Medications  New Prescriptions   No medications on file  Previous Medications   AMLODIPINE (NORVASC) 10 MG TABLET    Take 10 mg by mouth every morning.   CALCIUM-VITAMIN D (OSCAL WITH D) 500-200 MG-UNIT PER TABLET    Take 1 tablet by mouth 2 (two) times daily.   CHOLECALCIFEROL (VITAMIN D) 1000 UNITS TABLET    Take 2,000 Units by mouth every morning.    CITALOPRAM (CELEXA) 40 MG TABLET    Take 40 mg by mouth every morning.    DONEPEZIL (ARICEPT) 10 MG TABLET  Take 10 mg by mouth at bedtime.   LEVOTHYROXINE (SYNTHROID, LEVOTHROID) 75 MCG TABLET    Take 75 mcg by mouth every morning.    MIRTAZAPINE (REMERON) 15 MG TABLET    Take 7.5 mg by mouth at bedtime.   MULTIPLE VITAMIN (MULITIVITAMIN WITH MINERALS) TABS    Take 1 tablet by mouth daily at 12 noon.    POLYETHYLENE GLYCOL (MIRALAX / GLYCOLAX) PACKET    Take 17 g by mouth every morning.  Modified Medications   No medications on file  Discontinued Medications   No medications on file     Physical Exam: Physical Exam  Constitutional: She  is oriented to person, place, and time. She appears well-developed and well-nourished. No distress.  HENT:  Head: Normocephalic and atraumatic.  Mouth/Throat: No oropharyngeal exudate.  Eyes: Conjunctivae and EOM are normal. Pupils are equal, round, and reactive to light. No scleral icterus.  Neck: Normal range of motion. Neck supple. No JVD present. No thyromegaly present.  Cardiovascular: Normal rate and regular rhythm.   No murmur heard. Pulmonary/Chest: Effort normal and breath sounds normal. No respiratory distress. She has no wheezes. She has no rales. She exhibits tenderness (cheat wall tenderness. ).  Abdominal: Soft. Bowel sounds are normal. She exhibits no distension. There is no tenderness.  Musculoskeletal: Normal range of motion. She exhibits edema (left ankle/foot). She exhibits no tenderness.  Lymphadenopathy:    She has no cervical adenopathy.  Neurological: She is alert and oriented to person, place, and time. She has normal reflexes. She displays normal reflexes. No cranial nerve deficit. She exhibits normal muscle tone. Coordination normal.  Skin: Skin is warm and dry. No rash noted. She is not diaphoretic. No erythema.  Bruised and painful breasts with movement and deep breath. The lateral aspect of the left 4th toe pressure area from the overlap 5th toe  Psychiatric: Her mood appears not anxious. Her affect is not angry, not blunt, not labile and not inappropriate. Her speech is not rapid and/or pressured, not delayed, not tangential and not slurred. She is not agitated, not aggressive, not hyperactive, not slowed, not withdrawn, not actively hallucinating and not combative. Thought content is not paranoid and not delusional. Cognition and memory are impaired. She does not express impulsivity or inappropriate judgment. She exhibits a depressed mood. She is communicative. She exhibits abnormal recent memory. She exhibits normal remote memory.     Filed Vitals:   11/10/12 1459   BP: 158/68  Pulse: 76  Temp: 98.7 F (37.1 C)  TempSrc: Tympanic  Resp: 18      Labs reviewed: Basic Metabolic Panel:  Recent Labs  81/19/14 0525 12/06/11 0540 12/07/11 0417 03/22/12  10/27/12 10/30/12 1923 11/03/12  NA 139 138 138  --   < > 141 140 141  K 3.8 4.5 4.1  --   < > 3.7 3.6 3.6  CL 105 106 107  --   --   --  106  --   CO2 24 23 23   --   --   --   --   --   GLUCOSE 127* 120* 101*  --   --   --  129*  --   BUN 21 26* 25*  --   < > 24* 23 21  CREATININE 1.06 1.00 0.94  --   < > 1.1 1.40* 1.2*  CALCIUM 8.2* 8.5 8.1*  --   --   --   --   --   TSH  --   --   --  3.24  --  4.51  --  5.82  < > = values in this interval not displayed.  Liver Function Tests:  Recent Labs  04/22/12 10/27/12 11/03/12  AST 31 25 20   ALT 14 9 9   ALKPHOS 73 58 51    CBC:  Recent Labs  12/02/11 1855  12/06/11 0540 12/07/11 0417  10/27/12 10/30/12 1915 10/30/12 1923 11/03/12  WBC 9.7  < > 11.5* 9.4  < > 6.7 10.1  --  7.3  NEUTROABS 6.4  --   --   --   --   --   --   --   --   HGB 12.0  < > 8.6* 8.1*  < > 11.8* 13.0 13.9 11.2*  HCT 36.5  < > 26.7* 24.8*  < > 35* 38.6 41.0 34*  MCV 86.3  < > 87.8 87.3  --   --  85.8  --   --   PLT 168  < > 136* 131*  < > 208 256  --  213  < > = values in this interval not displayed.  Anemia Panel: No results found for this basename: IRON, FOLATE, VITAMINB12,  in the last 8760 hours  Significant Diagnostic Results:     Assessment/Plan Hypothyroidism  Updated TSH 5.82 11/03/12, will increase Levothyroxine to daily, f/u TSH in 12 weeks.       Contusion of chest S/p fall and ED evaluation, massive bruise seen in her breasts L>R and tender, repeated CXR 11/01/12 no acute fracture or lytic destructive lesion, pain mainly in her breasts--Norco tid and prn tolerated and effective. Pain is better controlled    HTN (hypertension) Elevated systolic Bp--asymptomatic, continue Amlodipine, monitor Bp      Dementia Stable on  Aricept, currently the patient is functioning well at AL level.       Depression Stable. Continue Celexa 40mg  and Remeron 7.5mg  at night.       Pressure ulcer, unspecified site(707.00) The left lateral aspect of the 4th toe due to overlap the left 5th toe exerting pressure to the area--toe separator between the left 4th and 5th toe for now.   Edema Trace in the left ankle/foot--echocardiogram 11/03/12 showed EF 65%--monitor  Hypoxemia Corrected with O2 2lpm via [place], likely due to shallow breathing from chest wall contusion.       Family/ staff Communication: safety   Goals of care: AL   Labs/tests ordered TSH in 12 weeks.

## 2012-11-10 NOTE — Assessment & Plan Note (Signed)
Corrected with O2 2lpm via [place], likely due to shallow breathing from chest wall contusion.

## 2013-01-02 ENCOUNTER — Non-Acute Institutional Stay: Payer: Medicare Other | Admitting: Nurse Practitioner

## 2013-01-02 ENCOUNTER — Encounter: Payer: Self-pay | Admitting: Nurse Practitioner

## 2013-01-02 DIAGNOSIS — E039 Hypothyroidism, unspecified: Secondary | ICD-10-CM

## 2013-01-02 DIAGNOSIS — F329 Major depressive disorder, single episode, unspecified: Secondary | ICD-10-CM

## 2013-01-02 DIAGNOSIS — K5900 Constipation, unspecified: Secondary | ICD-10-CM

## 2013-01-02 DIAGNOSIS — F3289 Other specified depressive episodes: Secondary | ICD-10-CM

## 2013-01-02 DIAGNOSIS — N390 Urinary tract infection, site not specified: Secondary | ICD-10-CM

## 2013-01-02 DIAGNOSIS — Z66 Do not resuscitate: Secondary | ICD-10-CM

## 2013-01-02 DIAGNOSIS — I10 Essential (primary) hypertension: Secondary | ICD-10-CM

## 2013-01-02 HISTORY — DX: Constipation, unspecified: K59.00

## 2013-01-02 NOTE — Assessment & Plan Note (Signed)
Continue Levothyroxine daily since5/15/14 due to TSH 5.820 11/03/12--f/u TSH in 12 weeks scheduled.

## 2013-01-02 NOTE — Progress Notes (Signed)
Patient ID: Jasmin Rodriguez, female   DOB: [birth year], 79 y.o.   MRN: [id number] Code Status: DNR  Allergies  Allergen Reactions  . Tramadol   . Zoloft (Sertraline Hcl)     Chief Complaint  Patient presents with  . Medical Managment of Chronic Issues    confusion, fell x2 12/15/12. UTI    HPI: Patient is a 79 y.o. female seen in the AL at [facility] today for dementia, depression, HTN, UTI, and constipation Problem List Items Addressed This Visit   Depression     Stable. Continue Celexa 40mg  and Remeron 7.5mg  at night.         DNR (do not resuscitate)   HTN (hypertension)     Controlled and continue Amlodipine 10mg  daily          Hypothyroidism (Chronic)      Continue Levothyroxine daily since5/15/14 due to TSH 5.820 11/03/12--f/u TSH in 12 weeks scheduled.         Unspecified constipation     Stable on MiraLax daily.     Urinary tract infection, site not specified - Primary     Urine culture 12/21/12 showed C. Freundii and K. Pneumoniae >100,000c/ml, treated with Levofloxacin for total 7 days--clinically resolved and the patient's mentation has returned to her baseline.         Review of Systems:  Review of Systems  Constitutional: Negative for fever, chills, malaise/fatigue and diaphoresis.  HENT: Positive for hearing loss. Negative for ear pain, congestion, sore throat and neck pain.   Eyes: Negative for pain, discharge and redness.  Respiratory: Negative for cough, sputum production, shortness of breath (due to painful breathing, O2 2lpm via [place] helped. ) and wheezing.   Cardiovascular: Positive for PND. Negative for chest pain, palpitations, orthopnea, claudication and leg swelling.  Gastrointestinal: Negative for heartburn, nausea, vomiting, abdominal pain, diarrhea, constipation and blood in stool.  Genitourinary: Positive for frequency. Negative for dysuria, urgency, hematuria and flank pain.  Musculoskeletal: Positive for back pain,  joint pain and falls. Negative for myalgias.  Skin: Negative for itching and rash.       Bruised breasts-healed.   Neurological: Negative for dizziness, tingling, tremors, sensory change, speech change, focal weakness, seizures, loss of consciousness, weakness and headaches.  Endo/Heme/Allergies: Negative for environmental allergies and polydipsia. Does not bruise/bleed easily.  Psychiatric/Behavioral: Positive for depression (flat affect) and memory loss. Negative for hallucinations. The patient is not nervous/anxious and does not have insomnia.      Past Medical History  Diagnosis Date  . Hypertension   . Anemia   . Thyroid disease   . Hyperlipidemia   . Osteopenia   . Radial fracture   . Femur fracture    Past Surgical History  Procedure Laterality Date  . Hip fracture surgery     Social History:   reports that she has never smoked. She does not have any smokeless tobacco history on file. She reports that she does not drink alcohol or use illicit drugs.  History reviewed. No pertinent family history.  Medications: Patient's Medications  New Prescriptions   No medications on file  Previous Medications   AMLODIPINE (NORVASC) 10 MG TABLET    Take 10 mg by mouth every morning.   CALCIUM-VITAMIN D (OSCAL WITH D) 500-200 MG-UNIT PER TABLET    Take 1 tablet by mouth 2 (two) times daily.   CHOLECALCIFEROL (VITAMIN D) 1000 UNITS TABLET    Take 2,000 Units by mouth every morning.  CITALOPRAM (CELEXA) 40 MG TABLET    Take 40 mg by mouth every morning.    DONEPEZIL (ARICEPT) 10 MG TABLET    Take 10 mg by mouth at bedtime.   LEVOTHYROXINE (SYNTHROID, LEVOTHROID) 75 MCG TABLET    Take 75 mcg by mouth every morning.    MIRTAZAPINE (REMERON) 15 MG TABLET    Take 7.5 mg by mouth at bedtime.   MULTIPLE VITAMIN (MULITIVITAMIN WITH MINERALS) TABS    Take 1 tablet by mouth daily at 12 noon.    POLYETHYLENE GLYCOL (MIRALAX / GLYCOLAX) PACKET    Take 17 g by mouth every morning.  Modified  Medications   No medications on file  Discontinued Medications   No medications on file     Physical Exam: Physical Exam  Constitutional: She is oriented to person, place, and time. She appears well-developed and well-nourished. No distress.  HENT:  Head: Normocephalic and atraumatic.  Mouth/Throat: No oropharyngeal exudate.  Eyes: Conjunctivae and EOM are normal. Pupils are equal, round, and reactive to light. No scleral icterus.  Neck: Normal range of motion. Neck supple. No JVD present. No thyromegaly present.  Cardiovascular: Normal rate and regular rhythm.   No murmur heard. Pulmonary/Chest: Effort normal and breath sounds normal. No respiratory distress. She has no wheezes. She has no rales. She exhibits no tenderness (cheat wall tenderness. ).  Abdominal: Soft. Bowel sounds are normal. She exhibits no distension. There is no tenderness.  Musculoskeletal: Normal range of motion. She exhibits edema (left ankle/foot). She exhibits no tenderness.  Lymphadenopathy:    She has no cervical adenopathy.  Neurological: She is alert and oriented to person, place, and time. She has normal reflexes. She displays normal reflexes. No cranial nerve deficit. She exhibits normal muscle tone. Coordination normal.  Skin: Skin is warm and dry. No rash noted. She is not diaphoretic. No erythema.  Bruised and painful breasts with movement and deep breath. The lateral aspect of the left 4th toe pressure area from the overlap 5th toe  Psychiatric: Her mood appears not anxious. Her affect is not angry, not blunt, not labile and not inappropriate. Her speech is not rapid and/or pressured, not delayed, not tangential and not slurred. She is not agitated, not aggressive, not hyperactive, not slowed, not withdrawn, not actively hallucinating and not combative. Thought content is not paranoid and not delusional. Cognition and memory are impaired. She does not express impulsivity or inappropriate judgment. She  exhibits a depressed mood. She is communicative. She exhibits abnormal recent memory. She exhibits normal remote memory.    Filed Vitals:   01/02/13 1217  BP: 120/76  Pulse: 70  Temp: 98 F (36.7 C)  TempSrc: Tympanic  Resp: 18      Labs reviewed: Basic Metabolic Panel:  Recent Labs  16/10/96  10/27/12 10/30/12 1923 11/03/12  NA  --   < > 141 140 141  K  --   < > 3.7 3.6 3.6  CL  --   --   --  106  --   GLUCOSE  --   --   --  129*  --   BUN  --   < > 24* 23 21  CREATININE  --   < > 1.1 1.40* 1.2*  TSH 3.24  --  4.51  --  5.82  < > = values in this interval not displayed. Liver Function Tests:  Recent Labs  04/22/12 10/27/12 11/03/12  AST 31 25 20   ALT 14 9 9  ALKPHOS 73 58 51   No results found for this basename: LIPASE, AMYLASE,  in the last 8760 hours No results found for this basename: AMMONIA,  in the last 8760 hours CBC:  Recent Labs  10/27/12 10/30/12 1915 10/30/12 1923 11/03/12  WBC 6.7 10.1  --  7.3  HGB 11.8* 13.0 13.9 11.2*  HCT 35* 38.6 41.0 34*  MCV  --  85.8  --   --   PLT 208 256  --  213   Lipid Panel: No results found for this basename: CHOL, HDL, LDLCALC, TRIG, CHOLHDL, LDLDIRECT,  in the last 8760 hours Anemia Panel: No results found for this basename: FOLATE, IRON, VITAMINB12,  in the last 8760 hours  Past Procedures:     Assessment/Plan Urinary tract infection, site not specified Urine culture 12/21/12 showed C. Freundii and K. Pneumoniae >100,000c/ml, treated with Levofloxacin for total 7 days--clinically resolved and the patient's mentation has returned to her baseline.    HTN (hypertension) Controlled and continue Amlodipine 10mg  daily        Depression Stable. Continue Celexa 40mg  and Remeron 7.5mg  at night.       Dementia Stable on Aricept, currently the patient is functioning well at AL level.         Hypothyroidism  Continue Levothyroxine daily since5/15/14 due to TSH 5.820 11/03/12--f/u TSH in  12 weeks scheduled.       Unspecified constipation Stable on MiraLax daily.     Family/ Staff Communication:  Observe the patient.   Goals of Care: AL  Labs/tests ordered: none

## 2013-01-02 NOTE — Assessment & Plan Note (Signed)
Stable. Continue Celexa 40mg and Remeron 7.5mg at night.    

## 2013-01-02 NOTE — Assessment & Plan Note (Signed)
Stable on Aricept, currently the patient is functioning well at AL level.

## 2013-01-02 NOTE — Assessment & Plan Note (Signed)
Stable on MiraLax daily.  

## 2013-01-02 NOTE — Assessment & Plan Note (Signed)
Urine culture 12/21/12 showed C. Freundii and K. Pneumoniae >100,000c/ml, treated with Levofloxacin for total 7 days--clinically resolved and the patient's mentation has returned to her baseline.

## 2013-01-02 NOTE — Assessment & Plan Note (Signed)
Controlled and continue Amlodipine 10mg daily  

## 2013-01-09 ENCOUNTER — Encounter: Payer: Self-pay | Admitting: Nurse Practitioner

## 2013-01-09 ENCOUNTER — Non-Acute Institutional Stay: Payer: Medicare Other | Admitting: Nurse Practitioner

## 2013-01-09 DIAGNOSIS — H1132 Conjunctival hemorrhage, left eye: Secondary | ICD-10-CM

## 2013-01-09 DIAGNOSIS — F329 Major depressive disorder, single episode, unspecified: Secondary | ICD-10-CM

## 2013-01-09 DIAGNOSIS — E039 Hypothyroidism, unspecified: Secondary | ICD-10-CM

## 2013-01-09 DIAGNOSIS — E860 Dehydration: Secondary | ICD-10-CM

## 2013-01-09 DIAGNOSIS — F0390 Unspecified dementia without behavioral disturbance: Secondary | ICD-10-CM

## 2013-01-09 DIAGNOSIS — H1130 Conjunctival hemorrhage, unspecified eye: Secondary | ICD-10-CM | POA: Insufficient documentation

## 2013-01-09 DIAGNOSIS — K5900 Constipation, unspecified: Secondary | ICD-10-CM

## 2013-01-09 NOTE — Assessment & Plan Note (Signed)
Stable on Aricept, currently the patient is functioning well at AL level.              

## 2013-01-09 NOTE — Assessment & Plan Note (Signed)
Stable. Continue Celexa 40mg and Remeron 7.5mg at night.    

## 2013-01-09 NOTE — Assessment & Plan Note (Addendum)
Left medial-no change in vision, denied pain/ithcing/scratching sensation. It should heal without intervention.

## 2013-01-09 NOTE — Assessment & Plan Note (Signed)
Stable on MiraLax daily.  

## 2013-01-09 NOTE — Progress Notes (Signed)
Patient ID: Jasmin Rodriguez, female   DOB: 1914/06/04, 79 y.o.   MRN: [id number] Code Status: DNR  Allergies  Allergen Reactions  . Tramadol   . Zoloft (Sertraline Hcl)     Chief Complaint  Patient presents with  . Medical Managment of Chronic Issues    left medial subconjunctival hemorrhage.     HPI: Patient is a 79 y.o. female seen in the AL[facility]  at [facility] today for evaluation of redness of the left eye and evaluation of her chronic medical conditions.  Problem List Items Addressed This Visit   Dehydration     Controlled and continue Amlodipine 10mg  daily            Dementia     Stable on Aricept, currently the patient is functioning well at AL level.             Depression     Stable. Continue Celexa 40mg  and Remeron 7.5mg  at night.           Hypothyroidism (Chronic)      Continue Levothyroxine daily since5/15/14 due to TSH 5.820 11/03/12--f/u TSH in 12 weeks scheduled.           Subconjunctival bleed - Primary     Left medial-no change in vision, denied pain/ithcing/scratching sensation. It should heal without intervention.     Unspecified constipation     Stable on MiraLax daily.          Review of Systems:  Review of Systems  Constitutional: Negative for fever, chills, malaise/fatigue and diaphoresis.  HENT: Positive for hearing loss. Negative for ear pain, congestion, sore throat and neck pain.   Eyes: Negative for pain, discharge and redness.  Respiratory: Negative for cough, sputum production, shortness of breath (due to painful breathing, O2 2lpm via [place] helped. ) and wheezing.   Cardiovascular: Positive for PND. Negative for chest pain, palpitations, orthopnea, claudication and leg swelling.  Gastrointestinal: Negative for heartburn, nausea, vomiting, abdominal pain, diarrhea, constipation and blood in stool.  Genitourinary: Positive for frequency. Negative for dysuria, urgency, hematuria and flank pain.   Musculoskeletal: Positive for back pain, joint pain and falls. Negative for myalgias.  Skin: Negative for itching and rash.       Bruised breasts-healed.   Neurological: Negative for dizziness, tingling, tremors, sensory change, speech change, focal weakness, seizures, loss of consciousness, weakness and headaches.  Endo/Heme/Allergies: Negative for environmental allergies and polydipsia. Does not bruise/bleed easily.  Psychiatric/Behavioral: Positive for depression (flat affect) and memory loss. Negative for hallucinations. The patient is not nervous/anxious and does not have insomnia.      Past Medical History  Diagnosis Date  . Hypertension   . Anemia   . Thyroid disease   . Hyperlipidemia   . Osteopenia   . Radial fracture   . Femur fracture    Past Surgical History  Procedure Laterality Date  . Hip fracture surgery     Social History:   reports that she has never smoked. She does not have any smokeless tobacco history on file. She reports that she does not drink alcohol or use illicit drugs.  History reviewed. No pertinent family history.  Medications: Patient's Medications  New Prescriptions   No medications on file  Previous Medications   AMLODIPINE (NORVASC) 10 MG TABLET    Take 10 mg by mouth every morning.   CALCIUM-VITAMIN D (OSCAL WITH D) 500-200 MG-UNIT PER TABLET    Take 1 tablet by mouth 2 (two) times daily.  CHOLECALCIFEROL (VITAMIN D) 1000 UNITS TABLET    Take 2,000 Units by mouth every morning.    CITALOPRAM (CELEXA) 40 MG TABLET    Take 40 mg by mouth every morning.    DONEPEZIL (ARICEPT) 10 MG TABLET    Take 10 mg by mouth at bedtime.   LEVOTHYROXINE (SYNTHROID, LEVOTHROID) 75 MCG TABLET    Take 75 mcg by mouth every morning.    MIRTAZAPINE (REMERON) 15 MG TABLET    Take 7.5 mg by mouth at bedtime.   MULTIPLE VITAMIN (MULITIVITAMIN WITH MINERALS) TABS    Take 1 tablet by mouth daily at 12 noon.    POLYETHYLENE GLYCOL (MIRALAX / GLYCOLAX) PACKET    Take  17 g by mouth every morning.  Modified Medications   No medications on file  Discontinued Medications   No medications on file     Physical Exam: Physical Exam  Constitutional: She is oriented to person, place, and time. She appears well-developed and well-nourished. No distress.  HENT:  Head: Normocephalic and atraumatic.  Mouth/Throat: No oropharyngeal exudate.  Eyes: EOM are normal. Pupils are equal, round, and reactive to light. Right conjunctiva has a hemorrhage (left medial). No scleral icterus.  Neck: Normal range of motion. Neck supple. No JVD present. No thyromegaly present.  Cardiovascular: Normal rate and regular rhythm.   No murmur heard. Pulmonary/Chest: Effort normal and breath sounds normal. No respiratory distress. She has no wheezes. She has no rales. She exhibits no tenderness (cheat wall tenderness. ).  Abdominal: Soft. Bowel sounds are normal. She exhibits no distension. There is no tenderness.  Musculoskeletal: Normal range of motion. She exhibits edema (left ankle/foot). She exhibits no tenderness.  Lymphadenopathy:    She has no cervical adenopathy.  Neurological: She is alert and oriented to person, place, and time. She has normal reflexes. She displays normal reflexes. No cranial nerve deficit. She exhibits normal muscle tone. Coordination normal.  Skin: Skin is warm and dry. No rash noted. She is not diaphoretic. No erythema.  Bruised and painful breasts with movement and deep breath. The lateral aspect of the left 4th toe pressure area from the overlap 5th toe  Psychiatric: Her mood appears not anxious. Her affect is not angry, not blunt, not labile and not inappropriate. Her speech is not rapid and/or pressured, not delayed, not tangential and not slurred. She is not agitated, not aggressive, not hyperactive, not slowed, not withdrawn, not actively hallucinating and not combative. Thought content is not paranoid and not delusional. Cognition and memory are  impaired. She does not express impulsivity or inappropriate judgment. She exhibits a depressed mood. She is communicative. She exhibits abnormal recent memory. She exhibits normal remote memory.    Filed Vitals:   01/09/13 1444  BP: 124/77  Pulse: 74  Temp: 98.8 F (37.1 C)  TempSrc: Tympanic  Resp: 20      Labs reviewed: Basic Metabolic Panel:  Recent Labs  16/10/96  10/27/12 10/30/12 1923 11/03/12  NA  --   < > 141 140 141  K  --   < > 3.7 3.6 3.6  CL  --   --   --  106  --   GLUCOSE  --   --   --  129*  --   BUN  --   < > 24* 23 21  CREATININE  --   < > 1.1 1.40* 1.2*  TSH 3.24  --  4.51  --  5.82  < > = values in this  interval not displayed. Liver Function Tests:  Recent Labs  04/22/12 10/27/12 11/03/12  AST 31 25 20   ALT 14 9 9   ALKPHOS 73 58 51   No results found for this basename: LIPASE, AMYLASE,  in the last 8760 hours No results found for this basename: AMMONIA,  in the last 8760 hours CBC:  Recent Labs  10/27/12 10/30/12 1915 10/30/12 1923 11/03/12  WBC 6.7 10.1  --  7.3  HGB 11.8* 13.0 13.9 11.2*  HCT 35* 38.6 41.0 34*  MCV  --  85.8  --   --   PLT 208 256  --  213   Lipid Panel: No results found for this basename: CHOL, HDL, LDLCALC, TRIG, CHOLHDL, LDLDIRECT,  in the last 8760 hours Anemia Panel: No results found for this basename: FOLATE, IRON, VITAMINB12,  in the last 8760 hours  Past Procedures:     Assessment/Plan Subconjunctival bleed Left medial-no change in vision, denied pain/ithcing/scratching sensation. It should heal without intervention.   Dehydration Controlled and continue Amlodipine 10mg  daily          Depression Stable. Continue Celexa 40mg  and Remeron 7.5mg  at night.         Hypothyroidism  Continue Levothyroxine daily since5/15/14 due to TSH 5.820 11/03/12--f/u TSH in 12 weeks scheduled.         Unspecified constipation Stable on MiraLax daily.     Dementia Stable on Aricept,  currently the patient is functioning well at AL level.             Family/ Staff Communication: observe the patient.   Goals of Care: AL  Labs/tests ordered: none.

## 2013-01-09 NOTE — Assessment & Plan Note (Signed)
Continue Levothyroxine 100mcg daily since5/15/14 due to TSH 5.820 11/03/12--f/u TSH in 12 weeks scheduled.      

## 2013-01-09 NOTE — Assessment & Plan Note (Signed)
Controlled and continue Amlodipine 10mg daily  

## 2013-03-10 ENCOUNTER — Encounter: Payer: Self-pay | Admitting: Nurse Practitioner

## 2013-06-08 ENCOUNTER — Non-Acute Institutional Stay: Payer: Medicare Other | Admitting: Nurse Practitioner

## 2013-06-08 DIAGNOSIS — I10 Essential (primary) hypertension: Secondary | ICD-10-CM

## 2013-06-08 DIAGNOSIS — F0390 Unspecified dementia without behavioral disturbance: Secondary | ICD-10-CM

## 2013-06-08 DIAGNOSIS — E039 Hypothyroidism, unspecified: Secondary | ICD-10-CM

## 2013-06-08 DIAGNOSIS — R609 Edema, unspecified: Secondary | ICD-10-CM

## 2013-06-08 DIAGNOSIS — F329 Major depressive disorder, single episode, unspecified: Secondary | ICD-10-CM

## 2013-06-08 DIAGNOSIS — J209 Acute bronchitis, unspecified: Secondary | ICD-10-CM

## 2013-06-08 DIAGNOSIS — K5900 Constipation, unspecified: Secondary | ICD-10-CM

## 2013-06-10 ENCOUNTER — Encounter: Payer: Self-pay | Admitting: Nurse Practitioner

## 2013-06-10 DIAGNOSIS — J209 Acute bronchitis, unspecified: Secondary | ICD-10-CM | POA: Insufficient documentation

## 2013-06-10 NOTE — Assessment & Plan Note (Signed)
Stable on Aricept, currently the patient is functioning well at AL level.              

## 2013-06-10 NOTE — Assessment & Plan Note (Addendum)
Fever and congestive cough for 2-3 days, noted expiratory wheezes, no O2 desaturation. CXR to r/o PNA. UA C/S to r/o UTI. DuoNeb q6hr for 5 days then prn for 2 weeks. Mucinex 600mg  bid for 5 days. Avelox 400mg  po daily for 10 days. Medrol dose pack. Update CBC

## 2013-06-10 NOTE — Assessment & Plan Note (Signed)
Stable. Continue Celexa 40mg and Remeron 7.5mg at night.    

## 2013-06-10 NOTE — Progress Notes (Signed)
Patient ID: Jasmin Rodriguez, female   DOB: 15-May-1914, 79 y.o.   MRN: [id number]   Code Status: DNR  Allergies  Allergen Reactions  . Tramadol   . Zoloft [Sertraline Hcl]     Chief Complaint  Patient presents with  . Medical Managment of Chronic Issues    fever and congestive cough for 2-3 days.   . Acute Visit    HPI: Patient is a 79 y.o. female seen in the [facility] at [facility] today for evaluation of congestive cough, fever for 2-3 days and other chronic medical conditions.  Problem List Items Addressed This Visit   Hypothyroidism (Chronic)      Continue Levothyroxine daily since5/15/14 due to TSH 5.820 11/03/12--f/u TSH             HTN (hypertension)     Controlled and continue Amlodipine 10mg  daily            Dementia     Stable on Aricept, currently the patient is functioning well at AL level.               Depression     Stable. Continue Celexa 40mg  and Remeron 7.5mg  at night.             Edema     Trace in the left ankle/foot--echocardiogram 11/03/12 showed EF 65%--monitor. Update CMP      Unspecified constipation     Stable on MiraLax daily.         Acute bronchitis - Primary     Fever and congestive cough for 2-3 days, noted expiratory wheezes, no O2 desaturation. CXR to r/o PNA. UA C/S to r/o UTI. DuoNeb q6hr for 5 days then prn for 2 weeks. Mucinex 600mg  bid for 5 days. Avelox 400mg  po daily for 10 days. Medrol dose pack. Update CBC       Review of Systems:  Review of Systems  Constitutional: Positive for fever. Negative for chills, malaise/fatigue and diaphoresis.  HENT: Positive for hearing loss. Negative for congestion, ear pain and sore throat.   Eyes: Negative for pain, discharge and redness.  Respiratory: Positive for cough. Negative for sputum production, shortness of breath (due to painful breathing, O2 2lpm via [place] helped. ) and wheezing.        Congestive cough  Cardiovascular: Positive for  PND. Negative for chest pain, palpitations, orthopnea, claudication and leg swelling.  Gastrointestinal: Negative for heartburn, nausea, vomiting, abdominal pain, diarrhea, constipation and blood in stool.  Genitourinary: Positive for frequency. Negative for dysuria, urgency, hematuria and flank pain.  Musculoskeletal: Positive for back pain, falls and joint pain. Negative for myalgias and neck pain.  Skin: Negative for itching and rash.       Bruised breasts-healed.   Neurological: Negative for dizziness, tingling, tremors, sensory change, speech change, focal weakness, seizures, loss of consciousness, weakness and headaches.  Endo/Heme/Allergies: Negative for environmental allergies and polydipsia. Does not bruise/bleed easily.  Psychiatric/Behavioral: Positive for depression (flat affect) and memory loss. Negative for hallucinations. The patient is not nervous/anxious and does not have insomnia.      Past Medical History  Diagnosis Date  . Hypertension   . Anemia   . Thyroid disease   . Hyperlipidemia   . Osteopenia   . Radial fracture   . Femur fracture    Past Surgical History  Procedure Laterality Date  . Hip fracture surgery     Social History:   reports that she has never smoked. She does not  have any smokeless tobacco history on file. She reports that she does not drink alcohol or use illicit drugs.  Medications: Patient's Medications  New Prescriptions   No medications on file  Previous Medications   AMLODIPINE (NORVASC) 10 MG TABLET    Take 10 mg by mouth every morning.   CALCIUM CARB-CHOLECALCIFEROL (OYSTER SHELL CALCIUM + D PO)    Take by mouth. 500 mg-200 mg, 1 by mouth twice daily   CHOLECALCIFEROL (VITAMIN D) 1000 UNITS TABLET    Take 1,000 Units by mouth. 2 by mouth daily   CITALOPRAM (CELEXA) 40 MG TABLET    Take 40 mg by mouth every morning.    DONEPEZIL (ARICEPT) 10 MG TABLET    Take 10 mg by mouth at bedtime.   HYDROCODONE-ACETAMINOPHEN (NORCO/VICODIN)  5-325 MG PER TABLET    Take 1 tablet by mouth. 1 by mouth twice daily as needed for breakthrough pain   LEVOTHYROXINE (SYNTHROID, LEVOTHROID) 100 MCG TABLET    Take 100 mcg by mouth daily before breakfast. **Check pulse weekly on Monday**   MIRTAZAPINE (REMERON) 15 MG TABLET    Take 7.5 mg by mouth at bedtime.   MULTIPLE VITAMIN (THERA/BETA-CAROTENE) TABS    Take by mouth. 1 by mouth daily   POLYETHYLENE GLYCOL (MIRALAX / GLYCOLAX) PACKET    Take 17 g by mouth. Fill cap to 17 GM mark, mix with 4-8 ounces of fluid and take by mouth every day  Modified Medications   No medications on file  Discontinued Medications   No medications on file     Physical Exam: Physical Exam  Constitutional: She is oriented to person, place, and time. She appears well-developed and well-nourished. No distress.  HENT:  Head: Normocephalic and atraumatic.  Mouth/Throat: No oropharyngeal exudate.  Eyes: EOM are normal. Pupils are equal, round, and reactive to light. Right conjunctiva has a hemorrhage (left medial). No scleral icterus.  Neck: Neck supple. No JVD present. No thyromegaly present.  Cardiovascular: Normal rate and regular rhythm.   No murmur heard. Pulmonary/Chest: Effort normal. No respiratory distress. She has wheezes. She has no rales. She exhibits no tenderness (cheat wall tenderness. ).  Central congestion.   Abdominal: Soft. Bowel sounds are normal. She exhibits no distension. There is no tenderness.  Musculoskeletal: Normal range of motion. She exhibits edema (left ankle/foot). She exhibits no tenderness.  Lymphadenopathy:    She has no cervical adenopathy.  Neurological: She is alert and oriented to person, place, and time. She has normal reflexes. She displays normal reflexes. No cranial nerve deficit. She exhibits normal muscle tone. Coordination normal.  Skin: Skin is warm and dry. No rash noted. She is not diaphoretic. No erythema.  Bruised and painful breasts with movement and deep  breath. The lateral aspect of the left 4th toe pressure area from the overlap 5th toe  Psychiatric: Her mood appears not anxious. Her affect is not angry, not blunt, not labile and not inappropriate. Her speech is not rapid and/or pressured, not delayed, not tangential and not slurred. She is not agitated, not aggressive, not hyperactive, not slowed, not withdrawn, not actively hallucinating and not combative. Thought content is not paranoid and not delusional. Cognition and memory are impaired. She does not express impulsivity or inappropriate judgment. She exhibits a depressed mood. She is communicative. She exhibits abnormal recent memory. She exhibits normal remote memory.    Filed Vitals:   06/08/13 1510  BP: 104/70  Pulse: 73  Temp: 101.7 F (38.7 C)  TempSrc:  Tympanic  Resp: 18      Labs reviewed: Basic Metabolic Panel:  Recent Labs  32/44/01 10/30/12 1923 11/03/12  NA 141 140 141  K 3.7 3.6 3.6  CL  --  106  --   GLUCOSE  --  129*  --   BUN 24* 23 21  CREATININE 1.1 1.40* 1.2*  TSH 4.51  --  5.82   Liver Function Tests:  Recent Labs  10/27/12 11/03/12  AST 25 20  ALT 9 9  ALKPHOS 58 51   CBC:  Recent Labs  10/27/12 10/30/12 1915 10/30/12 1923 11/03/12  WBC 6.7 10.1  --  7.3  HGB 11.8* 13.0 13.9 11.2*  HCT 35* 38.6 41.0 34*  MCV  --  85.8  --   --   PLT 208 256  --  213   Past Procedures:  06/08/13 CXR pending.   Assessment/Plan Acute bronchitis Fever and congestive cough for 2-3 days, noted expiratory wheezes, no O2 desaturation. CXR to r/o PNA. UA C/S to r/o UTI. DuoNeb q6hr for 5 days then prn for 2 weeks. Mucinex 600mg  bid for 5 days. Avelox 400mg  po daily for 10 days. Medrol dose pack. Update CBC  Hypothyroidism  Continue Levothyroxine daily since5/15/14 due to TSH 5.820 11/03/12--f/u TSH           HTN (hypertension) Controlled and continue Amlodipine 10mg  daily          Dementia Stable on Aricept, currently the patient  is functioning well at AL level.             Depression Stable. Continue Celexa 40mg  and Remeron 7.5mg  at night.           Edema Trace in the left ankle/foot--echocardiogram 11/03/12 showed EF 65%--monitor. Update CMP    Unspecified constipation Stable on MiraLax daily.         Family/ Staff Communication: observe the patient.   Goals of Care: AL  Labs/tests ordered: CBC, CMP, CXR, UA C/S, CXR

## 2013-06-10 NOTE — Assessment & Plan Note (Signed)
Stable on MiraLax daily.  

## 2013-06-10 NOTE — Assessment & Plan Note (Signed)
Trace in the left ankle/foot--echocardiogram 11/03/12 showed EF 65%--monitor. Update CMP

## 2013-06-10 NOTE — Assessment & Plan Note (Signed)
Continue Levothyroxine daily since5/15/14 due to TSH 5.820 11/03/12--f/u TSH

## 2013-06-10 NOTE — Assessment & Plan Note (Signed)
Controlled and continue Amlodipine 10mg daily  

## 2013-06-15 LAB — BASIC METABOLIC PANEL
BUN: 19 mg/dL (ref 4–21)
Creatinine: 1.5 mg/dL — AB (ref 0.5–1.1)
Glucose: 93 mg/dL
Potassium: 3.6 mmol/L (ref 3.4–5.3)
Sodium: 136 mmol/L — AB (ref 137–147)

## 2013-06-15 LAB — CBC AND DIFFERENTIAL: Hemoglobin: 11.5 g/dL — AB (ref 12.0–16.0)

## 2013-06-15 LAB — HEPATIC FUNCTION PANEL: AST: 54 U/L — AB (ref 13–35)

## 2013-06-21 ENCOUNTER — Non-Acute Institutional Stay: Payer: Medicare Other | Admitting: Nurse Practitioner

## 2013-06-21 ENCOUNTER — Encounter: Payer: Self-pay | Admitting: Nurse Practitioner

## 2013-06-21 DIAGNOSIS — M542 Cervicalgia: Secondary | ICD-10-CM

## 2013-06-21 DIAGNOSIS — F0390 Unspecified dementia without behavioral disturbance: Secondary | ICD-10-CM

## 2013-06-21 DIAGNOSIS — R609 Edema, unspecified: Secondary | ICD-10-CM

## 2013-06-21 DIAGNOSIS — K5900 Constipation, unspecified: Secondary | ICD-10-CM

## 2013-06-21 DIAGNOSIS — J209 Acute bronchitis, unspecified: Secondary | ICD-10-CM

## 2013-06-21 DIAGNOSIS — I10 Essential (primary) hypertension: Secondary | ICD-10-CM

## 2013-06-21 DIAGNOSIS — E039 Hypothyroidism, unspecified: Secondary | ICD-10-CM

## 2013-06-21 DIAGNOSIS — F329 Major depressive disorder, single episode, unspecified: Secondary | ICD-10-CM

## 2013-06-21 DIAGNOSIS — J984 Other disorders of lung: Secondary | ICD-10-CM | POA: Insufficient documentation

## 2013-06-21 DIAGNOSIS — J189 Pneumonia, unspecified organism: Secondary | ICD-10-CM

## 2013-06-21 NOTE — Assessment & Plan Note (Signed)
Controlled and continue Amlodipine 10mg daily  

## 2013-06-21 NOTE — Assessment & Plan Note (Signed)
Stable on Aricept, currently the patient is functioning well at AL level.

## 2013-06-21 NOTE — Assessment & Plan Note (Signed)
Congestive cough, X-ray 06/08/13 patchy bibasilar atelectasis or interstitial pneumonitis-fully treated with 10 day course of Avelox, Deb q6h and Mucinex bid for 5 days, and Medrol dose pk 06/08/13. Only occasional hacking cough noted today.

## 2013-06-21 NOTE — Assessment & Plan Note (Signed)
Stable on MiraLax daily.  

## 2013-06-21 NOTE — Assessment & Plan Note (Signed)
Stable. Continue Celexa 40mg and Remeron 7.5mg at night.    

## 2013-06-21 NOTE — Assessment & Plan Note (Signed)
Continue Levothyroxine daily since5/15/14 due to TSH 5.820 11/03/12

## 2013-06-21 NOTE — Progress Notes (Signed)
Patient ID: Jasmin Rodriguez, female   DOB: 07/30/1913, 79 y.o.   MRN: [id number]   Code Status: DNR  Allergies  Allergen Reactions  . Tramadol   . Zoloft [Sertraline Hcl]     Chief Complaint  Patient presents with  . Medical Managment of Chronic Issues    neck pain.   . Acute Visit    HPI: Patient is a 79 y.o. female seen in the [facility] at [facility] today for evaluation of treated PNA, c/o nape pain today,  and other chronic medical conditions.  Problem List Items Addressed This Visit   Hypothyroidism (Chronic)      Continue Levothyroxine daily since5/15/14 due to TSH 5.820 11/03/12              HTN (hypertension)     Controlled and continue Amlodipine 10mg  daily            Dementia     Stable on Aricept, currently the patient is functioning well at AL level.                 Depression     Stable. Continue Celexa 40mg  and Remeron 7.5mg  at night.               Edema     Trace in the left ankle/foot--echocardiogram 11/03/12 showed EF 65%--monitor.         Unspecified constipation     Stable on MiraLax daily.           Acute bronchitis     Resolved fever,  congestive cough, and expiratory wheezes,  CXR 06/08/13 bibasilar pneumonitis. Completed DuoNeb q6hr for 5 days then prn for 2 weeks. Mucinex 600mg  bid for 5 days. Avelox 400mg  po daily for 10 days. Medrol dose pack. Healing nicely. Occasional hacking cough noted.       Pneumonitis - Primary     Congestive cough, X-ray 06/08/13 patchy bibasilar atelectasis or interstitial pneumonitis-fully treated with 10 day course of Avelox, Deb q6h and Mucinex bid for 5 days, and Medrol dose pk 06/08/13. Only occasional hacking cough noted today.     Posterior neck pain     New for 2 days--nape muscle targeted point pain palpated, reproduced with ROM of neck, no focal neurological deficit or AMS, will have Norco prn available to her. Warm compress 41min/each up to 4xday.  May consider X-ray of Cervical spine. ESR and RAF if no better.        Review of Systems:  Review of Systems  Constitutional: Positive for fever. Negative for chills, malaise/fatigue and diaphoresis.  HENT: Positive for hearing loss. Negative for congestion, ear pain and sore throat.   Eyes: Negative for pain, discharge and redness.  Respiratory: Positive for cough. Negative for sputum production, shortness of breath and wheezing.        Congestive cough-much better.   Cardiovascular: Positive for PND. Negative for chest pain, palpitations, orthopnea, claudication and leg swelling.  Gastrointestinal: Negative for heartburn, nausea, vomiting, abdominal pain, diarrhea, constipation and blood in stool.  Genitourinary: Positive for frequency. Negative for dysuria, urgency, hematuria and flank pain.  Musculoskeletal: Positive for back pain, falls, joint pain and neck pain. Negative for myalgias.       Nape in muscle.   Skin: Negative for itching and rash.  Neurological: Negative for dizziness, tingling, tremors, sensory change, speech change, focal weakness, seizures, loss of consciousness, weakness and headaches.  Endo/Heme/Allergies: Negative for environmental allergies and polydipsia. Does not bruise/bleed easily.  Psychiatric/Behavioral: Positive for depression (flat affect) and memory loss. Negative for hallucinations. The patient is not nervous/anxious and does not have insomnia.      Past Medical History  Diagnosis Date  . Hypertension   . Anemia   . Thyroid disease   . Hyperlipidemia   . Osteopenia   . Radial fracture   . Femur fracture    Past Surgical History  Procedure Laterality Date  . Hip fracture surgery     Social History:   reports that she has never smoked. She does not have any smokeless tobacco history on file. She reports that she does not drink alcohol or use illicit drugs.  Medications: Patient's Medications  New Prescriptions   No medications on file    Previous Medications   AMLODIPINE (NORVASC) 10 MG TABLET    Take 10 mg by mouth every morning.   CALCIUM CARB-CHOLECALCIFEROL (OYSTER SHELL CALCIUM + D PO)    Take by mouth. 500 mg-200 mg, 1 by mouth twice daily   CHOLECALCIFEROL (VITAMIN D) 1000 UNITS TABLET    Take 1,000 Units by mouth. 2 by mouth daily   CITALOPRAM (CELEXA) 40 MG TABLET    Take 40 mg by mouth every morning.    DONEPEZIL (ARICEPT) 10 MG TABLET    Take 10 mg by mouth at bedtime.   HYDROCODONE-ACETAMINOPHEN (NORCO/VICODIN) 5-325 MG PER TABLET    Take 1 tablet by mouth. 1 by mouth twice daily as needed for breakthrough pain   LEVOTHYROXINE (SYNTHROID, LEVOTHROID) 100 MCG TABLET    Take 100 mcg by mouth daily before breakfast. **Check pulse weekly on Monday**   MIRTAZAPINE (REMERON) 15 MG TABLET    Take 7.5 mg by mouth at bedtime.   MULTIPLE VITAMIN (THERA/BETA-CAROTENE) TABS    Take by mouth. 1 by mouth daily   POLYETHYLENE GLYCOL (MIRALAX / GLYCOLAX) PACKET    Take 17 g by mouth. Fill cap to 17 GM mark, mix with 4-8 ounces of fluid and take by mouth every day  Modified Medications   No medications on file  Discontinued Medications   No medications on file     Physical Exam: Physical Exam  Constitutional: She is oriented to person, place, and time. She appears well-developed and well-nourished. No distress.  HENT:  Head: Normocephalic and atraumatic.  Mouth/Throat: No oropharyngeal exudate.  Eyes: EOM are normal. Pupils are equal, round, and reactive to light. Right conjunctiva has a hemorrhage (left medial). No scleral icterus.  Neck: Neck supple. No JVD present. No thyromegaly present.  Cardiovascular: Normal rate and regular rhythm.   No murmur heard. Pulmonary/Chest: Effort normal. No respiratory distress. She has no wheezes. She has no rales. She exhibits no tenderness (cheat wall tenderness. ).  Abdominal: Soft. Bowel sounds are normal. She exhibits no distension. There is no tenderness.  Musculoskeletal: Normal  range of motion. She exhibits edema (left ankle/foot). She exhibits no tenderness.  Pain in her nape muscle. Neck is supple.   Lymphadenopathy:    She has no cervical adenopathy.  Neurological: She is alert and oriented to person, place, and time. She has normal reflexes. No cranial nerve deficit. She exhibits normal muscle tone. Coordination normal.  Skin: Skin is warm and dry. No rash noted. She is not diaphoretic. No erythema.  Psychiatric: Her mood appears not anxious. Her affect is not angry, not blunt, not labile and not inappropriate. Her speech is not rapid and/or pressured, not delayed, not tangential and not slurred. She is not agitated, not aggressive,  not hyperactive, not slowed, not withdrawn, not actively hallucinating and not combative. Thought content is not paranoid and not delusional. Cognition and memory are impaired. She does not express impulsivity or inappropriate judgment. She exhibits a depressed mood. She is communicative. She exhibits abnormal recent memory. She exhibits normal remote memory.    Filed Vitals:   06/21/13 1412  BP: 134/68  Pulse: 74  Temp: 98 F (36.7 C)  TempSrc: Tympanic  Resp: 18      Labs reviewed: Basic Metabolic Panel:  Recent Labs  21/30/86 10/30/12 1923 11/03/12 06/15/13  NA 141 140 141 136*  K 3.7 3.6 3.6 3.6  CL  --  106  --   --   GLUCOSE  --  129*  --   --   BUN 24* 23 21 19   CREATININE 1.1 1.40* 1.2* 1.5*  TSH 4.51  --  5.82 0.44   Liver Function Tests:  Recent Labs  10/27/12 11/03/12 06/15/13  AST 25 20 54*  ALT 9 9 17   ALKPHOS 58 51 60   CBC:  Recent Labs  10/30/12 1915 10/30/12 1923 11/03/12 06/15/13  WBC 10.1  --  7.3 12.5  HGB 13.0 13.9 11.2* 11.5*  HCT 38.6 41.0 34* 34*  MCV 85.8  --   --   --   PLT 256  --  213 224   Past Procedures:  06/08/13 CXR mild cardiomegaly unchanged without pulmonary vascular congestion, no pleural effusion, old granulomatous disease, patchy bibasilar atelectasis or  interstitial pnemonitis seen on the prior examination has nearly completely resolved in the interval, no inflammatory consolidate or suspicious nodule.   Assessment/Plan Pneumonitis Congestive cough, X-ray 06/08/13 patchy bibasilar atelectasis or interstitial pneumonitis-fully treated with 10 day course of Avelox, Deb q6h and Mucinex bid for 5 days, and Medrol dose pk 06/08/13. Only occasional hacking cough noted today.   Unspecified constipation Stable on MiraLax daily.         Depression Stable. Continue Celexa 40mg  and Remeron 7.5mg  at night.             Dementia Stable on Aricept, currently the patient is functioning well at AL level.               Edema Trace in the left ankle/foot--echocardiogram 11/03/12 showed EF 65%--monitor.       HTN (hypertension) Controlled and continue Amlodipine 10mg  daily          Hypothyroidism  Continue Levothyroxine daily since5/15/14 due to TSH 5.820 11/03/12            Acute bronchitis Resolved fever,  congestive cough, and expiratory wheezes,  CXR 06/08/13 bibasilar pneumonitis. Completed DuoNeb q6hr for 5 days then prn for 2 weeks. Mucinex 600mg  bid for 5 days. Avelox 400mg  po daily for 10 days. Medrol dose pack. Healing nicely. Occasional hacking cough noted.     Posterior neck pain New for 2 days--nape muscle targeted point pain palpated, reproduced with ROM of neck, no focal neurological deficit or AMS, will have Norco prn available to her. Warm compress 63min/each up to 4xday. May consider X-ray of Cervical spine. ESR and RAF if no better.     Family/ Staff Communication: observe the patient.   Goals of Care: AL  Labs/tests ordered: may consider RAF, ESR and neck X-ray

## 2013-06-21 NOTE — Assessment & Plan Note (Signed)
Resolved fever,  congestive cough, and expiratory wheezes,  CXR 06/08/13 bibasilar pneumonitis. Completed DuoNeb q6hr for 5 days then prn for 2 weeks. Mucinex 600mg  bid for 5 days. Avelox 400mg  po daily for 10 days. Medrol dose pack. Healing nicely. Occasional hacking cough noted.

## 2013-06-21 NOTE — Assessment & Plan Note (Signed)
Trace in the left ankle/foot--echocardiogram 11/03/12 showed EF 65%--monitor.    

## 2013-06-21 NOTE — Assessment & Plan Note (Signed)
New for 2 days--nape muscle targeted point pain palpated, reproduced with ROM of neck, no focal neurological deficit or AMS, will have Norco prn available to her. Warm compress 70min/each up to 4xday. May consider X-ray of Cervical spine. ESR and RAF if no better.

## 2013-08-31 ENCOUNTER — Encounter: Payer: Self-pay | Admitting: Internal Medicine

## 2013-08-31 ENCOUNTER — Non-Acute Institutional Stay: Payer: Medicare Other | Admitting: Internal Medicine

## 2013-08-31 VITALS — BP 112/60 | HR 72 | Ht 63.0 in | Wt 138.0 lb

## 2013-08-31 DIAGNOSIS — F3289 Other specified depressive episodes: Secondary | ICD-10-CM

## 2013-08-31 DIAGNOSIS — F0390 Unspecified dementia without behavioral disturbance: Secondary | ICD-10-CM

## 2013-08-31 DIAGNOSIS — L8990 Pressure ulcer of unspecified site, unspecified stage: Secondary | ICD-10-CM

## 2013-08-31 DIAGNOSIS — F329 Major depressive disorder, single episode, unspecified: Secondary | ICD-10-CM

## 2013-08-31 DIAGNOSIS — I10 Essential (primary) hypertension: Secondary | ICD-10-CM

## 2013-08-31 DIAGNOSIS — E039 Hypothyroidism, unspecified: Secondary | ICD-10-CM

## 2013-08-31 DIAGNOSIS — M542 Cervicalgia: Secondary | ICD-10-CM

## 2013-08-31 DIAGNOSIS — F32A Depression, unspecified: Secondary | ICD-10-CM

## 2013-08-31 DIAGNOSIS — R609 Edema, unspecified: Secondary | ICD-10-CM

## 2013-08-31 NOTE — Progress Notes (Signed)
Patient ID: Jasmin Rodriguez, female   DOB: 16-Aug-1913, 80 y.o.   MRN: [id number]    Location:  [facility]   Place of Service: Clinic (12)    Allergies  Allergen Reactions  . Tramadol   . Zoloft [Sertraline Hcl]     Chief Complaint  Patient presents with  . Medical Managment of Chronic Issues    blood pressure, thyroid, dementia, depression    HPI:  HTN (hypertension); controlled  Hypothyroidism: compensated  Dementia: mild  Edema: resolved  Posterior neck pain:resolved  Depression: denies and is able to joke during the exam. Mild anxiety about me being late getting into the exam room.  Pressure ulcer, unspecified site(707.00): resolved    Medications: Patient's Medications  New Prescriptions   No medications on file  Previous Medications   AMLODIPINE (NORVASC) 10 MG TABLET    Take 10 mg by mouth every morning.   CALCIUM CARB-CHOLECALCIFEROL (OYSTER SHELL CALCIUM + D PO)    Take by mouth. 500 mg-200 mg, 1 by mouth twice daily   CHOLECALCIFEROL (VITAMIN D) 1000 UNITS TABLET    Take 1,000 Units by mouth. 2 by mouth daily   CITALOPRAM (CELEXA) 40 MG TABLET    Take 40 mg by mouth every morning.    DONEPEZIL (ARICEPT) 10 MG TABLET    Take 10 mg by mouth at bedtime.   HYDROCODONE-ACETAMINOPHEN (NORCO/VICODIN) 5-325 MG PER TABLET    Take 1 tablet by mouth. 1 by mouth twice daily as needed for breakthrough pain   LEVOTHYROXINE (SYNTHROID, LEVOTHROID) 100 MCG TABLET    Take 100 mcg by mouth daily before breakfast. **Check pulse weekly on Monday**   MIRTAZAPINE (REMERON) 15 MG TABLET    Take 7.5 mg by mouth at bedtime.   MULTIPLE VITAMIN (THERA/BETA-CAROTENE) TABS    Take by mouth. 1 by mouth daily   POLYETHYLENE GLYCOL (MIRALAX / GLYCOLAX) PACKET    Take 17 g by mouth. Fill cap to 17 GM mark, mix with 4-8 ounces of fluid and take by mouth every day  Modified Medications   No medications on file  Discontinued Medications   No medications on file      Review of Systems  Constitutional: Negative for fever, chills, activity change, appetite change and unexpected weight change.  HENT: Positive for hearing loss. Negative for congestion, ear pain, rhinorrhea and sinus pressure.   Eyes: Negative.   Respiratory: Negative.   Cardiovascular: Negative for chest pain, palpitations and leg swelling.  Gastrointestinal: Negative for nausea, abdominal pain, diarrhea, constipation and abdominal distention.  Endocrine: Negative.   Genitourinary: Negative for urgency, frequency and flank pain.  Musculoskeletal: Positive for back pain. Negative for arthralgias, joint swelling, myalgias, neck pain and neck stiffness.  Skin: Negative for color change and pallor.  Allergic/Immunologic: Negative.   Neurological:       Forgetful  Hematological: Negative for adenopathy. Does not bruise/bleed easily.  Psychiatric/Behavioral: The patient is nervous/anxious (mild).     Filed Vitals:   08/31/13 1431  BP: 112/60  Pulse: 72  Height: 5\' 3"  (1.6 m)  Weight: 138 lb (62.596 kg)   Physical Exam  Constitutional: She is oriented to person, place, and time.  Frail. Elderly.  HENT:  Head: Normocephalic.  Right Ear: External ear normal.  Left Ear: External ear normal.  Mouth/Throat: No oropharyngeal exudate.  Eyes: Conjunctivae and EOM are normal. Pupils are equal, round, and reactive to light.  Neck: No JVD present. No tracheal deviation present. No thyromegaly present.  Cardiovascular: Normal rate, regular rhythm, normal heart sounds and intact distal pulses.  Exam reveals no gallop.   No murmur heard. Pulmonary/Chest: No respiratory distress. She has no wheezes. She has rales. She exhibits no tenderness.  Abdominal: She exhibits no distension and no mass. There is no tenderness.  Musculoskeletal: Normal range of motion. She exhibits no edema and no tenderness.  Unstable gait. Using walker.  Lymphadenopathy:    She has no cervical adenopathy.   Neurological: She is alert and oriented to person, place, and time. No cranial nerve deficit.  Skin: No rash noted. No erythema. No pallor.  Psychiatric: She has a normal mood and affect. Her behavior is normal. Judgment and thought content normal.     Labs reviewed: Nursing Home on 06/21/2013  Component Date Value Ref Range Status  . Hemoglobin 06/15/2013 11.5* 12.0 - 16.0 g/dL Final  . HCT 06/15/2013 34* 36 - 46 % Final  . Platelets 06/15/2013 224  150 - 399 K/L Final  . WBC 06/15/2013 12.5   Final  . Glucose 06/15/2013 93   Final  . BUN 06/15/2013 19  4 - 21 mg/dL Final  . Creatinine 06/15/2013 1.5* 0.5 - 1.1 mg/dL Final  . Potassium 06/15/2013 3.6  3.4 - 5.3 mmol/L Final  . Sodium 06/15/2013 136* 137 - 147 mmol/L Final  . Alkaline Phosphatase 06/15/2013 60  25 - 125 U/L Final  . ALT 06/15/2013 17  7 - 35 U/L Final  . AST 06/15/2013 54* 13 - 35 U/L Final  . Bilirubin, Total 06/15/2013 0.2   Final  . TSH 06/15/2013 0.44  0.41 - 5.90 uIU/mL Final      Assessment/Plan  1. HTN (hypertension) controlled  2. Hypothyroidism compensated  3. Dementia Mild and likely vascular  4. Edema resolved  5. Posterior neck pain iimproved  6. Depression Little evidence for this presently  7. Pressure ulcer, unspecified site(707.00) resolved

## 2013-09-26 LAB — CBC AND DIFFERENTIAL
HCT: 37 % (ref 36–46)
HEMOGLOBIN: 12.3 g/dL (ref 12.0–16.0)
Platelets: 254 10*3/uL (ref 150–399)
WBC: 7.7 10^3/mL

## 2013-09-26 LAB — BASIC METABOLIC PANEL
BUN: 19 mg/dL (ref 4–21)
CREATININE: 1.2 mg/dL — AB (ref 0.5–1.1)
Glucose: 86 mg/dL
POTASSIUM: 4 mmol/L (ref 3.4–5.3)
Sodium: 141 mmol/L (ref 137–147)

## 2013-09-26 LAB — HEPATIC FUNCTION PANEL
ALK PHOS: 63 U/L (ref 25–125)
ALT: 8 U/L (ref 7–35)
AST: 24 U/L (ref 13–35)
Bilirubin, Total: 0.5 mg/dL

## 2013-09-26 LAB — TSH: TSH: 2.68 u[IU]/mL (ref 0.41–5.90)

## 2013-10-04 ENCOUNTER — Non-Acute Institutional Stay (SKILLED_NURSING_FACILITY): Payer: Medicare Other | Admitting: Nurse Practitioner

## 2013-10-04 ENCOUNTER — Encounter: Payer: Self-pay | Admitting: Nurse Practitioner

## 2013-10-04 DIAGNOSIS — F3289 Other specified depressive episodes: Secondary | ICD-10-CM

## 2013-10-04 DIAGNOSIS — K5900 Constipation, unspecified: Secondary | ICD-10-CM

## 2013-10-04 DIAGNOSIS — F329 Major depressive disorder, single episode, unspecified: Secondary | ICD-10-CM

## 2013-10-04 DIAGNOSIS — E039 Hypothyroidism, unspecified: Secondary | ICD-10-CM

## 2013-10-04 DIAGNOSIS — F0390 Unspecified dementia without behavioral disturbance: Secondary | ICD-10-CM

## 2013-10-04 DIAGNOSIS — F32A Depression, unspecified: Secondary | ICD-10-CM

## 2013-10-04 DIAGNOSIS — R609 Edema, unspecified: Secondary | ICD-10-CM

## 2013-10-04 DIAGNOSIS — I10 Essential (primary) hypertension: Secondary | ICD-10-CM

## 2013-10-04 NOTE — Assessment & Plan Note (Signed)
Continue Levothyroxine 188mcg daily since5/15/14 due to TSH 2.685 09/26/13

## 2013-10-04 NOTE — Assessment & Plan Note (Signed)
Controlled and continue Amlodipine 10mg daily  

## 2013-10-04 NOTE — Assessment & Plan Note (Signed)
Stable on MiraLax daily.  

## 2013-10-04 NOTE — Assessment & Plan Note (Signed)
Stable. Continue Celexa 40mg  and Remeron 7.5mg  at night.

## 2013-10-04 NOTE — Assessment & Plan Note (Signed)
Takes Aricept, currently the patient is no longer functioning at [facility], admitted to SNF for care needs.

## 2013-10-04 NOTE — Assessment & Plan Note (Signed)
Trace in the left ankle/foot--echocardiogram 11/03/12 showed EF 65%--monitor.

## 2013-10-04 NOTE — Progress Notes (Signed)
Patient ID: Jasmin Rodriguez, female   DOB: [birth year], 80 y.o.   MRN: [id number]   Code Status: DNR  Allergies  Allergen Reactions  . Tramadol   . Zoloft [Sertraline Hcl]     Chief Complaint  Patient presents with  . Medical Managment of Chronic Issues    HPI: Patient is a 80 y.o. female seen in the SNF at [facility] today for evaluation of debilitating and other chronic medical conditions.  Problem List Items Addressed This Visit   Dementia - Primary     Takes Aricept, currently the patient is no longer functioning at [facility], admitted to SNF for care needs.      Depression     Stable. Continue Celexa 40mg  and Remeron 7.5mg  at night.       Edema     Trace in the left ankle/foot--echocardiogram 11/03/12 showed EF 65%--monitor.       HTN (hypertension)     Controlled and continue Amlodipine 10mg  daily    Hypothyroidism (Chronic)     Continue Levothyroxine 162mcg daily since5/15/14 due to TSH 2.685 09/26/13     Unspecified constipation     Stable on MiraLax daily.         Review of Systems:  Review of Systems  Constitutional: Negative for fever, chills, malaise/fatigue and diaphoresis.  HENT: Positive for hearing loss. Negative for congestion, ear pain and sore throat.   Eyes: Negative for pain, discharge and redness.  Respiratory: Negative for cough, sputum production, shortness of breath and wheezing.   Cardiovascular: Positive for PND. Negative for chest pain, palpitations, orthopnea, claudication and leg swelling.  Gastrointestinal: Negative for heartburn, nausea, vomiting, abdominal pain, diarrhea, constipation and blood in stool.  Genitourinary: Positive for frequency. Negative for dysuria, urgency, hematuria and flank pain.  Musculoskeletal: Positive for joint pain. Negative for back pain, falls, myalgias and neck pain.  Skin: Negative for itching and rash.  Neurological: Negative for dizziness, tingling, tremors, sensory change, speech change, focal  weakness, seizures, loss of consciousness, weakness and headaches.  Endo/Heme/Allergies: Negative for environmental allergies and polydipsia. Does not bruise/bleed easily.  Psychiatric/Behavioral: Positive for depression (flat affect) and memory loss. Negative for hallucinations. The patient is not nervous/anxious and does not have insomnia.      Past Medical History  Diagnosis Date  . Hypertension   . Anemia   . Thyroid disease   . Hyperlipidemia   . Osteopenia   . Radial fracture   . Femur fracture    Past Surgical History  Procedure Laterality Date  . Hip fracture surgery     Social History:   reports that she has never smoked. She has never used smokeless tobacco. She reports that she does not drink alcohol or use illicit drugs.  No family history on file.  Medications: Patient's Medications  New Prescriptions   No medications on file  Previous Medications   AMLODIPINE (NORVASC) 10 MG TABLET    Take 10 mg by mouth every morning.   CALCIUM CARB-CHOLECALCIFEROL (OYSTER SHELL CALCIUM + D PO)    Take by mouth. 500 mg-200 mg, 1 by mouth twice daily   CHOLECALCIFEROL (VITAMIN D) 1000 UNITS TABLET    Take 1,000 Units by mouth. 2 by mouth daily   CITALOPRAM (CELEXA) 40 MG TABLET    Take 40 mg by mouth every morning.    DONEPEZIL (ARICEPT) 10 MG TABLET    Take 10 mg by mouth at bedtime.   HYDROCODONE-ACETAMINOPHEN (NORCO/VICODIN) 5-325 MG PER TABLET  Take 1 tablet by mouth. 1 by mouth twice daily as needed for breakthrough pain   LEVOTHYROXINE (SYNTHROID, LEVOTHROID) 100 MCG TABLET    Take 100 mcg by mouth daily before breakfast. **Check pulse weekly on Monday**   MIRTAZAPINE (REMERON) 15 MG TABLET    Take 7.5 mg by mouth at bedtime.   MULTIPLE VITAMIN (THERA/BETA-CAROTENE) TABS    Take by mouth. 1 by mouth daily   POLYETHYLENE GLYCOL (MIRALAX / GLYCOLAX) PACKET    Take 17 g by mouth. Fill cap to 17 GM mark, mix with 4-8 ounces of fluid and take by mouth every day  Modified  Medications   No medications on file  Discontinued Medications   No medications on file     Physical Exam: Physical Exam  Constitutional: She is oriented to person, place, and time.  Frail. Elderly.  HENT:  Head: Normocephalic.  Right Ear: External ear normal.  Left Ear: External ear normal.  Mouth/Throat: No oropharyngeal exudate.  Eyes: Conjunctivae and EOM are normal. Pupils are equal, round, and reactive to light.  Neck: No JVD present. No tracheal deviation present. No thyromegaly present.  Cardiovascular: Normal rate, regular rhythm, normal heart sounds and intact distal pulses.  Exam reveals no gallop.   No murmur heard. Pulmonary/Chest: No respiratory distress. She has no wheezes. She has rales. She exhibits no tenderness.  Abdominal: She exhibits no distension and no mass. There is no tenderness.  Musculoskeletal: Normal range of motion. She exhibits no edema and no tenderness.  Unstable gait. Using walker.  Lymphadenopathy:    She has no cervical adenopathy.  Neurological: She is alert and oriented to person, place, and time. No cranial nerve deficit.  Skin: No rash noted. No erythema. No pallor.  Psychiatric: She has a normal mood and affect. Her speech is normal and behavior is normal. Judgment and thought content normal. Cognition and memory are impaired. She exhibits abnormal recent memory and abnormal remote memory.    Filed Vitals:   10/04/13 0954  BP: 138/56  Pulse: 62  Temp: 98.4 F (36.9 C)  TempSrc: Tympanic  Resp: 20      Labs reviewed: Basic Metabolic Panel:  Recent Labs  10/30/12 1923 11/03/12 06/15/13 09/26/13  NA 140 141 136* 141  K 3.6 3.6 3.6 4.0  CL 106  --   --   --   GLUCOSE 129*  --   --   --   BUN 23 21 19 19   CREATININE 1.40* 1.2* 1.5* 1.2*  TSH  --  5.82 0.44 2.68   Liver Function Tests:  Recent Labs  11/03/12 06/15/13 09/26/13  AST 20 54* 24  ALT 9 17 8   ALKPHOS 51 60 63   CBC:  Recent Labs  10/30/12 1915   11/03/12 06/15/13 09/26/13  WBC 10.1  --  7.3 12.5 7.7  HGB 13.0  < > 11.2* 11.5* 12.3  HCT 38.6  < > 34* 34* 37  MCV 85.8  --   --   --   --   PLT 256  --  213 224 254  < > = values in this interval not displayed.  Past Procedures:  06/08/13 CXR mild cardiomegaly unchanged without pulmonary vascular congestion, no pleural effusion, old granulomatous disease, patchy bibasilar atelectasis or interstitial pnemonitis seen on the prior examination has nearly completely resolved in the interval, no inflammatory consolidate or suspicious nodule.   Assessment/Plan Dementia Takes Aricept, currently the patient is no longer functioning at AL, admitted to SNF  for care needs.    Depression Stable. Continue Celexa 40mg  and Remeron 7.5mg  at night.     Edema Trace in the left ankle/foot--echocardiogram 11/03/12 showed EF 65%--monitor.     HTN (hypertension) Controlled and continue Amlodipine 10mg  daily  Hypothyroidism Continue Levothyroxine 133mcg daily since5/15/14 due to TSH 2.685 09/26/13   Unspecified constipation Stable on MiraLax daily.      Family/ Staff Communication: observe the patient  Goals of Care: SNF  Labs/tests ordered: none

## 2013-10-19 ENCOUNTER — Non-Acute Institutional Stay (SKILLED_NURSING_FACILITY): Payer: Medicare Other | Admitting: Internal Medicine

## 2013-10-19 DIAGNOSIS — R413 Other amnesia: Secondary | ICD-10-CM

## 2013-10-19 DIAGNOSIS — R269 Unspecified abnormalities of gait and mobility: Secondary | ICD-10-CM

## 2013-10-19 DIAGNOSIS — R2681 Unsteadiness on feet: Secondary | ICD-10-CM

## 2013-10-19 NOTE — Progress Notes (Deleted)
Patient ID: Jasmin Rodriguez, female   DOB: [birth year], 80 y.o.   MRN: [id number]    Location:  [facility]   Place of Service: SNF (31)  PCP: No primary provider on file.  Code Status: ***  Extended Emergency Contact Information Primary Emergency ContactElie Goody,  [id number] Home Phone: [phone] Relation: None  Allergies  Allergen Reactions  . Tramadol   . Zoloft [Sertraline Hcl]     Chief Complaint  Patient presents with  . Medical Management of Chronic Issues    Readmission to SNF by transfer from assisted living    HPI:  ***    Past Medical History  Diagnosis Date  . Hypertension   . Anemia   . Thyroid disease   . Hyperlipidemia   . Osteopenia   . Radial fracture   . Femur fracture     Past Surgical History  Procedure Laterality Date  . Hip fracture surgery      CONSULTANTS ***  PAST PROCEDURES ***  Social History: History   Social History  . Marital Status: Single    Spouse Name: N/A    Number of Children: N/A  . Years of Education: N/A   Social History Main Topics  . Smoking status: Never Smoker   . Smokeless tobacco: Never Used  . Alcohol Use: No  . Drug Use: No  . Sexual Activity: No   Other Topics Concern  . Not on file   Social History Narrative  . No narrative on file    Family History Family Status  Relation Status Death Age  . Mother Deceased   . Father Deceased    No family history on file.   Medications: Patient's Medications  New Prescriptions   No medications on file  Previous Medications   AMLODIPINE (NORVASC) 10 MG TABLET    Take 10 mg by mouth every morning.   CALCIUM CARB-CHOLECALCIFEROL (OYSTER SHELL CALCIUM + D PO)    Take by mouth. 500 mg-200 mg, 1 by mouth twice daily   CHOLECALCIFEROL (VITAMIN D) 1000 UNITS TABLET    Take 1,000 Units by mouth. 2 by mouth daily   CITALOPRAM (CELEXA) 40 MG TABLET    Take 40 mg by mouth every morning.    DONEPEZIL (ARICEPT)  10 MG TABLET    Take 10 mg by mouth at bedtime.   HYDROCODONE-ACETAMINOPHEN (NORCO/VICODIN) 5-325 MG PER TABLET    Take 1 tablet by mouth. 1 by mouth twice daily as needed for breakthrough pain   LEVOTHYROXINE (SYNTHROID, LEVOTHROID) 100 MCG TABLET    Take 100 mcg by mouth daily before breakfast. **Check pulse weekly on Monday**   MIRTAZAPINE (REMERON) 15 MG TABLET    Take 7.5 mg by mouth at bedtime.   MULTIPLE VITAMIN (THERA/BETA-CAROTENE) TABS    Take by mouth. 1 by mouth daily   POLYETHYLENE GLYCOL (MIRALAX / GLYCOLAX) PACKET    Take 17 g by mouth. Fill cap to 17 GM mark, mix with 4-8 ounces of fluid and take by mouth every day  Modified Medications   No medications on file  Discontinued Medications   No medications on file    Immunization History  Administered Date(s) Administered  . Influenza-Unspecified 05/04/2013  . Pneumococcal Polysaccharide-23 06/30/1995  . Td 06/29/2001     Review of Systems    Filed Vitals:   10/19/13 1537  BP: 138/56  Pulse: 62  Temp: 98.4 F (36.9 C)  Resp: 16  Height: 5\' 3"  (1.6 m)  Weight: 138 lb (62.596 kg)   Physical Exam     Labs reviewed: Nursing Home on 10/04/2013  Component Date Value Ref Range Status  . Hemoglobin 09/26/2013 12.3  12.0 - 16.0 g/dL Final  . HCT 09/26/2013 37  36 - 46 % Final  . Platelets 09/26/2013 254  150 - 399 K/L Final  . WBC 09/26/2013 7.7   Final  . Glucose 09/26/2013 86   Final  . BUN 09/26/2013 19  4 - 21 mg/dL Final  . Creatinine 09/26/2013 1.2* 0.5 - 1.1 mg/dL Final  . Potassium 09/26/2013 4.0  3.4 - 5.3 mmol/L Final  . Sodium 09/26/2013 141  137 - 147 mmol/L Final  . Alkaline Phosphatase 09/26/2013 63  25 - 125 U/L Final  . ALT 09/26/2013 8  7 - 35 U/L Final  . AST 09/26/2013 24  13 - 35 U/L Final  . Bilirubin, Total 09/26/2013 0.5   Final  . TSH 09/26/2013 2.68  0.41 - 5.90 uIU/mL Final     Assessment/Plan There are no diagnoses linked to this encounter.

## 2013-10-30 ENCOUNTER — Encounter: Payer: Self-pay | Admitting: Nurse Practitioner

## 2013-10-30 ENCOUNTER — Non-Acute Institutional Stay (SKILLED_NURSING_FACILITY): Payer: Medicare Other | Admitting: Nurse Practitioner

## 2013-10-30 DIAGNOSIS — F32A Depression, unspecified: Secondary | ICD-10-CM

## 2013-10-30 DIAGNOSIS — I10 Essential (primary) hypertension: Secondary | ICD-10-CM

## 2013-10-30 DIAGNOSIS — E039 Hypothyroidism, unspecified: Secondary | ICD-10-CM

## 2013-10-30 DIAGNOSIS — F329 Major depressive disorder, single episode, unspecified: Secondary | ICD-10-CM

## 2013-10-30 DIAGNOSIS — F0390 Unspecified dementia without behavioral disturbance: Secondary | ICD-10-CM

## 2013-10-30 DIAGNOSIS — F3289 Other specified depressive episodes: Secondary | ICD-10-CM

## 2013-10-30 DIAGNOSIS — K5900 Constipation, unspecified: Secondary | ICD-10-CM

## 2013-10-30 NOTE — Progress Notes (Signed)
Patient ID: Jasmin Rodriguez, female   DOB: [birth year], 81 y.o.   MRN: [id number]   Code Status: DNR  Allergies  Allergen Reactions  . Tramadol   . Zoloft [Sertraline Hcl]     Chief Complaint  Patient presents with  . Medical Management of Chronic Issues    HPI: Patient is a 80 y.o. female seen in the SNF at [facility] today for evaluation of chronic medical conditions.  Problem List Items Addressed This Visit   Hypothyroidism (Chronic)     Continue Levothyroxine 162mcg daily since5/15/14 due to TSH 2.685 09/26/13     HTN (hypertension) - Primary     Controlled and continue Amlodipine 10mg  daily     Dementia     Takes Aricept, requires SNF for care needs.       Depression     Stable. Continue Celexa 40mg  and Remeron 7.5mg  at night.       Unspecified constipation     Stable on MiraLax daily.          Review of Systems:  Review of Systems  Constitutional: Negative for fever, chills, malaise/fatigue and diaphoresis.  HENT: Positive for hearing loss. Negative for congestion, ear pain and sore throat.   Eyes: Negative for pain, discharge and redness.  Respiratory: Negative for cough, sputum production, shortness of breath and wheezing.   Cardiovascular: Positive for PND. Negative for chest pain, palpitations, orthopnea, claudication and leg swelling.  Gastrointestinal: Negative for heartburn, nausea, vomiting, abdominal pain, diarrhea, constipation and blood in stool.  Genitourinary: Positive for frequency. Negative for dysuria, urgency, hematuria and flank pain.  Musculoskeletal: Positive for joint pain. Negative for back pain, falls, myalgias and neck pain.  Skin: Negative for itching and rash.  Neurological: Negative for dizziness, tingling, tremors, sensory change, speech change, focal weakness, seizures, loss of consciousness, weakness and headaches.  Endo/Heme/Allergies: Negative for environmental allergies and polydipsia. Does not bruise/bleed  easily.  Psychiatric/Behavioral: Positive for depression (flat affect) and memory loss. Negative for hallucinations. The patient is not nervous/anxious and does not have insomnia.      Past Medical History  Diagnosis Date  . Hypertension   . Anemia   . Thyroid disease   . Hyperlipidemia   . Osteopenia   . Radial fracture   . Femur fracture    Past Surgical History  Procedure Laterality Date  . Hip fracture surgery     Social History:   reports that she has never smoked. She has never used smokeless tobacco. She reports that she does not drink alcohol or use illicit drugs.  No family history on file.  Medications: Patient's Medications  New Prescriptions   No medications on file  Previous Medications   AMLODIPINE (NORVASC) 10 MG TABLET    Take 10 mg by mouth every morning.   CALCIUM CARB-CHOLECALCIFEROL (OYSTER SHELL CALCIUM + D PO)    Take by mouth. 500 mg-200 mg, 1 by mouth twice daily   CHOLECALCIFEROL (VITAMIN D) 1000 UNITS TABLET    Take 1,000 Units by mouth. 2 by mouth daily   CITALOPRAM (CELEXA) 40 MG TABLET    Take 40 mg by mouth every morning.    DONEPEZIL (ARICEPT) 10 MG TABLET    Take 10 mg by mouth at bedtime.   LEVOTHYROXINE (SYNTHROID, LEVOTHROID) 100 MCG TABLET    Take 100 mcg by mouth daily before breakfast. **Check pulse weekly on Monday**   MIRTAZAPINE (REMERON) 15 MG TABLET    Take 7.5 mg by mouth at  bedtime.   MULTIPLE VITAMIN (THERA/BETA-CAROTENE) TABS    Take by mouth. 1 by mouth daily   POLYETHYLENE GLYCOL (MIRALAX / GLYCOLAX) PACKET    Take 17 g by mouth. Fill cap to 17 GM mark, mix with 4-8 ounces of fluid and take by mouth every day  Modified Medications   No medications on file  Discontinued Medications   HYDROCODONE-ACETAMINOPHEN (NORCO/VICODIN) 5-325 MG PER TABLET    Take 1 tablet by mouth. 1 by mouth twice daily as needed for breakthrough pain     Physical Exam: Physical Exam  Constitutional: She is oriented to person, place, and time.    Frail. Elderly.  HENT:  Head: Normocephalic.  Right Ear: External ear normal.  Left Ear: External ear normal.  Mouth/Throat: No oropharyngeal exudate.  Torus mandibularis.   Eyes: Conjunctivae and EOM are normal. Pupils are equal, round, and reactive to light.  Neck: No JVD present. No tracheal deviation present. No thyromegaly present.  Cardiovascular: Normal rate, regular rhythm, normal heart sounds and intact distal pulses.  Exam reveals no gallop.   No murmur heard. Pulmonary/Chest: No respiratory distress. She has no wheezes. She has rales. She exhibits no tenderness.  Abdominal: She exhibits no distension and no mass. There is no tenderness.  Musculoskeletal: Normal range of motion. She exhibits no edema and no tenderness.  Unstable gait. Using walker.  Lymphadenopathy:    She has no cervical adenopathy.  Neurological: She is alert and oriented to person, place, and time. No cranial nerve deficit.  Skin: No rash noted. No erythema. No pallor.  Psychiatric: She has a normal mood and affect. Her speech is normal and behavior is normal. Judgment and thought content normal. Cognition and memory are impaired. She exhibits abnormal recent memory and abnormal remote memory.    Filed Vitals:   10/30/13 1852  BP: 138/80  Pulse: 80  Temp: 98.4 F (36.9 C)  TempSrc: Tympanic  Resp: 18      Labs reviewed: Basic Metabolic Panel:  Recent Labs  11/03/12 06/15/13 09/26/13  NA 141 136* 141  K 3.6 3.6 4.0  BUN 21 19 19   CREATININE 1.2* 1.5* 1.2*  TSH 5.82 0.44 2.68   Liver Function Tests:  Recent Labs  11/03/12 06/15/13 09/26/13  AST 20 54* 24  ALT 9 17 8   ALKPHOS 51 60 63   CBC:  Recent Labs  11/03/12 06/15/13 09/26/13  WBC 7.3 12.5 7.7  HGB 11.2* 11.5* 12.3  HCT 34* 34* 37  PLT 213 224 254    Past Procedures:  06/08/13 CXR mild cardiomegaly unchanged without pulmonary vascular congestion, no pleural effusion, old granulomatous disease, patchy bibasilar  atelectasis or interstitial pnemonitis seen on the prior examination has nearly completely resolved in the interval, no inflammatory consolidate or suspicious nodule.   Assessment/Plan HTN (hypertension) Controlled and continue Amlodipine 10mg  daily   Depression Stable. Continue Celexa 40mg  and Remeron 7.5mg  at night.     Dementia Takes Aricept, requires SNF for care needs.     Hypothyroidism Continue Levothyroxine 154mcg daily since5/15/14 due to TSH 2.685 09/26/13   Unspecified constipation Stable on MiraLax daily.       Family/ Staff Communication: observe the patient  Goals of Care: SNF  Labs/tests ordered: none

## 2013-10-30 NOTE — Assessment & Plan Note (Signed)
Takes Aricept, requires SNF for care needs.    

## 2013-10-30 NOTE — Assessment & Plan Note (Signed)
Continue Levothyroxine 100mcg daily since5/15/14 due to TSH 2.685 09/26/13  

## 2013-10-30 NOTE — Assessment & Plan Note (Signed)
Stable. Continue Celexa 40mg and Remeron 7.5mg at night.    

## 2013-10-30 NOTE — Assessment & Plan Note (Signed)
Stable on MiraLax daily.  

## 2013-10-30 NOTE — Assessment & Plan Note (Signed)
Controlled and continue Amlodipine 10mg  daily

## 2013-11-02 ENCOUNTER — Other Ambulatory Visit: Payer: Self-pay | Admitting: Nurse Practitioner

## 2013-11-08 ENCOUNTER — Other Ambulatory Visit: Payer: Self-pay | Admitting: Nurse Practitioner

## 2013-11-08 DIAGNOSIS — F32A Depression, unspecified: Secondary | ICD-10-CM

## 2013-11-08 DIAGNOSIS — F329 Major depressive disorder, single episode, unspecified: Secondary | ICD-10-CM

## 2013-11-08 MED ORDER — CITALOPRAM HYDROBROMIDE 40 MG PO TABS: 20.0000 mg | ORAL_TABLET | Freq: Every morning | ORAL | Status: AC

## 2013-11-27 ENCOUNTER — Encounter: Payer: Self-pay | Admitting: Nurse Practitioner

## 2013-11-27 ENCOUNTER — Non-Acute Institutional Stay (SKILLED_NURSING_FACILITY): Payer: Medicare Other | Admitting: Nurse Practitioner

## 2013-11-27 DIAGNOSIS — K5900 Constipation, unspecified: Secondary | ICD-10-CM

## 2013-11-27 DIAGNOSIS — I10 Essential (primary) hypertension: Secondary | ICD-10-CM

## 2013-11-27 DIAGNOSIS — E039 Hypothyroidism, unspecified: Secondary | ICD-10-CM

## 2013-11-27 DIAGNOSIS — F329 Major depressive disorder, single episode, unspecified: Secondary | ICD-10-CM

## 2013-11-27 DIAGNOSIS — F0390 Unspecified dementia without behavioral disturbance: Secondary | ICD-10-CM

## 2013-11-27 DIAGNOSIS — F3289 Other specified depressive episodes: Secondary | ICD-10-CM

## 2013-11-27 DIAGNOSIS — F32A Depression, unspecified: Secondary | ICD-10-CM

## 2013-11-27 NOTE — Assessment & Plan Note (Signed)
Continue Levothyroxine 100mcg. TSH 2.685 09/26/13  

## 2013-11-27 NOTE — Assessment & Plan Note (Signed)
Stable. Able to reduce Celexa to 20mg  and Remeron 7.5mg  at night.

## 2013-11-27 NOTE — Progress Notes (Signed)
Patient ID: Jasmin Rodriguez, female   DOB: December 04, 1913, 80 y.o.   MRN: [id number]   Code Status: DNR  Allergies  Allergen Reactions  . Tramadol   . Zoloft [Sertraline Hcl]     Chief Complaint  Patient presents with  . Medical Management of Chronic Issues    HPI: Patient is a 80 y.o. female seen in the SNF at [facility] today for evaluation of chronic medical conditions.  Problem List Items Addressed This Visit   Dementia     Takes Aricept, requires SNF for care needs.        Depression     Stable. Able to reduce Celexa to 20mg  and Remeron 7.5mg  at night.        HTN (hypertension) - Primary     Controlled and continue Amlodipine 10mg  daily      Hypothyroidism (Chronic)     Continue Levothyroxine 125mcg. TSH 2.685 09/26/13      Unspecified constipation     Stable on MiraLax daily.          Review of Systems:  Review of Systems  Constitutional: Negative for fever, chills, malaise/fatigue and diaphoresis.  HENT: Positive for hearing loss. Negative for congestion, ear pain and sore throat.   Eyes: Negative for pain, discharge and redness.  Respiratory: Negative for cough, sputum production, shortness of breath and wheezing.   Cardiovascular: Positive for PND. Negative for chest pain, palpitations, orthopnea, claudication and leg swelling.  Gastrointestinal: Negative for heartburn, nausea, vomiting, abdominal pain, diarrhea, constipation and blood in stool.  Genitourinary: Positive for frequency. Negative for dysuria, urgency, hematuria and flank pain.  Musculoskeletal: Positive for joint pain. Negative for back pain, falls, myalgias and neck pain.  Skin: Negative for itching and rash.  Neurological: Negative for dizziness, tingling, tremors, sensory change, speech change, focal weakness, seizures, loss of consciousness, weakness and headaches.  Endo/Heme/Allergies: Negative for environmental allergies and polydipsia. Does not bruise/bleed easily.    Psychiatric/Behavioral: Positive for depression (flat affect) and memory loss. Negative for hallucinations. The patient is not nervous/anxious and does not have insomnia.      Past Medical History  Diagnosis Date  . Hypertension   . Anemia   . Thyroid disease   . Hyperlipidemia   . Osteopenia   . Radial fracture   . Femur fracture    Past Surgical History  Procedure Laterality Date  . Hip fracture surgery     Social History:   reports that she has never smoked. She has never used smokeless tobacco. She reports that she does not drink alcohol or use illicit drugs.  No family history on file.  Medications: Patient's Medications  New Prescriptions   No medications on file  Previous Medications   AMLODIPINE (NORVASC) 10 MG TABLET    Take 10 mg by mouth every morning.   CALCIUM CARB-CHOLECALCIFEROL (OYSTER SHELL CALCIUM + D PO)    Take by mouth. 500 mg-200 mg, 1 by mouth twice daily   CHOLECALCIFEROL (VITAMIN D) 1000 UNITS TABLET    Take 1,000 Units by mouth. 2 by mouth daily   CITALOPRAM (CELEXA) 40 MG TABLET    Take 0.5 tablets (20 mg total) by mouth every morning.   DONEPEZIL (ARICEPT) 10 MG TABLET    Take 10 mg by mouth at bedtime.   LEVOTHYROXINE (SYNTHROID, LEVOTHROID) 100 MCG TABLET    Take 100 mcg by mouth daily before breakfast. **Check pulse weekly on Monday**   MIRTAZAPINE (REMERON) 15 MG TABLET  Take 7.5 mg by mouth at bedtime.   MULTIPLE VITAMIN (THERA/BETA-CAROTENE) TABS    Take by mouth. 1 by mouth daily   POLYETHYLENE GLYCOL (MIRALAX / GLYCOLAX) PACKET    Take 17 g by mouth. Fill cap to 17 GM mark, mix with 4-8 ounces of fluid and take by mouth every day  Modified Medications   No medications on file  Discontinued Medications   No medications on file     Physical Exam: Physical Exam  Constitutional: She is oriented to person, place, and time.  Frail. Elderly.  HENT:  Head: Normocephalic.  Right Ear: External ear normal.  Left Ear: External ear normal.   Mouth/Throat: No oropharyngeal exudate.  Torus mandibularis.   Eyes: Conjunctivae and EOM are normal. Pupils are equal, round, and reactive to light.  Neck: No JVD present. No tracheal deviation present. No thyromegaly present.  Cardiovascular: Normal rate, regular rhythm, normal heart sounds and intact distal pulses.  Exam reveals no gallop.   No murmur heard. Pulmonary/Chest: No respiratory distress. She has no wheezes. She has rales. She exhibits no tenderness.  Abdominal: She exhibits no distension and no mass. There is no tenderness.  Musculoskeletal: Normal range of motion. She exhibits no edema and no tenderness.  Unstable gait. Using walker.  Lymphadenopathy:    She has no cervical adenopathy.  Neurological: She is alert and oriented to person, place, and time. No cranial nerve deficit.  Skin: No rash noted. No erythema. No pallor.  Psychiatric: She has a normal mood and affect. Her speech is normal and behavior is normal. Judgment and thought content normal. Cognition and memory are impaired. She exhibits abnormal recent memory and abnormal remote memory.    Filed Vitals:   11/27/13 1506  BP: 122/74  Pulse: 68  Temp: 98.2 F (36.8 C)  TempSrc: Tympanic  Resp: 16      Labs reviewed: Basic Metabolic Panel:  Recent Labs  06/15/13 09/26/13  NA 136* 141  K 3.6 4.0  BUN 19 19  CREATININE 1.5* 1.2*  TSH 0.44 2.68   Liver Function Tests:  Recent Labs  06/15/13 09/26/13  AST 54* 24  ALT 17 8  ALKPHOS 60 63   CBC:  Recent Labs  06/15/13 09/26/13  WBC 12.5 7.7  HGB 11.5* 12.3  HCT 34* 37  PLT 224 254    Past Procedures:  06/08/13 CXR mild cardiomegaly unchanged without pulmonary vascular congestion, no pleural effusion, old granulomatous disease, patchy bibasilar atelectasis or interstitial pnemonitis seen on the prior examination has nearly completely resolved in the interval, no inflammatory consolidate or suspicious nodule.   Assessment/Plan HTN  (hypertension) Controlled and continue Amlodipine 10mg  daily    Depression Stable. Able to reduce Celexa to 20mg  and Remeron 7.5mg  at night.      Hypothyroidism Continue Levothyroxine 174mcg. TSH 2.685 09/26/13    Unspecified constipation Stable on MiraLax daily.     Dementia Takes Aricept, requires SNF for care needs.        Family/ Staff Communication: observe the patient  Goals of Care: SNF  Labs/tests ordered: none

## 2013-11-27 NOTE — Assessment & Plan Note (Signed)
Takes Aricept, requires SNF for care needs.    

## 2013-11-27 NOTE — Assessment & Plan Note (Signed)
Stable on MiraLax daily.  

## 2013-11-27 NOTE — Assessment & Plan Note (Signed)
Controlled and continue Amlodipine 10mg daily  

## 2013-12-27 ENCOUNTER — Encounter: Payer: Self-pay | Admitting: Nurse Practitioner

## 2013-12-27 ENCOUNTER — Non-Acute Institutional Stay (SKILLED_NURSING_FACILITY): Payer: Medicare Other | Admitting: Nurse Practitioner

## 2013-12-27 DIAGNOSIS — I10 Essential (primary) hypertension: Secondary | ICD-10-CM

## 2013-12-27 DIAGNOSIS — F3289 Other specified depressive episodes: Secondary | ICD-10-CM

## 2013-12-27 DIAGNOSIS — F0391 Unspecified dementia with behavioral disturbance: Secondary | ICD-10-CM

## 2013-12-27 DIAGNOSIS — F32A Depression, unspecified: Secondary | ICD-10-CM

## 2013-12-27 DIAGNOSIS — K5900 Constipation, unspecified: Secondary | ICD-10-CM

## 2013-12-27 DIAGNOSIS — F03918 Unspecified dementia, unspecified severity, with other behavioral disturbance: Secondary | ICD-10-CM

## 2013-12-27 DIAGNOSIS — F329 Major depressive disorder, single episode, unspecified: Secondary | ICD-10-CM

## 2013-12-27 DIAGNOSIS — E039 Hypothyroidism, unspecified: Secondary | ICD-10-CM

## 2013-12-27 NOTE — Assessment & Plan Note (Signed)
Takes Aricept, requires SNF for care needs.    

## 2013-12-27 NOTE — Progress Notes (Signed)
Patient ID: Jasmin Rodriguez, female   DOB: 1913/10/06, 80 y.o.   MRN: [id number]   Code Status: DNR  Allergies  Allergen Reactions  . Tramadol   . Zoloft [Sertraline Hcl]     Chief Complaint  Patient presents with  . Medical Management of Chronic Issues    HPI: Patient is a 80 y.o. female seen in the SNF at [facility] today for evaluation of chronic medical conditions.  Problem List Items Addressed This Visit   Hypothyroidism (Chronic)     Continue Levothyroxine 161mcg. TSH 2.685 09/26/13       HTN (hypertension) - Primary     Controlled and takes Amlodipine 10mg  daily      Dementia     Takes Aricept, requires SNF for care needs.       Depression     Stable. Takes Celexa to 20mg  and Remeron 7.5mg  at night.       Unspecified constipation     Stable on MiraLax daily.        Review of Systems:  Review of Systems  Constitutional: Negative for fever, chills, malaise/fatigue and diaphoresis.  HENT: Positive for hearing loss. Negative for congestion, ear pain and sore throat.   Eyes: Negative for pain, discharge and redness.  Respiratory: Negative for cough, sputum production, shortness of breath and wheezing.   Cardiovascular: Positive for PND. Negative for chest pain, palpitations, orthopnea, claudication and leg swelling.  Gastrointestinal: Negative for heartburn, nausea, vomiting, abdominal pain, diarrhea, constipation and blood in stool.  Genitourinary: Positive for frequency. Negative for dysuria, urgency, hematuria and flank pain.  Musculoskeletal: Positive for joint pain. Negative for back pain, falls, myalgias and neck pain.  Skin: Negative for itching and rash.  Neurological: Negative for dizziness, tingling, tremors, sensory change, speech change, focal weakness, seizures, loss of consciousness, weakness and headaches.  Endo/Heme/Allergies: Negative for environmental allergies and polydipsia. Does not bruise/bleed easily.    Psychiatric/Behavioral: Positive for depression (flat affect) and memory loss. Negative for hallucinations. The patient is not nervous/anxious and does not have insomnia.      Past Medical History  Diagnosis Date  . Hypertension   . Anemia   . Thyroid disease   . Hyperlipidemia   . Osteopenia   . Radial fracture   . Femur fracture    Past Surgical History  Procedure Laterality Date  . Hip fracture surgery     Social History:   reports that she has never smoked. She has never used smokeless tobacco. She reports that she does not drink alcohol or use illicit drugs.  No family history on file.  Medications: Patient's Medications  New Prescriptions   No medications on file  Previous Medications   AMLODIPINE (NORVASC) 10 MG TABLET    Take 10 mg by mouth every morning.   CALCIUM CARB-CHOLECALCIFEROL (OYSTER SHELL CALCIUM + D PO)    Take by mouth. 500 mg-200 mg, 1 by mouth twice daily   CHOLECALCIFEROL (VITAMIN D) 1000 UNITS TABLET    Take 1,000 Units by mouth. 2 by mouth daily   CITALOPRAM (CELEXA) 40 MG TABLET    Take 0.5 tablets (20 mg total) by mouth every morning.   DONEPEZIL (ARICEPT) 10 MG TABLET    Take 10 mg by mouth at bedtime.   LEVOTHYROXINE (SYNTHROID, LEVOTHROID) 100 MCG TABLET    Take 100 mcg by mouth daily before breakfast. **Check pulse weekly on Monday**   MIRTAZAPINE (REMERON) 15 MG TABLET    Take 7.5 mg by mouth  at bedtime.   MULTIPLE VITAMIN (THERA/BETA-CAROTENE) TABS    Take by mouth. 1 by mouth daily   POLYETHYLENE GLYCOL (MIRALAX / GLYCOLAX) PACKET    Take 17 g by mouth. Fill cap to 17 GM mark, mix with 4-8 ounces of fluid and take by mouth every day  Modified Medications   No medications on file  Discontinued Medications   No medications on file     Physical Exam: Physical Exam  Constitutional: She is oriented to person, place, and time.  Frail. Elderly.  HENT:  Head: Normocephalic.  Right Ear: External ear normal.  Left Ear: External ear normal.   Mouth/Throat: No oropharyngeal exudate.  Torus mandibularis.   Eyes: Conjunctivae and EOM are normal. Pupils are equal, round, and reactive to light.  Neck: No JVD present. No tracheal deviation present. No thyromegaly present.  Cardiovascular: Normal rate, regular rhythm, normal heart sounds and intact distal pulses.  Exam reveals no gallop.   No murmur heard. Pulmonary/Chest: No respiratory distress. She has no wheezes. She has rales. She exhibits no tenderness.  Abdominal: She exhibits no distension and no mass. There is no tenderness.  Musculoskeletal: Normal range of motion. She exhibits no edema and no tenderness.  Unstable gait. Using walker.  Lymphadenopathy:    She has no cervical adenopathy.  Neurological: She is alert and oriented to person, place, and time. No cranial nerve deficit.  Skin: No rash noted. No erythema. No pallor.  Psychiatric: She has a normal mood and affect. Her speech is normal and behavior is normal. Judgment and thought content normal. Cognition and memory are impaired. She exhibits abnormal recent memory and abnormal remote memory.    Filed Vitals:   12/27/13 1658  BP: 120/70  Pulse: 68  Temp: 97.8 F (36.6 C)  TempSrc: Tympanic  Resp: 16      Labs reviewed: Basic Metabolic Panel:  Recent Labs  06/15/13 09/26/13  NA 136* 141  K 3.6 4.0  BUN 19 19  CREATININE 1.5* 1.2*  TSH 0.44 2.68   Liver Function Tests:  Recent Labs  06/15/13 09/26/13  AST 54* 24  ALT 17 8  ALKPHOS 60 63   CBC:  Recent Labs  06/15/13 09/26/13  WBC 12.5 7.7  HGB 11.5* 12.3  HCT 34* 37  PLT 224 254    Past Procedures:  06/08/13 CXR mild cardiomegaly unchanged without pulmonary vascular congestion, no pleural effusion, old granulomatous disease, patchy bibasilar atelectasis or interstitial pnemonitis seen on the prior examination has nearly completely resolved in the interval, no inflammatory consolidate or suspicious nodule.   Assessment/Plan HTN  (hypertension) Controlled and takes Amlodipine 10mg  daily    Depression Stable. Takes Celexa to 20mg  and Remeron 7.5mg  at night.     Hypothyroidism Continue Levothyroxine 169mcg. TSH 2.685 09/26/13     Unspecified constipation Stable on MiraLax daily.   Dementia Takes Aricept, requires SNF for care needs.       Family/ Staff Communication: observe the patient  Goals of Care: SNF  Labs/tests ordered: none

## 2013-12-27 NOTE — Assessment & Plan Note (Signed)
Stable. Takes Celexa to 20mg  and Remeron 7.5mg  at night.

## 2013-12-27 NOTE — Assessment & Plan Note (Signed)
Controlled and takes Amlodipine 10mg daily   

## 2013-12-27 NOTE — Assessment & Plan Note (Signed)
Continue Levothyroxine 100mcg. TSH 2.685 09/26/13  

## 2013-12-27 NOTE — Assessment & Plan Note (Signed)
Stable on MiraLax daily.

## 2014-01-18 ENCOUNTER — Encounter: Payer: Self-pay | Admitting: Internal Medicine

## 2014-02-01 ENCOUNTER — Non-Acute Institutional Stay (SKILLED_NURSING_FACILITY): Payer: Medicare Other | Admitting: Nurse Practitioner

## 2014-02-01 ENCOUNTER — Encounter: Payer: Self-pay | Admitting: Nurse Practitioner

## 2014-02-01 DIAGNOSIS — E039 Hypothyroidism, unspecified: Secondary | ICD-10-CM

## 2014-02-01 DIAGNOSIS — F0390 Unspecified dementia without behavioral disturbance: Secondary | ICD-10-CM

## 2014-02-01 DIAGNOSIS — F32A Depression, unspecified: Secondary | ICD-10-CM

## 2014-02-01 DIAGNOSIS — I10 Essential (primary) hypertension: Secondary | ICD-10-CM

## 2014-02-01 DIAGNOSIS — F329 Major depressive disorder, single episode, unspecified: Secondary | ICD-10-CM

## 2014-02-01 DIAGNOSIS — K5900 Constipation, unspecified: Secondary | ICD-10-CM

## 2014-02-01 DIAGNOSIS — F3289 Other specified depressive episodes: Secondary | ICD-10-CM

## 2014-02-01 NOTE — Progress Notes (Signed)
Patient ID: Jasmin Rodriguez, female   DOB: January 11, 1914, 80 y.o.   MRN: [id number]   Code Status: DNR  Allergies  Allergen Reactions  . Tramadol   . Zoloft [Sertraline Hcl]     Chief Complaint  Patient presents with  . Medical Management of Chronic Issues    HPI: Patient is a 80 y.o. female seen in the SNF at [facility] today for evaluation of chronic medical conditions.  Problem List Items Addressed This Visit   Hypothyroidism (Chronic)     Continue Levothyroxine 174mcg. TSH 2.685 09/26/13      HTN (hypertension) - Primary     Controlled and takes Amlodipine 10mg  daily     Dementia     Takes Aricept, requires SNF for care needs.       Depression     Stable. Takes Celexa to 20mg  and Remeron 7.5mg  at night.        Unspecified constipation     Stable on MiraLax daily.         Review of Systems:  Review of Systems  Constitutional: Negative for fever, chills, malaise/fatigue and diaphoresis.  HENT: Positive for hearing loss. Negative for congestion, ear pain and sore throat.   Eyes: Negative for pain, discharge and redness.  Respiratory: Negative for cough, sputum production, shortness of breath and wheezing.   Cardiovascular: Positive for PND. Negative for chest pain, palpitations, orthopnea, claudication and leg swelling.  Gastrointestinal: Negative for heartburn, nausea, vomiting, abdominal pain, diarrhea, constipation and blood in stool.  Genitourinary: Positive for frequency. Negative for dysuria, urgency, hematuria and flank pain.  Musculoskeletal: Positive for joint pain. Negative for back pain, falls, myalgias and neck pain.  Skin: Negative for itching and rash.  Neurological: Negative for dizziness, tingling, tremors, sensory change, speech change, focal weakness, seizures, loss of consciousness, weakness and headaches.  Endo/Heme/Allergies: Negative for environmental allergies and polydipsia. Does not bruise/bleed easily.    Psychiatric/Behavioral: Positive for depression (flat affect) and memory loss. Negative for hallucinations. The patient is not nervous/anxious and does not have insomnia.      Past Medical History  Diagnosis Date  . Hypertension   . Anemia   . Thyroid disease   . Hyperlipidemia   . Osteopenia   . Radial fracture   . Femur fracture    Past Surgical History  Procedure Laterality Date  . Hip fracture surgery     Social History:   reports that she has never smoked. She has never used smokeless tobacco. She reports that she does not drink alcohol or use illicit drugs.  No family history on file.  Medications: Patient's Medications  New Prescriptions   No medications on file  Previous Medications   AMLODIPINE (NORVASC) 10 MG TABLET    Take 10 mg by mouth every morning.   CALCIUM CARB-CHOLECALCIFEROL (OYSTER SHELL CALCIUM + D PO)    Take by mouth. 500 mg-200 mg, 1 by mouth twice daily   CHOLECALCIFEROL (VITAMIN D) 1000 UNITS TABLET    Take 1,000 Units by mouth. 2 by mouth daily   CITALOPRAM (CELEXA) 40 MG TABLET    Take 0.5 tablets (20 mg total) by mouth every morning.   DONEPEZIL (ARICEPT) 10 MG TABLET    Take 10 mg by mouth at bedtime.   LEVOTHYROXINE (SYNTHROID, LEVOTHROID) 100 MCG TABLET    Take 100 mcg by mouth daily before breakfast. **Check pulse weekly on Monday**   MIRTAZAPINE (REMERON) 15 MG TABLET    Take 7.5 mg by mouth  at bedtime.   MULTIPLE VITAMIN (THERA/BETA-CAROTENE) TABS    Take by mouth. 1 by mouth daily   POLYETHYLENE GLYCOL (MIRALAX / GLYCOLAX) PACKET    Take 17 g by mouth. Fill cap to 17 GM mark, mix with 4-8 ounces of fluid and take by mouth every day  Modified Medications   No medications on file  Discontinued Medications   No medications on file     Physical Exam: Physical Exam  Constitutional: She is oriented to person, place, and time.  Frail. Elderly.  HENT:  Head: Normocephalic.  Right Ear: External ear normal.  Left Ear: External ear normal.   Mouth/Throat: No oropharyngeal exudate.  Torus mandibularis.   Eyes: Conjunctivae and EOM are normal. Pupils are equal, round, and reactive to light.  Neck: No JVD present. No tracheal deviation present. No thyromegaly present.  Cardiovascular: Normal rate, regular rhythm, normal heart sounds and intact distal pulses.  Exam reveals no gallop.   No murmur heard. Pulmonary/Chest: No respiratory distress. She has no wheezes. She has rales. She exhibits no tenderness.  Abdominal: She exhibits no distension and no mass. There is no tenderness.  Musculoskeletal: Normal range of motion. She exhibits no edema and no tenderness.  Unstable gait. Using walker.  Lymphadenopathy:    She has no cervical adenopathy.  Neurological: She is alert and oriented to person, place, and time. No cranial nerve deficit.  Skin: No rash noted. No erythema. No pallor.  Psychiatric: She has a normal mood and affect. Her speech is normal and behavior is normal. Judgment and thought content normal. Cognition and memory are impaired. She exhibits abnormal recent memory and abnormal remote memory.    Filed Vitals:   02/01/14 1648  BP: 130/76  Pulse: 78  Temp: 98 F (36.7 C)  TempSrc: Tympanic  Resp: 16      Labs reviewed: Basic Metabolic Panel:  Recent Labs  06/15/13 09/26/13  NA 136* 141  K 3.6 4.0  BUN 19 19  CREATININE 1.5* 1.2*  TSH 0.44 2.68   Liver Function Tests:  Recent Labs  06/15/13 09/26/13  AST 54* 24  ALT 17 8  ALKPHOS 60 63   CBC:  Recent Labs  06/15/13 09/26/13  WBC 12.5 7.7  HGB 11.5* 12.3  HCT 34* 37  PLT 224 254    Past Procedures:  06/08/13 CXR mild cardiomegaly unchanged without pulmonary vascular congestion, no pleural effusion, old granulomatous disease, patchy bibasilar atelectasis or interstitial pnemonitis seen on the prior examination has nearly completely resolved in the interval, no inflammatory consolidate or suspicious nodule.   Assessment/Plan HTN  (hypertension) Controlled and takes Amlodipine 10mg  daily   Depression Stable. Takes Celexa to 20mg  and Remeron 7.5mg  at night.      Hypothyroidism Continue Levothyroxine 166mcg. TSH 2.685 09/26/13    Unspecified constipation Stable on MiraLax daily.    Dementia Takes Aricept, requires SNF for care needs.       Family/ Staff Communication: observe the patient  Goals of Care: SNF  Labs/tests ordered: none

## 2014-02-01 NOTE — Assessment & Plan Note (Signed)
Takes Aricept, requires SNF for care needs.    

## 2014-02-01 NOTE — Assessment & Plan Note (Signed)
Continue Levothyroxine 100mcg. TSH 2.685 09/26/13  

## 2014-02-01 NOTE — Assessment & Plan Note (Signed)
Stable. Takes Celexa to 20mg  and Remeron 7.5mg  at night.

## 2014-02-01 NOTE — Assessment & Plan Note (Signed)
Stable on MiraLax daily.  

## 2014-02-01 NOTE — Assessment & Plan Note (Signed)
Controlled and takes Amlodipine 10mg daily   

## 2014-02-12 ENCOUNTER — Encounter: Payer: Self-pay | Admitting: Nurse Practitioner

## 2014-03-01 ENCOUNTER — Non-Acute Institutional Stay (SKILLED_NURSING_FACILITY): Payer: Medicare Other | Admitting: Nurse Practitioner

## 2014-03-01 DIAGNOSIS — I10 Essential (primary) hypertension: Secondary | ICD-10-CM

## 2014-03-01 DIAGNOSIS — K5900 Constipation, unspecified: Secondary | ICD-10-CM

## 2014-03-01 DIAGNOSIS — F3289 Other specified depressive episodes: Secondary | ICD-10-CM

## 2014-03-01 DIAGNOSIS — F0390 Unspecified dementia without behavioral disturbance: Secondary | ICD-10-CM

## 2014-03-01 DIAGNOSIS — F32A Depression, unspecified: Secondary | ICD-10-CM

## 2014-03-01 DIAGNOSIS — F329 Major depressive disorder, single episode, unspecified: Secondary | ICD-10-CM

## 2014-03-01 NOTE — Assessment & Plan Note (Signed)
Controlled and takes Amlodipine 10mg daily   

## 2014-03-01 NOTE — Progress Notes (Signed)
Patient ID: Jasmin Rodriguez, female   DOB: March 04, 1914, 80 y.o.   MRN: [id number]   Code Status: DNR  Allergies  Allergen Reactions  . Tramadol   . Zoloft [Sertraline Hcl]     Chief Complaint  Patient presents with  . Medical Management of Chronic Issues    HPI: Patient is a 80 y.o. female seen in the SNF at [facility] today for evaluation of chronic medical conditions.  Problem List Items Addressed This Visit   HTN (hypertension) - Primary     Controlled and takes Amlodipine 10mg  daily       Dementia     Takes Aricept, requires SNF for care needs.         Depression     Stable. Takes Celexa to 20mg  and Remeron 7.5mg  at night.          Unspecified constipation     Stable on MiraLax daily.           Review of Systems:  Review of Systems  Constitutional: Negative for fever, chills, malaise/fatigue and diaphoresis.  HENT: Positive for hearing loss. Negative for congestion, ear pain and sore throat.   Eyes: Negative for pain, discharge and redness.  Respiratory: Negative for cough, sputum production, shortness of breath and wheezing.   Cardiovascular: Positive for PND. Negative for chest pain, palpitations, orthopnea, claudication and leg swelling.  Gastrointestinal: Negative for heartburn, nausea, vomiting, abdominal pain, diarrhea, constipation and blood in stool.  Genitourinary: Positive for frequency. Negative for dysuria, urgency, hematuria and flank pain.  Musculoskeletal: Positive for joint pain. Negative for back pain, falls, myalgias and neck pain.  Skin: Negative for itching and rash.  Neurological: Negative for dizziness, tingling, tremors, sensory change, speech change, focal weakness, seizures, loss of consciousness, weakness and headaches.  Endo/Heme/Allergies: Negative for environmental allergies and polydipsia. Does not bruise/bleed easily.  Psychiatric/Behavioral: Positive for depression (flat affect) and memory loss. Negative for  hallucinations. The patient is not nervous/anxious and does not have insomnia.      Past Medical History  Diagnosis Date  . Hypertension   . Anemia   . Thyroid disease   . Hyperlipidemia   . Osteopenia   . Radial fracture   . Femur fracture    Past Surgical History  Procedure Laterality Date  . Hip fracture surgery     Social History:   reports that she has never smoked. She has never used smokeless tobacco. She reports that she does not drink alcohol or use illicit drugs.  No family history on file.  Medications: Patient's Medications  New Prescriptions   No medications on file  Previous Medications   AMLODIPINE (NORVASC) 10 MG TABLET    Take 10 mg by mouth every morning.   CALCIUM CARB-CHOLECALCIFEROL (OYSTER SHELL CALCIUM + D PO)    Take by mouth. 500 mg-200 mg, 1 by mouth twice daily   CHOLECALCIFEROL (VITAMIN D) 1000 UNITS TABLET    Take 1,000 Units by mouth. 2 by mouth daily   CITALOPRAM (CELEXA) 40 MG TABLET    Take 0.5 tablets (20 mg total) by mouth every morning.   DONEPEZIL (ARICEPT) 10 MG TABLET    Take 10 mg by mouth at bedtime.   LEVOTHYROXINE (SYNTHROID, LEVOTHROID) 100 MCG TABLET    Take 100 mcg by mouth daily before breakfast. **Check pulse weekly on Monday**   MIRTAZAPINE (REMERON) 15 MG TABLET    Take 7.5 mg by mouth at bedtime.   MULTIPLE VITAMIN (THERA/BETA-CAROTENE) TABS  Take by mouth. 1 by mouth daily   POLYETHYLENE GLYCOL (MIRALAX / GLYCOLAX) PACKET    Take 17 g by mouth. Fill cap to 17 GM mark, mix with 4-8 ounces of fluid and take by mouth every day  Modified Medications   No medications on file  Discontinued Medications   No medications on file     Physical Exam: Physical Exam  Constitutional: She is oriented to person, place, and time and well-developed, well-nourished, and in no distress.  HENT:  Head: Normocephalic and atraumatic.  Eyes: Conjunctivae and EOM are normal.  Neck: Normal range of motion. Neck supple.  Cardiovascular:  Normal rate, regular rhythm and normal heart sounds.   Pulmonary/Chest: Effort normal and breath sounds normal.  Abdominal: Soft. Bowel sounds are normal.  Musculoskeletal: Normal range of motion.  Neurological: She is alert and oriented to person, place, and time.  Skin: Skin is warm and dry. Abrasion noted.  Abrasion to bridge of nose, pt endorses scratching it.  Psychiatric: Mood and affect normal.    Filed Vitals:   01/30/14 1437 02/27/14 1438  BP:  130/60  Pulse:  66  Temp:  97.6 F (36.4 C)  Resp:  18  Weight: 137 lb (62.143 kg)       Labs reviewed: Basic Metabolic Panel:  Recent Labs  06/15/13 09/26/13  NA 136* 141  K 3.6 4.0  BUN 19 19  CREATININE 1.5* 1.2*  TSH 0.44 2.68   Liver Function Tests:  Recent Labs  06/15/13 09/26/13  AST 54* 24  ALT 17 8  ALKPHOS 60 63   No results found for this basename: LIPASE, AMYLASE,  in the last 8760 hours No results found for this basename: AMMONIA,  in the last 8760 hours CBC:  Recent Labs  06/15/13 09/26/13  WBC 12.5 7.7  HGB 11.5* 12.3  HCT 34* 37  PLT 224 254   Lipid Panel: No results found for this basename: CHOL, HDL, LDLCALC, TRIG, CHOLHDL, LDLDIRECT,  in the last 8760 hours  Past Procedures:   n/a  Assessment/Plan Dementia Takes Aricept, requires SNF for care needs.       Depression Stable. Takes Celexa to 20mg  and Remeron 7.5mg  at night.        HTN (hypertension) Controlled and takes Amlodipine 10mg  daily     Hypothyroidism Continue Levothyroxine 124mcg. TSH 2.685 09/26/13      Unspecified constipation Stable on MiraLax daily.       Family/ Staff Communication: observe the patient  Goals of Care: SNF  Labs/tests ordered: none

## 2014-03-01 NOTE — Assessment & Plan Note (Signed)
Takes Aricept, requires SNF for care needs.    

## 2014-03-01 NOTE — Assessment & Plan Note (Signed)
Stable. Takes Celexa to 20mg  and Remeron 7.5mg  at night.

## 2014-03-01 NOTE — Assessment & Plan Note (Signed)
Stable on MiraLax daily.  

## 2014-03-01 NOTE — Assessment & Plan Note (Signed)
Continue Levothyroxine 100mcg. TSH 2.685 09/26/13  

## 2014-03-22 ENCOUNTER — Encounter: Payer: Self-pay | Admitting: Nurse Practitioner

## 2014-04-12 ENCOUNTER — Non-Acute Institutional Stay (SKILLED_NURSING_FACILITY): Payer: Medicare Other | Admitting: Internal Medicine

## 2014-04-12 DIAGNOSIS — C44721 Squamous cell carcinoma of skin of unspecified lower limb, including hip: Secondary | ICD-10-CM | POA: Insufficient documentation

## 2014-04-12 DIAGNOSIS — C44729 Squamous cell carcinoma of skin of left lower limb, including hip: Secondary | ICD-10-CM

## 2014-04-12 HISTORY — DX: Squamous cell carcinoma of skin of unspecified lower limb, including hip: C44.721

## 2014-04-12 NOTE — Progress Notes (Signed)
Patient ID: Jasmin Rodriguez, female   DOB: April 16, 1914, 80 y.o.   MRN: [id number]    [place] Room Number: 2  Place of Service: SNF (31)   Allergies  Allergen Reactions  . Tramadol   . Zoloft [Sertraline Hcl]     Chief Complaint  Patient presents with  . Acute Visit    lesion on the left leg    HPI:  Nursing reported an enlarging lesion on the left leg for several months. Painless. No bleeding. Patient says it has been there a long time  Medications: Patient's Medications  New Prescriptions   No medications on file  Previous Medications   AMLODIPINE (NORVASC) 10 MG TABLET    Take 10 mg by mouth every morning.   CALCIUM CARB-CHOLECALCIFEROL (OYSTER SHELL CALCIUM + D PO)    Take by mouth. 500 mg-200 mg, 1 by mouth twice daily   CHOLECALCIFEROL (VITAMIN D) 1000 UNITS TABLET    Take 1,000 Units by mouth. 2 by mouth daily   CITALOPRAM (CELEXA) 40 MG TABLET    Take 0.5 tablets (20 mg total) by mouth every morning.   DONEPEZIL (ARICEPT) 10 MG TABLET    Take 10 mg by mouth at bedtime.   LEVOTHYROXINE (SYNTHROID, LEVOTHROID) 100 MCG TABLET    Take 100 mcg by mouth daily before breakfast. **Check pulse weekly on Monday**   MULTIPLE VITAMIN (THERA/BETA-CAROTENE) TABS    Take by mouth. 1 by mouth daily   POLYETHYLENE GLYCOL (MIRALAX / GLYCOLAX) PACKET    Take 17 g by mouth. Fill cap to 17 GM mark, mix with 4-8 ounces of fluid and take by mouth every day  Modified Medications   No medications on file  Discontinued Medications   MIRTAZAPINE (REMERON) 15 MG TABLET    Take 7.5 mg by mouth at bedtime.     Review of Systems  Constitutional: Negative for fever, chills, activity change, appetite change and unexpected weight change.  HENT: Positive for hearing loss. Negative for congestion, ear pain, rhinorrhea and sinus pressure.   Eyes: Negative.   Respiratory: Negative.   Cardiovascular: Negative for chest pain, palpitations and leg swelling.    Gastrointestinal: Negative for nausea, abdominal pain, diarrhea, constipation and abdominal distention.  Endocrine: Negative.   Genitourinary: Negative for urgency, frequency and flank pain.  Musculoskeletal: Positive for back pain. Negative for arthralgias, joint swelling, myalgias, neck pain and neck stiffness.  Skin: Negative for color change and pallor.       Enlarging lesion of the left leg  Allergic/Immunologic: Negative.   Neurological:       Forgetful  Hematological: Negative for adenopathy. Does not bruise/bleed easily.  Psychiatric/Behavioral: The patient is nervous/anxious (mild).     Filed Vitals:   04/12/14 1207  BP: 144/56  Pulse: 80  Temp: 97.9 F (36.6 C)  Resp: 20  Height: 4\' 10"  (1.473 m)  Weight: 131 lb 4.8 oz (59.557 kg)   Body mass index is 27.45 kg/(m^2).  Physical Exam  Constitutional: She is oriented to person, place, and time.  Frail. Elderly.  HENT:  Head: Normocephalic.  Right Ear: External ear normal.  Left Ear: External ear normal.  Mouth/Throat: No oropharyngeal exudate.  Torus mandibularis.   Eyes: Conjunctivae and EOM are normal. Pupils are equal, round, and reactive to light.  Neck: No JVD present. No tracheal deviation present. No thyromegaly present.  Cardiovascular: Normal rate, regular rhythm, normal heart sounds and intact distal pulses.  Exam reveals no gallop.  No murmur heard. Pulmonary/Chest: No respiratory distress. She has no wheezes. She has rales. She exhibits no tenderness.  Abdominal: She exhibits no distension and no mass. There is no tenderness.  Musculoskeletal: Normal range of motion. She exhibits no edema and no tenderness.  Unstable gait. Using walker.  Lymphadenopathy:    She has no cervical adenopathy.  Neurological: She is alert and oriented to person, place, and time. No cranial nerve deficit.  Skin: No rash noted. No erythema. No pallor.  1.5 by 1.0 lesion oof the left lower leg medially in the calf area.   Psychiatric: She has a normal mood and affect. Her speech is normal and behavior is normal. Judgment and thought content normal. Cognition and memory are impaired. She exhibits abnormal recent memory and abnormal remote memory.     Labs reviewed: No visits with results within 3 Month(s) from this visit. Latest known visit with results is:  Nursing Home on 10/04/2013  Component Date Value Ref Range Status  . Hemoglobin 09/26/2013 12.3  12.0 - 16.0 g/dL Final  . HCT 09/26/2013 37  36 - 46 % Final  . Platelets 09/26/2013 254  150 - 399 K/L Final  . WBC 09/26/2013 7.7   Final  . Glucose 09/26/2013 86   Final  . BUN 09/26/2013 19  4 - 21 mg/dL Final  . Creatinine 09/26/2013 1.2* 0.5 - 1.1 mg/dL Final  . Potassium 09/26/2013 4.0  3.4 - 5.3 mmol/L Final  . Sodium 09/26/2013 141  137 - 147 mmol/L Final  . Alkaline Phosphatase 09/26/2013 63  25 - 125 U/L Final  . ALT 09/26/2013 8  7 - 35 U/L Final  . AST 09/26/2013 24  13 - 35 U/L Final  . Bilirubin, Total 09/26/2013 0.5   Final  . TSH 09/26/2013 2.68  0.41 - 5.90 uIU/mL Final     Assessment/Plan  .1. SCC (squamous cell carcinoma), leg, left - referral to general surgery for removal of the lesion.

## 2014-04-16 ENCOUNTER — Encounter: Payer: Self-pay | Admitting: Internal Medicine

## 2014-04-16 DIAGNOSIS — R2681 Unsteadiness on feet: Secondary | ICD-10-CM | POA: Insufficient documentation

## 2014-04-16 HISTORY — DX: Unsteadiness on feet: R26.81

## 2014-04-16 NOTE — Progress Notes (Signed)
Patient ID: Jasmin Rodriguez, female   DOB: 1914/06/22, 80 y.o.   MRN: [id number]    HISTORY AND PHYSICAL  Location:  [facility] Room Number: 2 Place of Service: SNF (31)   Extended Emergency Contact Information Primary Emergency Contact: Archie Endo Home Phone: [phone] Relation: None   Chief Complaint  Patient presents with  . Medical Management of Chronic Issues    Readmission to SNF by transfer from assisted living    HPI:  Patient was readmitted this illness and facility/7/15 and transferred from [facility] assisted living. Reason for transfer included dementia and increasing dependencies and stent.  Other problems include hypertension, hypothyroidism, edema, neck pain, and gait instability.  Past Medical History  Diagnosis Date  . Hypertension   . Anemia   . Thyroid disease   . Hyperlipidemia   . Osteopenia   . Radial fracture   . Femur fracture   . Dementia 10/12/2012  . Unstable gait 04/16/2014  . Edema 11/10/2012  . Unspecified constipation 01/02/2013    Past Surgical History  Procedure Laterality Date  . Hip fracture surgery      Patient Care Team: Estill Dooms, MD as PCP - General (Internal Medicine) Gearlean Alf, MD as Consulting Physician (Orthopedic Surgery) Linton Rump, MD as Consulting Physician (Ophthalmology) Vania Rea, MD as Consulting Physician (Obstetrics and Gynecology)  History   Social History  . Marital Status: Single    Spouse Name: N/A    Number of Children: N/A  . Years of Education: N/A   Occupational History  . Not on file.   Social History Main Topics  . Smoking status: Never Smoker   . Smokeless tobacco: Never Used  . Alcohol Use: No  . Drug Use: No  . Sexual Activity: No   Other Topics Concern  . Not on file   Social History Narrative  . No narrative on file     reports that she has never smoked. She has never used smokeless  tobacco. She reports that she does not drink alcohol or use illicit drugs.  Immunization History  Administered Date(s) Administered  . Influenza-Unspecified 05/04/2013  . Pneumococcal Polysaccharide-23 06/30/1995  . Td 06/29/2001    Allergies  Allergen Reactions  . Tramadol   . Zoloft [Sertraline Hcl]     Medications: Patient's Medications  New Prescriptions   No medications on file  Previous Medications   AMLODIPINE (NORVASC) 10 MG TABLET    Take 10 mg by mouth every morning.   CALCIUM CARB-CHOLECALCIFEROL (OYSTER SHELL CALCIUM + D PO)    Take by mouth. 500 mg-200 mg, 1 by mouth twice daily   CHOLECALCIFEROL (VITAMIN D) 1000 UNITS TABLET    Take 1,000 Units by mouth. 2 by mouth daily   DONEPEZIL (ARICEPT) 10 MG TABLET    Take 10 mg by mouth at bedtime.   LEVOTHYROXINE (SYNTHROID, LEVOTHROID) 100 MCG TABLET    Take 100 mcg by mouth daily before breakfast. **Check pulse weekly on Monday**   MULTIPLE VITAMIN (THERA/BETA-CAROTENE) TABS    Take by mouth. 1 by mouth daily   POLYETHYLENE GLYCOL (MIRALAX / GLYCOLAX) PACKET    Take 17 g by mouth. Fill cap to 17 GM mark, mix with 4-8 ounces of fluid and take by mouth every day  Modified Medications   Modified Medication Previous Medication   CITALOPRAM (CELEXA) 40 MG TABLET citalopram (CELEXA) 40 MG tablet  Take 0.5 tablets (20 mg total) by mouth every morning.    Take 40 mg by mouth every morning.   Discontinued Medications   HYDROCODONE-ACETAMINOPHEN (NORCO/VICODIN) 5-325 MG PER TABLET    Take 1 tablet by mouth. 1 by mouth twice daily as needed for breakthrough pain   MIRTAZAPINE (REMERON) 15 MG TABLET    Take 7.5 mg by mouth at bedtime.     Review of Systems  Constitutional: Negative for fever, chills, activity change, appetite change and unexpected weight change.  HENT: Positive for hearing loss. Negative for congestion, ear pain, rhinorrhea and sinus pressure.   Eyes: Negative.   Respiratory: Negative.   Cardiovascular:  Negative for chest pain, palpitations and leg swelling.  Gastrointestinal: Negative for nausea, abdominal pain, diarrhea, constipation and abdominal distention.  Endocrine: Negative.   Genitourinary: Negative for urgency, frequency and flank pain.  Musculoskeletal: Positive for back pain and gait problem (Using a walker). Negative for arthralgias, joint swelling, myalgias, neck pain and neck stiffness.  Skin: Negative for color change and pallor.  Allergic/Immunologic: Negative.   Neurological:       Forgetful  Hematological: Negative for adenopathy. Does not bruise/bleed easily.  Psychiatric/Behavioral: The patient is nervous/anxious (mild).     Filed Vitals:   10/19/13 1537  BP: 138/56  Pulse: 62  Temp: 98.4 F (36.9 C)  Resp: 16  Height: 5\' 3"  (1.6 m)  Weight: 138 lb (62.596 kg)   Body mass index is 24.45 kg/(m^2).  Physical Exam  Constitutional: She is oriented to person, place, and time.  Frail. Elderly.  HENT:  Head: Normocephalic.  Right Ear: External ear normal.  Left Ear: External ear normal.  Mouth/Throat: No oropharyngeal exudate.  Eyes: Conjunctivae and EOM are normal. Pupils are equal, round, and reactive to light.  Neck: No JVD present. No tracheal deviation present. No thyromegaly present.  Cardiovascular: Normal rate, regular rhythm and normal heart sounds.  Exam reveals no gallop.   No murmur heard. Weak dorsalis pedis and posterior tibial artery bilaterally  Pulmonary/Chest: No respiratory distress. She has no wheezes. She has rales. She exhibits no tenderness.  Abdominal: She exhibits no distension and no mass. There is no tenderness.  Musculoskeletal: Normal range of motion. She exhibits no edema and no tenderness.  Unstable gait. Using walker.  Lymphadenopathy:    She has no cervical adenopathy.  Neurological: She is alert and oriented to person, place, and time. No cranial nerve deficit.  Memory deficit  Skin: No rash noted. No erythema. No pallor.    Psychiatric: She has a normal mood and affect. Her speech is normal and behavior is normal. Judgment and thought content normal. Cognition and memory are impaired. She exhibits abnormal recent memory and abnormal remote memory.     Labs reviewed: Nursing Home on 10/04/2013  Component Date Value Ref Range Status  . Hemoglobin 09/26/2013 12.3  12.0 - 16.0 g/dL Final  . HCT 09/26/2013 37  36 - 46 % Final  . Platelets 09/26/2013 254  150 - 399 K/L Final  . WBC 09/26/2013 7.7   Final  . Glucose 09/26/2013 86   Final  . BUN 09/26/2013 19  4 - 21 mg/dL Final  . Creatinine 09/26/2013 1.2* 0.5 - 1.1 mg/dL Final  . Potassium 09/26/2013 4.0  3.4 - 5.3 mmol/L Final  . Sodium 09/26/2013 141  137 - 147 mmol/L Final  . Alkaline Phosphatase 09/26/2013 63  25 - 125 U/L Final  . ALT 09/26/2013 8  7 - 35 U/L  Final  . AST 09/26/2013 24  13 - 35 U/L Final  . Bilirubin, Total 09/26/2013 0.5   Final  . TSH 09/26/2013 2.68  0.41 - 5.90 uIU/mL Final     Assessment/Plan  1. Memory deficit Progressing  2. Unstable gait High risk for falls  I anticipate this patient will be a long-term care patient due to her progressing dementia and frailty as well as risk for falls.

## 2014-04-18 ENCOUNTER — Non-Acute Institutional Stay (SKILLED_NURSING_FACILITY): Payer: Medicare Other | Admitting: Nurse Practitioner

## 2014-04-18 ENCOUNTER — Encounter: Payer: Self-pay | Admitting: Nurse Practitioner

## 2014-04-18 DIAGNOSIS — F0390 Unspecified dementia without behavioral disturbance: Secondary | ICD-10-CM

## 2014-04-18 DIAGNOSIS — F32A Depression, unspecified: Secondary | ICD-10-CM

## 2014-04-18 DIAGNOSIS — C44729 Squamous cell carcinoma of skin of left lower limb, including hip: Secondary | ICD-10-CM

## 2014-04-18 DIAGNOSIS — E039 Hypothyroidism, unspecified: Secondary | ICD-10-CM

## 2014-04-18 DIAGNOSIS — F329 Major depressive disorder, single episode, unspecified: Secondary | ICD-10-CM

## 2014-04-18 DIAGNOSIS — I10 Essential (primary) hypertension: Secondary | ICD-10-CM

## 2014-04-18 DIAGNOSIS — K5900 Constipation, unspecified: Secondary | ICD-10-CM

## 2014-04-18 NOTE — Assessment & Plan Note (Signed)
Stable on MiraLax daily.  

## 2014-04-18 NOTE — Assessment & Plan Note (Signed)
Continue Levothyroxine 100mcg. TSH 2.685 09/26/13  

## 2014-04-18 NOTE — Assessment & Plan Note (Signed)
Controlled and takes Amlodipine 10mg daily   

## 2014-04-18 NOTE — Assessment & Plan Note (Signed)
Stable. Takes Celexa to 20mg  and Remeron 7.5mg  at night.

## 2014-04-18 NOTE — Assessment & Plan Note (Signed)
Referral made to surgeon for lesion removal.

## 2014-04-18 NOTE — Assessment & Plan Note (Signed)
Takes Aricept, requires SNF for care needs.    

## 2014-04-18 NOTE — Progress Notes (Signed)
Patient ID: Jasmin Rodriguez, female   DOB: 05/18/1914, 80 y.o.   MRN: [id number]   Code Status: DNR  Allergies  Allergen Reactions  . Tramadol   . Zoloft [Sertraline Hcl]     Chief Complaint  Patient presents with  . Medical Management of Chronic Issues    HPI: Patient is a 81 y.o. female seen in the SNF at [facility] today for evaluation of chronic medical conditions.  Problem List Items Addressed This Visit   SCC (squamous cell carcinoma), leg     Referral made to surgeon for lesion removal.     Hypothyroidism (Chronic)     Continue Levothyroxine 150mcg. TSH 2.685 09/26/13       HTN (hypertension) - Primary     Controlled and takes Amlodipine 10mg  daily      Depression     Stable. Takes Celexa to 20mg  and Remeron 7.5mg  at night.      Dementia     Takes Aricept, requires SNF for care needs.       Constipation     Stable on MiraLax daily.          Review of Systems:  Review of Systems  Constitutional: Negative for fever, chills, malaise/fatigue and diaphoresis.  HENT: Positive for hearing loss. Negative for congestion, ear pain and sore throat.   Eyes: Negative for pain, discharge and redness.  Respiratory: Negative for cough, sputum production, shortness of breath and wheezing.   Cardiovascular: Positive for PND. Negative for chest pain, palpitations, orthopnea, claudication and leg swelling.  Gastrointestinal: Negative for heartburn, nausea, vomiting, abdominal pain, diarrhea, constipation and blood in stool.  Genitourinary: Positive for frequency. Negative for dysuria, urgency, hematuria and flank pain.  Musculoskeletal: Positive for joint pain. Negative for back pain, falls, myalgias and neck pain.  Skin: Negative for itching and rash.       LLE skin lesion.   Neurological: Negative for dizziness, tingling, tremors, sensory change, speech change, focal weakness, seizures, loss of consciousness, weakness and headaches.  Endo/Heme/Allergies:  Negative for environmental allergies and polydipsia. Does not bruise/bleed easily.  Psychiatric/Behavioral: Positive for depression (flat affect) and memory loss. Negative for hallucinations. The patient is not nervous/anxious and does not have insomnia.      Past Medical History  Diagnosis Date  . Hypertension   . Anemia   . Thyroid disease   . Hyperlipidemia   . Osteopenia   . Radial fracture   . Femur fracture   . Dementia 10/12/2012  . Unstable gait 04/16/2014  . Edema 11/10/2012  . Unspecified constipation 01/02/2013   Past Surgical History  Procedure Laterality Date  . Hip fracture surgery     Social History:   reports that she has never smoked. She has never used smokeless tobacco. She reports that she does not drink alcohol or use illicit drugs.  No family history on file.  Medications: Patient's Medications  New Prescriptions   No medications on file  Previous Medications   AMLODIPINE (NORVASC) 10 MG TABLET    Take 10 mg by mouth every morning.   CALCIUM CARB-CHOLECALCIFEROL (OYSTER SHELL CALCIUM + D PO)    Take by mouth. 500 mg-200 mg, 1 by mouth twice daily   CHOLECALCIFEROL (VITAMIN D) 1000 UNITS TABLET    Take 1,000 Units by mouth. 2 by mouth daily   CITALOPRAM (CELEXA) 40 MG TABLET    Take 0.5 tablets (20 mg total) by mouth every morning.   DONEPEZIL (ARICEPT) 10 MG TABLET  Take 10 mg by mouth at bedtime.   LEVOTHYROXINE (SYNTHROID, LEVOTHROID) 100 MCG TABLET    Take 100 mcg by mouth daily before breakfast. **Check pulse weekly on Monday**   MULTIPLE VITAMIN (THERA/BETA-CAROTENE) TABS    Take by mouth. 1 by mouth daily   POLYETHYLENE GLYCOL (MIRALAX / GLYCOLAX) PACKET    Take 17 g by mouth. Fill cap to 17 GM mark, mix with 4-8 ounces of fluid and take by mouth every day  Modified Medications   No medications on file  Discontinued Medications   No medications on file     Physical Exam: Physical Exam  Constitutional: She is oriented to person, place, and  time.  Frail. Elderly.  HENT:  Head: Normocephalic.  Right Ear: External ear normal.  Left Ear: External ear normal.  Mouth/Throat: No oropharyngeal exudate.  Torus mandibularis.   Eyes: Conjunctivae and EOM are normal. Pupils are equal, round, and reactive to light.  Neck: No JVD present. No tracheal deviation present. No thyromegaly present.  Cardiovascular: Normal rate, regular rhythm, normal heart sounds and intact distal pulses.  Exam reveals no gallop.   No murmur heard. Pulmonary/Chest: No respiratory distress. She has no wheezes. She has rales. She exhibits no tenderness.  Abdominal: She exhibits no distension and no mass. There is no tenderness.  Musculoskeletal: Normal range of motion. She exhibits no edema and no tenderness.  Unstable gait. Using walker.  Lymphadenopathy:    She has no cervical adenopathy.  Neurological: She is alert and oriented to person, place, and time. No cranial nerve deficit.  Skin: No rash noted. No erythema. No pallor.  1.5 by 1.0 lesionoof the left lower leg medially in the calf area.  Psychiatric: She has a normal mood and affect. Her speech is normal and behavior is normal. Judgment and thought content normal. Cognition and memory are impaired. She exhibits abnormal recent memory and abnormal remote memory.    Filed Vitals:   04/18/14 1100  BP: 138/80  Pulse: 80  Temp: 98 F (36.7 C)  TempSrc: Tympanic  Resp: 16      Labs reviewed: Basic Metabolic Panel:  Recent Labs  06/15/13 09/26/13  NA 136* 141  K 3.6 4.0  BUN 19 19  CREATININE 1.5* 1.2*  TSH 0.44 2.68   Liver Function Tests:  Recent Labs  06/15/13 09/26/13  AST 54* 24  ALT 17 8  ALKPHOS 60 63   No results found for this basename: LIPASE, AMYLASE,  in the last 8760 hours No results found for this basename: AMMONIA,  in the last 8760 hours CBC:  Recent Labs  06/15/13 09/26/13  WBC 12.5 7.7  HGB 11.5* 12.3  HCT 34* 37  PLT 224 254   Lipid Panel: No results  found for this basename: CHOL, HDL, LDLCALC, TRIG, CHOLHDL, LDLDIRECT,  in the last 8760 hours  Past Procedures:  NA   Assessment/Plan Dementia Takes Aricept, requires SNF for care needs.     Depression Stable. Takes Celexa to 20mg  and Remeron 7.5mg  at night.    HTN (hypertension) Controlled and takes Amlodipine 10mg  daily    Hypothyroidism Continue Levothyroxine 121mcg. TSH 2.685 09/26/13     Constipation Stable on MiraLax daily.     SCC (squamous cell carcinoma), leg Referral made to surgeon for lesion removal.     Family/ Staff Communication: observe the patient  Goals of Care: SNF  Labs/tests ordered: none

## 2014-04-25 ENCOUNTER — Encounter: Payer: Self-pay | Admitting: Nurse Practitioner

## 2014-05-01 DIAGNOSIS — C4490 Unspecified malignant neoplasm of skin, unspecified: Secondary | ICD-10-CM

## 2014-05-01 HISTORY — PX: SKIN BIOPSY: SHX1

## 2014-05-01 HISTORY — DX: Unspecified malignant neoplasm of skin, unspecified: C44.90

## 2014-05-17 ENCOUNTER — Ambulatory Visit: Payer: Medicare Other

## 2014-05-17 ENCOUNTER — Encounter: Payer: Self-pay | Admitting: Radiation Oncology

## 2014-05-17 ENCOUNTER — Ambulatory Visit: Payer: Medicare Other | Admitting: Radiation Oncology

## 2014-05-17 NOTE — Progress Notes (Addendum)
Histology and Location of Primary Skin Cancer: Left Calf  [place] M[place]ambridge referred to Dr. Camillo Flaming o 05/01/14 for questionable lesion on her left lower leg.  Unsure how this had been present and Ms. Kroeger denied any symptoms at this time. Area assessed as a 25 mm - 3 cm exophytic nodule with negative poplitieal lymh nodes. - r/0 SCC. [facility] for surgery vs. Topical therapy  05/01/14 Shaved Biopsy of Skin- Left Calf  Well Differentiated Invasive Squamous Cell Carcinoma, Extending to the Deep Margin  Past/Anticipated interventions by patient's surgeon/dermatologist for current problematic lesion, if any: Dr. Camillo Flaming -Shaved Biopsy  Past skin cancers, if any: no  1) Location/Histology/Intervention:   2) Location/Histology/Intervention:   3) Location/Histology/Intervention:   History of Blistering sunburns, if any: no  SAFETY ISSUES:  Prior radiation?No  Pacemaker/ICD? No  Possible current pregnancy? No  Is the patient on methotrexate? NO  Current Complaints / other details:  No Tanning bed use, No suncreen use. Living in assisted living of [facility] Patient has bandage on left lower leg. Mr Jerene Pitch, [place] states it is changed twice daily. He and patient do not know if any medication is applied.

## 2014-05-18 ENCOUNTER — Encounter: Payer: Self-pay | Admitting: Radiation Oncology

## 2014-05-18 ENCOUNTER — Ambulatory Visit
Admission: RE | Admit: 2014-05-18 | Discharge: 2014-05-18 | Disposition: A | Discharge status: Home / Self Care | Payer: Medicare Other | Source: Ambulatory Visit | Attending: Radiation Oncology | Admitting: Radiation Oncology

## 2014-05-18 ENCOUNTER — Institutional Professional Consult (permissible substitution): Payer: Medicare Other | Admitting: Radiation Oncology

## 2014-05-18 VITALS — BP 156/49 | HR 62 | Temp 98.4°F | Resp 20 | Ht <= 58 in | Wt 130.0 lb

## 2014-05-18 DIAGNOSIS — Z510 Encounter for antineoplastic radiation therapy: Secondary | ICD-10-CM | POA: Insufficient documentation

## 2014-05-18 DIAGNOSIS — C44729 Squamous cell carcinoma of skin of left lower limb, including hip: Secondary | ICD-10-CM

## 2014-05-18 HISTORY — DX: Vitamin D deficiency, unspecified: E55.9

## 2014-05-18 HISTORY — DX: Hypothyroidism, unspecified: E03.9

## 2014-05-18 HISTORY — DX: Nonrheumatic mitral (valve) prolapse: I34.1

## 2014-05-18 HISTORY — DX: Unspecified malignant neoplasm of skin, unspecified: C44.90

## 2014-05-18 NOTE — Progress Notes (Signed)
Please see the Nurse Progress Note in the MD Initial Consult Encounter for this patient. 

## 2014-05-18 NOTE — Progress Notes (Signed)
Radiation Oncology         [phone] ________________________________  Initial outpatient Consultation  Name: Jasmin Rodriguez MRN: [id number]  Date: 05/18/2014  DOB: [birth year]  GY:FVCBS, Viviann Spare, MD  Haverstock, Jennefer Bravo*   REFERRING PHYSICIAN: Haverstock, Christina L*  DIAGNOSIS: Clinical T2N0M0 Stage II Well Differentiated Squamous cell carcinoma, left lower leg    ICD-9-CM ICD-10-CM   1. Squamous cell carcinoma of lower leg, left 173.72 C44.729     HISTORY OF PRESENT ILLNESS::Jasmin Rodriguez is a 80 y.o. female who presented with a lesion on her left lower leg. Unsure how long this had been present - but she reportedly had a negative full skin exam in April.  She saw DR. Haverstock who appreciated a 2.5 cm - 3 cm exophytic nodule with negative poplitieal lymh nodes. She pursued Shave Biopsy:  05/01/14 Shaved Biopsy of Skin- Left Calf  Well Differentiated Invasive Squamous Cell Carcinoma, Extending to the Deep Margin  She is seeing Dr. Harvel Quale in a few days to discuss possible Mohs surgery.  If general anesthesia would be required, this is a concern that would need to be concerned, given her age/fraility.   She denies prior cancers. She does not know of excessive sunburns as a child, but liked being outdoors. She grew up in [place]. She denies pain from this lesion.  PREVIOUS RADIATION THERAPY: No  PAST MEDICAL HISTORY:  has a past medical history of Hypertension; Anemia; Hypothyroidism; Hyperlipidemia; Osteopenia; Radial fracture; Femur fracture; Dementia (10/12/2012); Unstable gait (04/16/2014); Edema (11/10/2012); Unspecified constipation (01/02/2013); Skin cancer (05/01/14); Mitral valve prolapse; and Vitamin D deficiency.    PAST SURGICAL HISTORY: Past Surgical History  Procedure Laterality Date  . Total hip arthroplasty  2013  . Abdominal hysterectomy    . Skin biopsy Left 05/01/14    Left calf    FAMILY HISTORY: family history is not on file.  SOCIAL  HISTORY:  reports that she has never smoked. She has never used smokeless tobacco. She reports that she does not drink alcohol or use illicit drugs.  ALLERGIES: Tramadol and Zoloft  MEDICATIONS:  Current Outpatient Prescriptions  Medication Sig Dispense Refill  . amLODipine (NORVASC) 10 MG tablet Take 10 mg by mouth daily.    . Calcium Carb-Cholecalciferol (OYSTER SHELL CALCIUM + D PO) Take by mouth. 500 mg-200 mg, 1 by mouth twice daily    . cholecalciferol (VITAMIN D) 1000 UNITS tablet Take 1,000 Units by mouth. 2 by mouth daily    . citalopram (CELEXA) 40 MG tablet Take 0.5 tablets (20 mg total) by mouth every morning. 15 tablet five  . donepezil (ARICEPT) 10 MG tablet Take 10 mg by mouth at bedtime.    Marland Kitchen levothyroxine (SYNTHROID, LEVOTHROID) 100 MCG tablet Take 100 mcg by mouth daily before breakfast. **Check pulse weekly on Monday**    . Multiple Vitamin (THERA/BETA-CAROTENE) TABS Take by mouth. 1 by mouth daily    . polyethylene glycol (MIRALAX / GLYCOLAX) packet Take 17 g by mouth. Fill cap to 17 GM mark, mix with 4-8 ounces of fluid and take by mouth every day    . amLODipine (NORVASC) 10 MG tablet Take 10 mg by mouth every morning.    Marland Kitchen HYDROcodone-acetaminophen (NORCO/VICODIN) 5-325 MG per tablet Take 1 tablet by mouth every 4 (four) hours as needed for moderate pain.    . mirtazapine (REMERON) 7.5 MG tablet Take 7.5 mg by mouth at bedtime.     No current facility-administered medications for this encounter.  REVIEW OF SYSTEMS:  Notable for that above.   PHYSICAL EXAM:  height is 4\' 10"  (1.473 m) and weight is 130 lb (58.968 kg). Her oral temperature is 98.4 F (36.9 C). Her blood pressure is 156/49 and her pulse is 62. Her respiration is 20.   General: in no acute distress HEENT:  Multiple tori over palate, floor of mouth/mandible. Neck: Neck is supple, no palpable cervical or supraclavicular lymphadenopathy. Heart: Regular in rate and rhythm with no murmurs, rubs, or  gallops. Chest: Clear to auscultation bilaterally, with no rhonchi, wheezes, or rales. Abdomen: Soft, nontender, nondistended, with no rigidity or guarding. Lymphatics: see Neck Exam; also, no popliteal lymphadenopathy Skin: raised moles over neck, scabbed lesions over chin.  Approximately 2.5 by 3cm lesion of medial/inner left lower leg, between calf and shin, raised/fungating. Neurologic: Cranial nerves II through XII are grossly intact.   Psychiatric: questionable memory; her POA gives most of the history  ECOG = 3  0 - Asymptomatic (Fully active, able to carry on all predisease activities without restriction)  1 - Symptomatic but completely ambulatory (Restricted in physically strenuous activity but ambulatory and able to carry out work of a light or sedentary nature. For example, light housework, office work)  2 - Symptomatic, <50% in bed during the day (Ambulatory and capable of all self care but unable to carry out any work activities. Up and about more than 50% of waking hours)  3 - Symptomatic, >50% in bed, but not bedbound (Capable of only limited self-care, confined to bed or chair 50% or more of waking hours)  4 - Bedbound (Completely disabled. Cannot carry on any self-care. Totally confined to bed or chair)  5 - Death   Eustace Pen MM, Creech RH, Tormey DC, et al. [phone]). "Toxicity and response criteria of the [facility]". [place] Oncol. 5 (6): 649-55   LABORATORY DATA:  Lab Results  Component Value Date   WBC 7.7 09/26/2013   HGB 12.3 09/26/2013   HCT 37 09/26/2013   MCV 85.8 10/30/2012   PLT 254 09/26/2013   CMP     Component Value Date/Time   NA 141 09/26/2013   NA 140 10/30/2012 1923   K 4.0 09/26/2013   CL 106 10/30/2012 1923   CO2 23 12/07/2011 0417   GLUCOSE 129* 10/30/2012 1923   BUN 19 09/26/2013   BUN 23 10/30/2012 1923   CREATININE 1.2* 09/26/2013   CREATININE 1.40* 10/30/2012 1923   CALCIUM 8.1* 12/07/2011 0417   PROT 6.3  10/21/2009 2325   ALBUMIN 3.3* 10/21/2009 2325   AST 24 09/26/2013   ALT 8 09/26/2013   ALKPHOS 63 09/26/2013   BILITOT 0.6 10/21/2009 2325   GFRNONAA 50* 12/07/2011 0417   GFRAA 58* 12/07/2011 0417         RADIOGRAPHY: No results found.    IMPRESSION/PLAN: Clinical T2N0M0 Stage II Well Differentiated Squamous cell carcinoma, left lower leg  I spoke with Ms Runkle and her POA, [name], about her diagnosis and the potential for cancer spread and significant morbidity if it is untreated.    Mohs surgery is an alterative to RT for potential cure.  My concern with RT is the risk of a non-healing wound of the lower leg due to compromised vascularity in this region. More common side effects would be skin irritation and fatigue.  While the standard course would be RT 5 x's/week for 6-7 weeks, I would consider twice weekly for 5 weeks (4 Gy twice a  week to total dose of 40Gy.)  I will discuss this with my colleague, confer with Dr. Harvel Quale after his consult for Mohs on 11/23, and then either I or Dr Harvel Quale will get in touch with the patient once the best possible option is ascertained for her.  She and [name] have my contact information and know to call if any questions arise.  They are pleased with this plan.   __________________________________________   Eppie Gibson, MD

## 2014-05-21 ENCOUNTER — Non-Acute Institutional Stay (SKILLED_NURSING_FACILITY): Payer: Medicare Other | Admitting: Nurse Practitioner

## 2014-05-21 ENCOUNTER — Encounter: Payer: Self-pay | Admitting: Nurse Practitioner

## 2014-05-21 ENCOUNTER — Telehealth: Payer: Self-pay | Admitting: Radiation Oncology

## 2014-05-21 DIAGNOSIS — I10 Essential (primary) hypertension: Secondary | ICD-10-CM

## 2014-05-21 DIAGNOSIS — F329 Major depressive disorder, single episode, unspecified: Secondary | ICD-10-CM

## 2014-05-21 DIAGNOSIS — R2681 Unsteadiness on feet: Secondary | ICD-10-CM

## 2014-05-21 DIAGNOSIS — F32A Depression, unspecified: Secondary | ICD-10-CM

## 2014-05-21 DIAGNOSIS — F0390 Unspecified dementia without behavioral disturbance: Secondary | ICD-10-CM

## 2014-05-21 DIAGNOSIS — C44729 Squamous cell carcinoma of skin of left lower limb, including hip: Secondary | ICD-10-CM

## 2014-05-21 DIAGNOSIS — K5900 Constipation, unspecified: Secondary | ICD-10-CM

## 2014-05-21 DIAGNOSIS — E039 Hypothyroidism, unspecified: Secondary | ICD-10-CM

## 2014-05-21 NOTE — Assessment & Plan Note (Signed)
Ambulates with walker-unsteady.

## 2014-05-21 NOTE — Progress Notes (Signed)
Patient ID: Jasmin Rodriguez, female   DOB: [birth year], 80 y.o.   MRN: [id number]   Code Status: DNR  Allergies  Allergen Reactions  . Tramadol   . Zoloft [Sertraline Hcl]     Chief Complaint  Patient presents with  . Medical Management of Chronic Issues    HPI: Patient is a 80 y.o. female seen in the SNF at [facility] today for evaluation of chronic medical conditions.  Problem List Items Addressed This Visit    Unstable gait    Ambulates with walker-unsteady.     SCC (squamous cell carcinoma), leg - Primary    04/24/14 general surgeon: likely SCC to the left lower leg: non operative treatment. Refer to dermatology to see there is any topical options available. May consider regional block and excision if there are no topical options.  05/01/14 Dermatology L calf lesion-biopsy. 1120/15: well differentiated invasive Squamous Cell Carcinoma. Mohs vs RT-Dr. Harvel Quale 05/21/14     Hypothyroidism (Chronic)    Continue Levothyroxine 135mcg. TSH 2.685 09/26/13      HTN (hypertension)    Controlled and takes Amlodipine 10mg  daily      Depression    Stable. Takes Celexa to 20mg  and off Remeron 7.5mg  at night.       Dementia    Takes Aricept, requires SNF for care needs.       Constipation    Stable on MiraLax daily.           Review of Systems:  Review of Systems  Constitutional: Negative for fever, chills, malaise/fatigue and diaphoresis.  HENT: Positive for hearing loss. Negative for congestion, ear pain and sore throat.   Eyes: Negative for pain, discharge and redness.  Respiratory: Negative for cough, sputum production, shortness of breath and wheezing.   Cardiovascular: Positive for PND. Negative for chest pain, palpitations, orthopnea, claudication and leg swelling.  Gastrointestinal: Negative for heartburn, nausea, vomiting, abdominal pain, diarrhea, constipation and blood in stool.  Genitourinary: Positive for frequency. Negative for dysuria,  urgency, hematuria and flank pain.  Musculoskeletal: Positive for joint pain. Negative for myalgias, back pain, falls and neck pain.  Skin: Negative for itching and rash.       LLE calf skin lesion.   Neurological: Negative for dizziness, tingling, tremors, sensory change, speech change, focal weakness, seizures, loss of consciousness, weakness and headaches.  Endo/Heme/Allergies: Negative for environmental allergies and polydipsia. Does not bruise/bleed easily.  Psychiatric/Behavioral: Positive for depression (flat affect) and memory loss. Negative for hallucinations. The patient is not nervous/anxious and does not have insomnia.      Past Medical History  Diagnosis Date  . Hypertension   . Anemia   . Hypothyroidism   . Hyperlipidemia   . Osteopenia   . Radial fracture   . Femur fracture   . Dementia 10/12/2012  . Unstable gait 04/16/2014  . Edema 11/10/2012  . Unspecified constipation 01/02/2013  . Skin cancer 05/01/14    left calf  . Mitral valve prolapse   . Vitamin D deficiency    Past Surgical History  Procedure Laterality Date  . Total hip arthroplasty  2013  . Abdominal hysterectomy    . Skin biopsy Left 05/01/14    Left calf   Social History:   reports that she has never smoked. She has never used smokeless tobacco. She reports that she does not drink alcohol or use illicit drugs.  No family history on file.  Medications: Patient's Medications  New Prescriptions   No medications  on file  Previous Medications   AMLODIPINE (NORVASC) 10 MG TABLET    Take 10 mg by mouth every morning.   AMLODIPINE (NORVASC) 10 MG TABLET    Take 10 mg by mouth daily.   CALCIUM CARB-CHOLECALCIFEROL (OYSTER SHELL CALCIUM + D PO)    Take by mouth. 500 mg-200 mg, 1 by mouth twice daily   CHOLECALCIFEROL (VITAMIN D) 1000 UNITS TABLET    Take 1,000 Units by mouth. 2 by mouth daily   CITALOPRAM (CELEXA) 40 MG TABLET    Take 0.5 tablets (20 mg total) by mouth every morning.   DONEPEZIL  (ARICEPT) 10 MG TABLET    Take 10 mg by mouth at bedtime.   HYDROCODONE-ACETAMINOPHEN (NORCO/VICODIN) 5-325 MG PER TABLET    Take 1 tablet by mouth every 4 (four) hours as needed for moderate pain.   LEVOTHYROXINE (SYNTHROID, LEVOTHROID) 100 MCG TABLET    Take 100 mcg by mouth daily before breakfast. **Check pulse weekly on Monday**   MIRTAZAPINE (REMERON) 7.5 MG TABLET    Take 7.5 mg by mouth at bedtime.   MULTIPLE VITAMIN (THERA/BETA-CAROTENE) TABS    Take by mouth. 1 by mouth daily   POLYETHYLENE GLYCOL (MIRALAX / GLYCOLAX) PACKET    Take 17 g by mouth. Fill cap to 17 GM mark, mix with 4-8 ounces of fluid and take by mouth every day  Modified Medications   No medications on file  Discontinued Medications   No medications on file     Physical Exam: Physical Exam  Constitutional: She is oriented to person, place, and time.  Frail. Elderly.  HENT:  Head: Normocephalic.  Right Ear: External ear normal.  Left Ear: External ear normal.  Mouth/Throat: No oropharyngeal exudate.  Torus mandibularis.   Eyes: Conjunctivae and EOM are normal. Pupils are equal, round, and reactive to light.  Neck: No JVD present. No tracheal deviation present. No thyromegaly present.  Cardiovascular: Normal rate, regular rhythm, normal heart sounds and intact distal pulses.  Exam reveals no gallop.   No murmur heard. Pulmonary/Chest: No respiratory distress. She has no wheezes. She has rales. She exhibits no tenderness.  Abdominal: She exhibits no distension and no mass. There is no tenderness.  Musculoskeletal: Normal range of motion. She exhibits no edema or tenderness.  Unstable gait. Using walker.  Lymphadenopathy:    She has no cervical adenopathy.  Neurological: She is alert and oriented to person, place, and time. No cranial nerve deficit.  Skin: No rash noted. No erythema. No pallor.  1.5 by 1.0 lesionoof the left lower leg medially in the calf area.  Psychiatric: She has a normal mood and affect.  Her speech is normal and behavior is normal. Judgment and thought content normal. Cognition and memory are impaired. She exhibits abnormal recent memory and abnormal remote memory.    Filed Vitals:   05/21/14 1026  BP: 130/74  Pulse: 68  Temp: 97.4 F (36.3 C)  TempSrc: Tympanic  Resp: 18      Labs reviewed: Basic Metabolic Panel:  Recent Labs  06/15/13 09/26/13  NA 136* 141  K 3.6 4.0  BUN 19 19  CREATININE 1.5* 1.2*  TSH 0.44 2.68   Liver Function Tests:  Recent Labs  06/15/13 09/26/13  AST 54* 24  ALT 17 8  ALKPHOS 60 63   No results for input(s): LIPASE, AMYLASE in the last 8760 hours. No results for input(s): AMMONIA in the last 8760 hours. CBC:  Recent Labs  06/15/13 09/26/13  WBC  12.5 7.7  HGB 11.5* 12.3  HCT 34* 37  PLT 224 254   Lipid Panel: No results for input(s): CHOL, HDL, LDLCALC, TRIG, CHOLHDL, LDLDIRECT in the last 8760 hours.  Past Procedures:  NA   Assessment/Plan SCC (squamous cell carcinoma), leg 04/24/14 general surgeon: likely SCC to the left lower leg: non operative treatment. Refer to dermatology to see there is any topical options available. May consider regional block and excision if there are no topical options.  05/01/14 Dermatology L calf lesion-biopsy. 1120/15: well differentiated invasive Squamous Cell Carcinoma. Mohs vs RT-Dr. Harvel Quale 05/21/14   Unstable gait Ambulates with walker-unsteady.   Hypothyroidism Continue Levothyroxine 14mcg. TSH 2.685 09/26/13    HTN (hypertension) Controlled and takes Amlodipine 10mg  daily    Depression Stable. Takes Celexa to 20mg  and off Remeron 7.5mg  at night.     Dementia Takes Aricept, requires SNF for care needs.     Constipation Stable on MiraLax daily.        Family/ Staff Communication: observe the patient  Goals of Care: SNF  Labs/tests ordered: none

## 2014-05-21 NOTE — Assessment & Plan Note (Signed)
Stable. Takes Celexa to 20mg and off Remeron 7.5mg at night.    

## 2014-05-21 NOTE — Assessment & Plan Note (Signed)
Continue Levothyroxine 100mcg. TSH 2.685 09/26/13  

## 2014-05-21 NOTE — Assessment & Plan Note (Signed)
Takes Aricept, requires SNF for care needs.    

## 2014-05-21 NOTE — Assessment & Plan Note (Signed)
Controlled and takes Amlodipine 10mg daily   

## 2014-05-21 NOTE — Telephone Encounter (Signed)
Spoke with [name], the patient's POA, after my talk with Dr. Harvel Quale today.  Dr. Harvel Quale recommends Mohs surgery, and I agree this would be easier on the pt than 10 RT treatments. [name] agrees as well, and appreciated my call. Plan to proceed with surgery.  -----------------------------------  Eppie Gibson, MD

## 2014-05-21 NOTE — Assessment & Plan Note (Signed)
04/24/14 general surgeon: likely SCC to the left lower leg: non operative treatment. Refer to dermatology to see there is any topical options available. May consider regional block and excision if there are no topical options.  05/01/14 Dermatology L calf lesion-biopsy. 1120/15: well differentiated invasive Squamous Cell Carcinoma. Mohs vs RT-Dr. Harvel Quale 05/21/14

## 2014-05-21 NOTE — Assessment & Plan Note (Signed)
Stable on MiraLax daily.  

## 2014-06-11 ENCOUNTER — Non-Acute Institutional Stay (SKILLED_NURSING_FACILITY): Payer: Medicare Other | Admitting: Nurse Practitioner

## 2014-06-11 ENCOUNTER — Encounter: Payer: Self-pay | Admitting: Nurse Practitioner

## 2014-06-11 DIAGNOSIS — K5900 Constipation, unspecified: Secondary | ICD-10-CM

## 2014-06-11 DIAGNOSIS — I10 Essential (primary) hypertension: Secondary | ICD-10-CM

## 2014-06-11 DIAGNOSIS — F32A Depression, unspecified: Secondary | ICD-10-CM

## 2014-06-11 DIAGNOSIS — F0390 Unspecified dementia without behavioral disturbance: Secondary | ICD-10-CM

## 2014-06-11 DIAGNOSIS — E039 Hypothyroidism, unspecified: Secondary | ICD-10-CM

## 2014-06-11 DIAGNOSIS — F329 Major depressive disorder, single episode, unspecified: Secondary | ICD-10-CM

## 2014-06-11 NOTE — Assessment & Plan Note (Signed)
Takes Aricept, requires SNF for care needs.    

## 2014-06-11 NOTE — Assessment & Plan Note (Signed)
Stable. Takes Celexa to 20mg and off Remeron 7.5mg at night.    

## 2014-06-11 NOTE — Assessment & Plan Note (Signed)
Continue Levothyroxine 100mcg. TSH 2.685 09/26/13  

## 2014-06-11 NOTE — Progress Notes (Signed)
Patient ID: Jasmin Rodriguez, female   DOB: [birth year], 80 y.o.   MRN: [id number]   Code Status: DNR  Allergies  Allergen Reactions  . Tramadol   . Zoloft [Sertraline Hcl]     Chief Complaint  Patient presents with  . Medical Management of Chronic Issues    HPI: Patient is a 80 y.o. female seen in the SNF at [facility] today for evaluation of chronic medical conditions.  Problem List Items Addressed This Visit    Hypothyroidism (Chronic)    Continue Levothyroxine 136mcg. TSH 2.685 09/26/13      HTN (hypertension) - Primary    Controlled and takes Amlodipine 10mg  daily     Depression    Stable. Takes Celexa to 20mg  and off Remeron 7.5mg  at night.       Dementia    Takes Aricept, requires SNF for care needs.      Constipation    Stable on MiraLax daily.          Review of Systems:  Review of Systems  Constitutional: Negative for fever, chills, malaise/fatigue and diaphoresis.  HENT: Positive for hearing loss. Negative for congestion, ear pain and sore throat.   Eyes: Negative for pain, discharge and redness.  Respiratory: Negative for cough, sputum production, shortness of breath and wheezing.   Cardiovascular: Positive for PND. Negative for chest pain, palpitations, orthopnea, claudication and leg swelling.  Gastrointestinal: Negative for heartburn, nausea, vomiting, abdominal pain, diarrhea, constipation and blood in stool.  Genitourinary: Positive for frequency. Negative for dysuria, urgency, hematuria and flank pain.  Musculoskeletal: Positive for joint pain. Negative for myalgias, back pain, falls and neck pain.  Skin: Negative for itching and rash.       LLE calf skin lesion.   Neurological: Negative for dizziness, tingling, tremors, sensory change, speech change, focal weakness, seizures, loss of consciousness, weakness and headaches.  Endo/Heme/Allergies: Negative for environmental allergies and polydipsia. Does not bruise/bleed easily.    Psychiatric/Behavioral: Positive for depression (flat affect) and memory loss. Negative for hallucinations. The patient is not nervous/anxious and does not have insomnia.      Past Medical History  Diagnosis Date  . Hypertension   . Anemia   . Hypothyroidism   . Hyperlipidemia   . Osteopenia   . Radial fracture   . Femur fracture   . Dementia 10/12/2012  . Unstable gait 04/16/2014  . Edema 11/10/2012  . Unspecified constipation 01/02/2013  . Skin cancer 05/01/14    left calf  . Mitral valve prolapse   . Vitamin D deficiency    Past Surgical History  Procedure Laterality Date  . Total hip arthroplasty  2013  . Abdominal hysterectomy    . Skin biopsy Left 05/01/14    Left calf   Social History:   reports that she has never smoked. She has never used smokeless tobacco. She reports that she does not drink alcohol or use illicit drugs.  No family history on file.  Medications: Patient's Medications  New Prescriptions   No medications on file  Previous Medications   AMLODIPINE (NORVASC) 10 MG TABLET    Take 10 mg by mouth every morning.   AMLODIPINE (NORVASC) 10 MG TABLET    Take 10 mg by mouth daily.   CALCIUM CARB-CHOLECALCIFEROL (OYSTER SHELL CALCIUM + D PO)    Take by mouth. 500 mg-200 mg, 1 by mouth twice daily   CHOLECALCIFEROL (VITAMIN D) 1000 UNITS TABLET    Take 1,000 Units by mouth. 2 by  mouth daily   CITALOPRAM (CELEXA) 40 MG TABLET    Take 0.5 tablets (20 mg total) by mouth every morning.   DONEPEZIL (ARICEPT) 10 MG TABLET    Take 10 mg by mouth at bedtime.   HYDROCODONE-ACETAMINOPHEN (NORCO/VICODIN) 5-325 MG PER TABLET    Take 1 tablet by mouth every 4 (four) hours as needed for moderate pain.   LEVOTHYROXINE (SYNTHROID, LEVOTHROID) 100 MCG TABLET    Take 100 mcg by mouth daily before breakfast. **Check pulse weekly on Monday**   MIRTAZAPINE (REMERON) 7.5 MG TABLET    Take 7.5 mg by mouth at bedtime.   MULTIPLE VITAMIN (THERA/BETA-CAROTENE) TABS    Take by mouth. 1  by mouth daily   POLYETHYLENE GLYCOL (MIRALAX / GLYCOLAX) PACKET    Take 17 g by mouth. Fill cap to 17 GM mark, mix with 4-8 ounces of fluid and take by mouth every day  Modified Medications   No medications on file  Discontinued Medications   No medications on file     Physical Exam: Physical Exam  Constitutional: She is oriented to person, place, and time.  Frail. Elderly.  HENT:  Head: Normocephalic.  Right Ear: External ear normal.  Left Ear: External ear normal.  Mouth/Throat: No oropharyngeal exudate.  Torus mandibularis.   Eyes: Conjunctivae and EOM are normal. Pupils are equal, round, and reactive to light.  Neck: No JVD present. No tracheal deviation present. No thyromegaly present.  Cardiovascular: Normal rate, regular rhythm, normal heart sounds and intact distal pulses.  Exam reveals no gallop.   No murmur heard. Pulmonary/Chest: No respiratory distress. She has no wheezes. She has rales. She exhibits no tenderness.  Abdominal: She exhibits no distension and no mass. There is no tenderness.  Musculoskeletal: Normal range of motion. She exhibits no edema or tenderness.  Unstable gait. Using walker.  Lymphadenopathy:    She has no cervical adenopathy.  Neurological: She is alert and oriented to person, place, and time. No cranial nerve deficit.  Skin: No rash noted. No erythema. No pallor.  1.5 by 1.0 lesionoof the left lower leg medially in the calf area.  Psychiatric: She has a normal mood and affect. Her speech is normal and behavior is normal. Judgment and thought content normal. Cognition and memory are impaired. She exhibits abnormal recent memory and abnormal remote memory.    Filed Vitals:   06/11/14 1106  BP: 140/70  Pulse: 88  Temp: 99 F (37.2 C)  TempSrc: Tympanic  Resp: 20      Labs reviewed: Basic Metabolic Panel:  Recent Labs  06/15/13 09/26/13  NA 136* 141  K 3.6 4.0  BUN 19 19  CREATININE 1.5* 1.2*  TSH 0.44 2.68   Liver Function  Tests:  Recent Labs  06/15/13 09/26/13  AST 54* 24  ALT 17 8  ALKPHOS 60 63   No results for input(s): LIPASE, AMYLASE in the last 8760 hours. No results for input(s): AMMONIA in the last 8760 hours. CBC:  Recent Labs  06/15/13 09/26/13  WBC 12.5 7.7  HGB 11.5* 12.3  HCT 34* 37  PLT 224 254   Lipid Panel: No results for input(s): CHOL, HDL, LDLCALC, TRIG, CHOLHDL, LDLDIRECT in the last 8760 hours.  Past Procedures:  NA   Assessment/Plan SCC (squamous cell carcinoma), leg 04/24/14 general surgeon: likely SCC to the left lower leg: non operative treatment. Refer to dermatology to see there is any topical options available. May consider regional block and excision if there are no  topical options.  05/01/14 Dermatology L calf lesion-biopsy. 1120/15: well differentiated invasive Squamous Cell Carcinoma. Mohs vs RT-Dr. Harvel Quale 05/21/14 06/11/14 MOHS 07/02/13   HTN (hypertension) Controlled and takes Amlodipine 10mg  daily   Depression Stable. Takes Celexa to 20mg  and off Remeron 7.5mg  at night.     Hypothyroidism Continue Levothyroxine 160mcg. TSH 2.685 09/26/13    Constipation Stable on MiraLax daily.     Dementia Takes Aricept, requires SNF for care needs.      Family/ Staff Communication: observe the patient  Goals of Care: SNF  Labs/tests ordered: none

## 2014-06-11 NOTE — Assessment & Plan Note (Signed)
Stable on MiraLax daily.  

## 2014-06-11 NOTE — Assessment & Plan Note (Signed)
Controlled and takes Amlodipine 10mg daily   

## 2014-06-11 NOTE — Assessment & Plan Note (Signed)
04/24/14 general surgeon: likely SCC to the left lower leg: non operative treatment. Refer to dermatology to see there is any topical options available. May consider regional block and excision if there are no topical options.  05/01/14 Dermatology L calf lesion-biopsy. 1120/15: well differentiated invasive Squamous Cell Carcinoma. Mohs vs RT-Dr. Harvel Quale 05/21/14 06/11/14 MOHS 07/02/13

## 2014-07-09 ENCOUNTER — Non-Acute Institutional Stay (SKILLED_NURSING_FACILITY): Payer: Medicare Other | Admitting: Nurse Practitioner

## 2014-07-09 DIAGNOSIS — M25512 Pain in left shoulder: Secondary | ICD-10-CM

## 2014-07-09 DIAGNOSIS — E039 Hypothyroidism, unspecified: Secondary | ICD-10-CM

## 2014-07-09 DIAGNOSIS — K5900 Constipation, unspecified: Secondary | ICD-10-CM

## 2014-07-09 DIAGNOSIS — F0390 Unspecified dementia without behavioral disturbance: Secondary | ICD-10-CM

## 2014-07-09 DIAGNOSIS — F32A Depression, unspecified: Secondary | ICD-10-CM

## 2014-07-09 DIAGNOSIS — I10 Essential (primary) hypertension: Secondary | ICD-10-CM

## 2014-07-09 DIAGNOSIS — F329 Major depressive disorder, single episode, unspecified: Secondary | ICD-10-CM

## 2014-07-09 NOTE — Progress Notes (Signed)
Patient ID: Jasmin Rodriguez, female   DOB: November 29, 1913, 81 y.o.   MRN: [id number]   Code Status: DNR  Allergies  Allergen Reactions  . Tramadol   . Zoloft [Sertraline Hcl]     Chief Complaint  Patient presents with  . Medical Management of Chronic Issues  . Acute Visit    left arm pain    HPI: Patient is a 81 y.o. female seen in the SNF at [facility] today for evaluation of new onset left shoulder pain/decreased ROM and chronic medical conditions.  Problem List Items Addressed This Visit    Left shoulder pain - Primary    07/05/14 X-ray cervical spine, L shoulder/elbow: no compression deformities, no acute fracture or dislocation, severe degenerative arthritis.  07/09/14 decreased ROM, pain with PROM, muscle strength 5/5 and equal, will apply Voltaren Gel qid. May consider PT and Ortho.    Hypothyroidism (Chronic)    Continue Levothyroxine 135mcg. TSH 2.685 09/26/13     HTN (hypertension)    Controlled and takes Amlodipine 10mg  daily     Depression    Stable. Takes Celexa to 20mg  and off Remeron 7.5mg  at night.        Dementia    Takes Aricept, requires SNF for care needs.       Constipation    Stable on MiraLax daily.         Review of Systems:  Review of Systems  Constitutional: Negative for fever, chills, malaise/fatigue and diaphoresis.  HENT: Positive for hearing loss. Negative for congestion, ear pain and sore throat.   Eyes: Negative for pain, discharge and redness.  Respiratory: Negative for cough, sputum production, shortness of breath and wheezing.   Cardiovascular: Positive for PND. Negative for chest pain, palpitations, orthopnea, claudication and leg swelling.  Gastrointestinal: Negative for heartburn, nausea, vomiting, abdominal pain, diarrhea, constipation and blood in stool.  Genitourinary: Positive for frequency. Negative for dysuria, urgency, hematuria and flank pain.  Musculoskeletal: Positive for joint pain. Negative for myalgias,  back pain, falls and neck pain.       New onset left shoulder pain and decreased ROM w/o muscle weakness.   Skin: Negative for itching and rash.       LLE calf skin lesion.   Neurological: Negative for dizziness, tingling, tremors, sensory change, speech change, focal weakness, seizures, loss of consciousness, weakness and headaches.  Endo/Heme/Allergies: Negative for environmental allergies and polydipsia. Does not bruise/bleed easily.  Psychiatric/Behavioral: Positive for depression (flat affect) and memory loss. Negative for hallucinations. The patient is not nervous/anxious and does not have insomnia.      Past Medical History  Diagnosis Date  . Hypertension   . Anemia   . Hypothyroidism   . Hyperlipidemia   . Osteopenia   . Radial fracture   . Femur fracture   . Dementia 10/12/2012  . Unstable gait 04/16/2014  . Edema 11/10/2012  . Unspecified constipation 01/02/2013  . Skin cancer 05/01/14    left calf  . Mitral valve prolapse   . Vitamin D deficiency    Past Surgical History  Procedure Laterality Date  . Total hip arthroplasty  2013  . Abdominal hysterectomy    . Skin biopsy Left 05/01/14    Left calf   Social History:   reports that she has never smoked. She has never used smokeless tobacco. She reports that she does not drink alcohol or use illicit drugs.  No family history on file.  Medications: Patient's Medications  New Prescriptions  No medications on file  Previous Medications   AMLODIPINE (NORVASC) 10 MG TABLET    Take 10 mg by mouth every morning.   AMLODIPINE (NORVASC) 10 MG TABLET    Take 10 mg by mouth daily.   CALCIUM CARB-CHOLECALCIFEROL (OYSTER SHELL CALCIUM + D PO)    Take by mouth. 500 mg-200 mg, 1 by mouth twice daily   CHOLECALCIFEROL (VITAMIN D) 1000 UNITS TABLET    Take 1,000 Units by mouth. 2 by mouth daily   CITALOPRAM (CELEXA) 40 MG TABLET    Take 0.5 tablets (20 mg total) by mouth every morning.   DONEPEZIL (ARICEPT) 10 MG TABLET    Take 10  mg by mouth at bedtime.   HYDROCODONE-ACETAMINOPHEN (NORCO/VICODIN) 5-325 MG PER TABLET    Take 1 tablet by mouth every 4 (four) hours as needed for moderate pain.   LEVOTHYROXINE (SYNTHROID, LEVOTHROID) 100 MCG TABLET    Take 100 mcg by mouth daily before breakfast. **Check pulse weekly on Monday**   MIRTAZAPINE (REMERON) 7.5 MG TABLET    Take 7.5 mg by mouth at bedtime.   MULTIPLE VITAMIN (THERA/BETA-CAROTENE) TABS    Take by mouth. 1 by mouth daily   POLYETHYLENE GLYCOL (MIRALAX / GLYCOLAX) PACKET    Take 17 g by mouth. Fill cap to 17 GM mark, mix with 4-8 ounces of fluid and take by mouth every day  Modified Medications   No medications on file  Discontinued Medications   No medications on file     Physical Exam: Physical Exam  Constitutional: She is oriented to person, place, and time.  Frail. Elderly.  HENT:  Head: Normocephalic.  Right Ear: External ear normal.  Left Ear: External ear normal.  Mouth/Throat: No oropharyngeal exudate.  Torus mandibularis.   Eyes: Conjunctivae and EOM are normal. Pupils are equal, round, and reactive to light.  Neck: No JVD present. No tracheal deviation present. No thyromegaly present.  Cardiovascular: Normal rate, regular rhythm, normal heart sounds and intact distal pulses.  Exam reveals no gallop.   No murmur heard. Pulmonary/Chest: No respiratory distress. She has no wheezes. She has rales. She exhibits no tenderness.  Abdominal: She exhibits no distension and no mass. There is no tenderness.  Musculoskeletal: Normal range of motion. She exhibits no edema or tenderness.  Unstable gait. Using walker. Left shoulder pain with decreased ROM  Lymphadenopathy:    She has no cervical adenopathy.  Neurological: She is alert and oriented to person, place, and time. No cranial nerve deficit.  Skin: No rash noted. No erythema. No pallor.  1.5 by 1.0 lesionoof the left lower leg medially in the calf area.  Psychiatric: She has a normal mood and  affect. Her speech is normal and behavior is normal. Judgment and thought content normal. Cognition and memory are impaired. She exhibits abnormal recent memory and abnormal remote memory.    Filed Vitals:   07/09/14 0729  BP: 130/80  Pulse: 84  Temp: 98.4 F (36.9 C)  TempSrc: Tympanic  Resp: 20      Labs reviewed: Basic Metabolic Panel:  Recent Labs  09/26/13  NA 141  K 4.0  BUN 19  CREATININE 1.2*  TSH 2.68   Liver Function Tests:  Recent Labs  09/26/13  AST 24  ALT 8  ALKPHOS 63   No results for input(s): LIPASE, AMYLASE in the last 8760 hours. No results for input(s): AMMONIA in the last 8760 hours. CBC:  Recent Labs  09/26/13  WBC 7.7  HGB  12.3  HCT 37  PLT 254   Lipid Panel: No results for input(s): CHOL, HDL, LDLCALC, TRIG, CHOLHDL, LDLDIRECT in the last 8760 hours.  Past Procedures:  NA   Assessment/Plan Left shoulder pain 07/05/14 X-ray cervical spine, L shoulder/elbow: no compression deformities, no acute fracture or dislocation, severe degenerative arthritis.  07/09/14 decreased ROM, pain with PROM, muscle strength 5/5 and equal, will apply Voltaren Gel qid. May consider PT and Ortho.  Constipation Stable on MiraLax daily.    Dementia Takes Aricept, requires SNF for care needs.     Depression Stable. Takes Celexa to 20mg  and off Remeron 7.5mg  at night.      HTN (hypertension) Controlled and takes Amlodipine 10mg  daily   Hypothyroidism Continue Levothyroxine 123mcg. TSH 2.685 09/26/13     Family/ Staff Communication: observe the patient  Goals of Care: SNF  Labs/tests ordered: none

## 2014-07-09 NOTE — Assessment & Plan Note (Signed)
Stable. Takes Celexa to 20mg  and off Remeron 7.5mg  at night.

## 2014-07-09 NOTE — Assessment & Plan Note (Signed)
Controlled and takes Amlodipine 10mg  daily

## 2014-07-09 NOTE — Assessment & Plan Note (Signed)
Continue Levothyroxine 134mcg. TSH 2.685 09/26/13

## 2014-07-09 NOTE — Assessment & Plan Note (Signed)
07/05/14 X-ray cervical spine, L shoulder/elbow: no compression deformities, no acute fracture or dislocation, severe degenerative arthritis.  07/09/14 decreased ROM, pain with PROM, muscle strength 5/5 and equal, will apply Voltaren Gel qid. May consider PT and Ortho.

## 2014-07-09 NOTE — Assessment & Plan Note (Signed)
Takes Aricept, requires SNF for care needs.

## 2014-07-09 NOTE — Assessment & Plan Note (Signed)
Stable on MiraLax daily.  

## 2014-08-02 ENCOUNTER — Non-Acute Institutional Stay (SKILLED_NURSING_FACILITY): Payer: Medicare Other | Admitting: Nurse Practitioner

## 2014-08-02 ENCOUNTER — Encounter: Payer: Self-pay | Admitting: Nurse Practitioner

## 2014-08-02 DIAGNOSIS — F32A Depression, unspecified: Secondary | ICD-10-CM

## 2014-08-02 DIAGNOSIS — R609 Edema, unspecified: Secondary | ICD-10-CM

## 2014-08-02 DIAGNOSIS — F0391 Unspecified dementia with behavioral disturbance: Secondary | ICD-10-CM

## 2014-08-02 DIAGNOSIS — I10 Essential (primary) hypertension: Secondary | ICD-10-CM

## 2014-08-02 DIAGNOSIS — E039 Hypothyroidism, unspecified: Secondary | ICD-10-CM

## 2014-08-02 DIAGNOSIS — K5900 Constipation, unspecified: Secondary | ICD-10-CM

## 2014-08-02 DIAGNOSIS — F329 Major depressive disorder, single episode, unspecified: Secondary | ICD-10-CM

## 2014-08-02 NOTE — Assessment & Plan Note (Signed)
Takes Aricept, requires SNF for care needs. Progressing gradually-difficulty of showering-Lorazepam prn prior to shower needed.

## 2014-08-02 NOTE — Assessment & Plan Note (Signed)
Continue Levothyroxine 124mcg. TSH 2.685 09/26/13

## 2014-08-02 NOTE — Progress Notes (Signed)
Patient ID: Jasmin Rodriguez, female   DOB: 12/31/1913, 81 y.o.   MRN: [id number]   Code Status: DNR  Allergies  Allergen Reactions  . Tramadol   . Zoloft [Sertraline Hcl]     Chief Complaint  Patient presents with  . Medical Management of Chronic Issues    HPI: Patient is a 81 y.o. female seen in the SNF at [facility] today for evaluation of chronic medical conditions.  Problem List Items Addressed This Visit    Hypothyroidism (Chronic)    Continue Levothyroxine 146mcg. TSH 2.685 09/26/13       HTN (hypertension) - Primary    Controlled and takes Amlodipine 10mg  daily        Edema    Only trace in ankles/feet.       Depression    Stable. Takes Celexa to 20mg  and off Remeron 7.5mg  at night.         Dementia    Takes Aricept, requires SNF for care needs. Progressing gradually-difficulty of showering-Lorazepam prn prior to shower needed.       Constipation    Stable on MiraLax daily.            Review of Systems:  Review of Systems  Constitutional: Negative for fever, chills, weight loss, malaise/fatigue and diaphoresis.  HENT: Positive for hearing loss. Negative for congestion, ear discharge, ear pain, nosebleeds, sore throat and tinnitus.   Eyes: Negative for blurred vision, double vision, photophobia, pain, discharge and redness.  Respiratory: Negative for cough, hemoptysis, sputum production, shortness of breath, wheezing and stridor.   Cardiovascular: Positive for leg swelling. Negative for chest pain, palpitations, orthopnea, claudication and PND.       Trace  Gastrointestinal: Negative for heartburn, nausea, vomiting, abdominal pain, diarrhea, constipation, blood in stool and melena.  Genitourinary: Positive for frequency. Negative for dysuria, urgency, hematuria and flank pain.  Musculoskeletal: Positive for joint pain. Negative for myalgias, back pain, falls and neck pain.  Skin: Negative for itching and rash.  Neurological: Negative  for dizziness, tingling, tremors, sensory change, speech change, focal weakness, seizures, loss of consciousness, weakness and headaches.  Endo/Heme/Allergies: Negative for environmental allergies and polydipsia. Does not bruise/bleed easily.  Psychiatric/Behavioral: Positive for depression and memory loss. Negative for suicidal ideas, hallucinations and substance abuse. The patient is not nervous/anxious and does not have insomnia.      Past Medical History  Diagnosis Date  . Hypertension   . Anemia   . Hypothyroidism   . Hyperlipidemia   . Osteopenia   . Radial fracture   . Femur fracture   . Dementia 10/12/2012  . Unstable gait 04/16/2014  . Edema 11/10/2012  . Unspecified constipation 01/02/2013  . Skin cancer 05/01/14    left calf  . Mitral valve prolapse   . Vitamin D deficiency    Past Surgical History  Procedure Laterality Date  . Total hip arthroplasty  2013  . Abdominal hysterectomy    . Skin biopsy Left 05/01/14    Left calf   Social History:   reports that she has never smoked. She has never used smokeless tobacco. She reports that she does not drink alcohol or use illicit drugs.  No family history on file.  Medications: Patient's Medications  New Prescriptions   No medications on file  Previous Medications   AMLODIPINE (NORVASC) 10 MG TABLET    Take 10 mg by mouth every morning.   AMLODIPINE (NORVASC) 10 MG TABLET    Take 10 mg by  mouth daily.   CALCIUM CARB-CHOLECALCIFEROL (OYSTER SHELL CALCIUM + D PO)    Take by mouth. 500 mg-200 mg, 1 by mouth twice daily   CHOLECALCIFEROL (VITAMIN D) 1000 UNITS TABLET    Take 1,000 Units by mouth. 2 by mouth daily   CITALOPRAM (CELEXA) 40 MG TABLET    Take 0.5 tablets (20 mg total) by mouth every morning.   DONEPEZIL (ARICEPT) 10 MG TABLET    Take 10 mg by mouth at bedtime.   HYDROCODONE-ACETAMINOPHEN (NORCO/VICODIN) 5-325 MG PER TABLET    Take 1 tablet by mouth every 4 (four) hours as needed for moderate pain.    LEVOTHYROXINE (SYNTHROID, LEVOTHROID) 100 MCG TABLET    Take 100 mcg by mouth daily before breakfast. **Check pulse weekly on Monday**   MIRTAZAPINE (REMERON) 7.5 MG TABLET    Take 7.5 mg by mouth at bedtime.   MULTIPLE VITAMIN (THERA/BETA-CAROTENE) TABS    Take by mouth. 1 by mouth daily   POLYETHYLENE GLYCOL (MIRALAX / GLYCOLAX) PACKET    Take 17 g by mouth. Fill cap to 17 GM mark, mix with 4-8 ounces of fluid and take by mouth every day  Modified Medications   No medications on file  Discontinued Medications   No medications on file     Physical Exam: Physical Exam  Constitutional: She is oriented to person, place, and time.  Frail. Elderly.  HENT:  Head: Normocephalic.  Right Ear: External ear normal.  Left Ear: External ear normal.  Mouth/Throat: No oropharyngeal exudate.  Torus mandibularis.   Eyes: Conjunctivae and EOM are normal. Pupils are equal, round, and reactive to light.  Neck: No JVD present. No tracheal deviation present. No thyromegaly present.  Cardiovascular: Normal rate, regular rhythm, normal heart sounds and intact distal pulses.  Exam reveals no gallop.   No murmur heard. Pulmonary/Chest: No respiratory distress. She has no wheezes. She has rales. She exhibits no tenderness.  Abdominal: She exhibits no distension and no mass. There is no tenderness.  Musculoskeletal: Normal range of motion. She exhibits no edema or tenderness.  Unstable gait. Using walker. Left shoulder pain with decreased ROM  Lymphadenopathy:    She has no cervical adenopathy.  Neurological: She is alert and oriented to person, place, and time. No cranial nerve deficit.  Skin: No rash noted. No erythema. No pallor.  Psychiatric: She has a normal mood and affect. Her speech is normal and behavior is normal. Judgment and thought content normal. Cognition and memory are impaired. She exhibits abnormal recent memory and abnormal remote memory.    Filed Vitals:   08/02/14 1410  BP: 138/76    Pulse: 66  Temp: 97.6 F (36.4 C)  TempSrc: Tympanic  Resp: 16      Labs reviewed: Basic Metabolic Panel:  Recent Labs  09/26/13  NA 141  K 4.0  BUN 19  CREATININE 1.2*  TSH 2.68   Liver Function Tests:  Recent Labs  09/26/13  AST 24  ALT 8  ALKPHOS 63   No results for input(s): LIPASE, AMYLASE in the last 8760 hours. No results for input(s): AMMONIA in the last 8760 hours. CBC:  Recent Labs  09/26/13  WBC 7.7  HGB 12.3  HCT 37  PLT 254   Lipid Panel: No results for input(s): CHOL, HDL, LDLCALC, TRIG, CHOLHDL, LDLDIRECT in the last 8760 hours.  Past Procedures:  NA   Assessment/Plan HTN (hypertension) Controlled and takes Amlodipine 10mg  daily     Depression Stable. Takes Celexa to  20mg  and off Remeron 7.5mg  at night.      Dementia Takes Aricept, requires SNF for care needs. Progressing gradually-difficulty of showering-Lorazepam prn prior to shower needed.    Hypothyroidism Continue Levothyroxine 184mcg. TSH 2.685 09/26/13    Constipation Stable on MiraLax daily.      Edema Only trace in ankles/feet.      Family/ Staff Communication: observe the patient  Goals of Care: SNF  Labs/tests ordered: none

## 2014-08-02 NOTE — Assessment & Plan Note (Signed)
Controlled and takes Amlodipine 10mg  daily

## 2014-08-02 NOTE — Assessment & Plan Note (Signed)
Only trace in ankles/feet.

## 2014-08-02 NOTE — Assessment & Plan Note (Signed)
Stable. Takes Celexa to 20mg  and off Remeron 7.5mg  at night.

## 2014-08-02 NOTE — Assessment & Plan Note (Signed)
Stable on MiraLax daily.  

## 2014-08-30 ENCOUNTER — Non-Acute Institutional Stay (SKILLED_NURSING_FACILITY): Payer: Medicare Other | Admitting: Nurse Practitioner

## 2014-08-30 ENCOUNTER — Encounter: Payer: Self-pay | Admitting: Nurse Practitioner

## 2014-08-30 DIAGNOSIS — M25512 Pain in left shoulder: Secondary | ICD-10-CM | POA: Diagnosis not present

## 2014-08-30 DIAGNOSIS — K5900 Constipation, unspecified: Secondary | ICD-10-CM | POA: Diagnosis not present

## 2014-08-30 DIAGNOSIS — R609 Edema, unspecified: Secondary | ICD-10-CM

## 2014-08-30 DIAGNOSIS — I10 Essential (primary) hypertension: Secondary | ICD-10-CM

## 2014-08-30 DIAGNOSIS — F0390 Unspecified dementia without behavioral disturbance: Secondary | ICD-10-CM | POA: Diagnosis not present

## 2014-08-30 DIAGNOSIS — F329 Major depressive disorder, single episode, unspecified: Secondary | ICD-10-CM

## 2014-08-30 DIAGNOSIS — E039 Hypothyroidism, unspecified: Secondary | ICD-10-CM

## 2014-08-30 DIAGNOSIS — F32A Depression, unspecified: Secondary | ICD-10-CM

## 2014-08-30 NOTE — Progress Notes (Signed)
Patient ID: Jasmin Rodriguez, female   DOB: [birth year], 81 y.o.   MRN: [id number]   Code Status: DNR  Allergies  Allergen Reactions  . Tramadol   . Zoloft [Sertraline Hcl]     Chief Complaint  Patient presents with  . Medical Management of Chronic Issues    HPI: Patient is a 81 y.o. female seen in the SNF at [facility] today for evaluation of chronic medical conditions.  Problem List Items Addressed This Visit    Left shoulder pain - Primary    07/05/14 X-ray cervical spine, L shoulder/elbow: no compression deformities, no acute fracture or dislocation, severe degenerative arthritis.  07/09/14 decreased ROM, pain with PROM, muscle strength 5/5 and equal, will apply Voltaren Gel qid. May consider PT and Ortho. 08/30/14 no change       Hypothyroidism (Chronic)    Continue Levothyroxine 111mcg. TSH 2.685 09/26/13. Update TSH        HTN (hypertension)    Controlled and takes Amlodipine 10mg  daily-update CMP      Edema    Only trace in ankles/feet.       Depression    Stable. Takes Celexa to 20mg  and off Remeron 7.5mg  at night.        Dementia    Takes Aricept, requires SNF for care needs. Progressing gradually-difficulty of showering-Lorazepam prn prior to shower needed.        Constipation    Stable on MiraLax daily.            Review of Systems:  Review of Systems  Constitutional: Negative for fever, chills, weight loss, malaise/fatigue and diaphoresis.  HENT: Positive for hearing loss. Negative for congestion, ear discharge, ear pain, nosebleeds, sore throat and tinnitus.   Eyes: Negative for blurred vision, double vision, photophobia, pain, discharge and redness.  Respiratory: Negative for cough, hemoptysis, sputum production, shortness of breath, wheezing and stridor.   Cardiovascular: Positive for leg swelling. Negative for chest pain, palpitations, orthopnea, claudication and PND.       Trace only in BLE  Gastrointestinal: Negative for  heartburn, nausea, vomiting, abdominal pain, diarrhea, constipation, blood in stool and melena.  Genitourinary: Positive for frequency. Negative for dysuria, urgency, hematuria and flank pain.  Musculoskeletal: Positive for joint pain. Negative for myalgias, back pain, falls and neck pain.  Skin: Negative for itching and rash.  Neurological: Negative for dizziness, tingling, tremors, sensory change, speech change, focal weakness, seizures, loss of consciousness, weakness and headaches.  Endo/Heme/Allergies: Negative for environmental allergies and polydipsia. Does not bruise/bleed easily.  Psychiatric/Behavioral: Positive for memory loss. Negative for depression, suicidal ideas, hallucinations and substance abuse. The patient is not nervous/anxious and does not have insomnia.      Past Medical History  Diagnosis Date  . Hypertension   . Anemia   . Hypothyroidism   . Hyperlipidemia   . Osteopenia   . Radial fracture   . Femur fracture   . Dementia 10/12/2012  . Unstable gait 04/16/2014  . Edema 11/10/2012  . Unspecified constipation 01/02/2013  . Skin cancer 05/01/14    left calf  . Mitral valve prolapse   . Vitamin D deficiency    Past Surgical History  Procedure Laterality Date  . Total hip arthroplasty  2013  . Abdominal hysterectomy    . Skin biopsy Left 05/01/14    Left calf   Social History:   reports that she has never smoked. She has never used smokeless tobacco. She reports that she does not drink  alcohol or use illicit drugs.  No family history on file.  Medications: Patient's Medications  New Prescriptions   No medications on file  Previous Medications   AMLODIPINE (NORVASC) 10 MG TABLET    Take 10 mg by mouth every morning.   AMLODIPINE (NORVASC) 10 MG TABLET    Take 10 mg by mouth daily.   CALCIUM CARB-CHOLECALCIFEROL (OYSTER SHELL CALCIUM + D PO)    Take by mouth. 500 mg-200 mg, 1 by mouth twice daily   CHOLECALCIFEROL (VITAMIN D) 1000 UNITS TABLET    Take 1,000  Units by mouth. 2 by mouth daily   CITALOPRAM (CELEXA) 40 MG TABLET    Take 0.5 tablets (20 mg total) by mouth every morning.   DONEPEZIL (ARICEPT) 10 MG TABLET    Take 10 mg by mouth at bedtime.   HYDROCODONE-ACETAMINOPHEN (NORCO/VICODIN) 5-325 MG PER TABLET    Take 1 tablet by mouth every 4 (four) hours as needed for moderate pain.   LEVOTHYROXINE (SYNTHROID, LEVOTHROID) 100 MCG TABLET    Take 100 mcg by mouth daily before breakfast. **Check pulse weekly on Monday**   MIRTAZAPINE (REMERON) 7.5 MG TABLET    Take 7.5 mg by mouth at bedtime.   MULTIPLE VITAMIN (THERA/BETA-CAROTENE) TABS    Take by mouth. 1 by mouth daily   POLYETHYLENE GLYCOL (MIRALAX / GLYCOLAX) PACKET    Take 17 g by mouth. Fill cap to 17 GM mark, mix with 4-8 ounces of fluid and take by mouth every day  Modified Medications   No medications on file  Discontinued Medications   No medications on file     Physical Exam: Physical Exam  Constitutional: She is oriented to person, place, and time.  Frail. Elderly.  HENT:  Head: Normocephalic.  Right Ear: External ear normal.  Left Ear: External ear normal.  Mouth/Throat: No oropharyngeal exudate.  Torus mandibularis.   Eyes: Conjunctivae and EOM are normal. Pupils are equal, round, and reactive to light.  Neck: No JVD present. No tracheal deviation present. No thyromegaly present.  Cardiovascular: Normal rate, regular rhythm, normal heart sounds and intact distal pulses.  Exam reveals no gallop.   No murmur heard. Pulmonary/Chest: No respiratory distress. She has no wheezes. She has rales. She exhibits no tenderness.  Abdominal: She exhibits no distension and no mass. There is no tenderness.  Musculoskeletal: Normal range of motion. She exhibits edema and tenderness.  Unstable gait. Using walker. Left shoulder pain with decreased ROM. Trace edema in ankles.   Lymphadenopathy:    She has no cervical adenopathy.  Neurological: She is alert and oriented to person, place,  and time. No cranial nerve deficit.  Skin: No rash noted. No erythema. No pallor.  Psychiatric: She has a normal mood and affect. Her speech is normal and behavior is normal. Judgment and thought content normal. Cognition and memory are impaired. She exhibits abnormal recent memory and abnormal remote memory.    Filed Vitals:   08/30/14 1459  BP: 130/70  Pulse: 74  Temp: 98 F (36.7 C)  TempSrc: Tympanic  Resp: 18      Labs reviewed: Basic Metabolic Panel:  Recent Labs  09/26/13  NA 141  K 4.0  BUN 19  CREATININE 1.2*  TSH 2.68   Liver Function Tests:  Recent Labs  09/26/13  AST 24  ALT 8  ALKPHOS 63   No results for input(s): LIPASE, AMYLASE in the last 8760 hours. No results for input(s): AMMONIA in the last 8760 hours. CBC:  Recent Labs  09/26/13  WBC 7.7  HGB 12.3  HCT 37  PLT 254   Lipid Panel: No results for input(s): CHOL, HDL, LDLCALC, TRIG, CHOLHDL, LDLDIRECT in the last 8760 hours.  Past Procedures:  NA   Assessment/Plan Left shoulder pain 07/05/14 X-ray cervical spine, L shoulder/elbow: no compression deformities, no acute fracture or dislocation, severe degenerative arthritis.  07/09/14 decreased ROM, pain with PROM, muscle strength 5/5 and equal, will apply Voltaren Gel qid. May consider PT and Ortho. 08/30/14 no change    Hypothyroidism Continue Levothyroxine 176mcg. TSH 2.685 09/26/13. Update TSH     HTN (hypertension) Controlled and takes Amlodipine 10mg  daily-update CMP   Constipation Stable on MiraLax daily.      Dementia Takes Aricept, requires SNF for care needs. Progressing gradually-difficulty of showering-Lorazepam prn prior to shower needed.     Depression Stable. Takes Celexa to 20mg  and off Remeron 7.5mg  at night.     Edema Only trace in ankles/feet.      Family/ Staff Communication: observe the patient  Goals of Care: SNF  Labs/tests ordered: CBC, CMP, TSH

## 2014-08-30 NOTE — Assessment & Plan Note (Signed)
Controlled and takes Amlodipine 10mg  daily-update CMP

## 2014-08-30 NOTE — Assessment & Plan Note (Signed)
Takes Aricept, requires SNF for care needs. Progressing gradually-difficulty of showering-Lorazepam prn prior to shower needed.

## 2014-08-30 NOTE — Assessment & Plan Note (Signed)
Stable on MiraLax daily.  

## 2014-08-30 NOTE — Assessment & Plan Note (Signed)
Continue Levothyroxine 125mcg. TSH 2.685 09/26/13. Update TSH

## 2014-08-30 NOTE — Assessment & Plan Note (Signed)
Only trace in ankles/feet.

## 2014-08-30 NOTE — Assessment & Plan Note (Signed)
Stable. Takes Celexa to 20mg  and off Remeron 7.5mg  at night.

## 2014-08-30 NOTE — Assessment & Plan Note (Signed)
07/05/14 X-ray cervical spine, L shoulder/elbow: no compression deformities, no acute fracture or dislocation, severe degenerative arthritis.  07/09/14 decreased ROM, pain with PROM, muscle strength 5/5 and equal, will apply Voltaren Gel qid. May consider PT and Ortho. 08/30/14 no change

## 2014-09-04 LAB — HEPATIC FUNCTION PANEL
ALT: 8 U/L (ref 7–35)
AST: 17 U/L (ref 13–35)
Alkaline Phosphatase: 55 U/L (ref 25–125)
Bilirubin, Total: 0.6 mg/dL

## 2014-09-04 LAB — BASIC METABOLIC PANEL
BUN: 17 mg/dL (ref 4–21)
Creatinine: 1.1 mg/dL (ref 0.5–1.1)
Glucose: 75 mg/dL
POTASSIUM: 3.6 mmol/L (ref 3.4–5.3)
Sodium: 139 mmol/L (ref 137–147)

## 2014-09-04 LAB — CBC AND DIFFERENTIAL
HEMATOCRIT: 36 % (ref 36–46)
Hemoglobin: 11.5 g/dL — AB (ref 12.0–16.0)
Platelets: 222 10*3/uL (ref 150–399)
WBC: 3.9 10*3/mL

## 2014-09-04 LAB — TSH: TSH: 4.89 u[IU]/mL (ref 0.41–5.90)

## 2014-09-05 ENCOUNTER — Other Ambulatory Visit: Payer: Self-pay | Admitting: Nurse Practitioner

## 2014-09-05 DIAGNOSIS — E039 Hypothyroidism, unspecified: Secondary | ICD-10-CM

## 2014-10-01 ENCOUNTER — Encounter: Payer: Self-pay | Admitting: Nurse Practitioner

## 2014-10-01 ENCOUNTER — Non-Acute Institutional Stay (SKILLED_NURSING_FACILITY): Payer: Medicare Other | Admitting: Nurse Practitioner

## 2014-10-01 DIAGNOSIS — I10 Essential (primary) hypertension: Secondary | ICD-10-CM

## 2014-10-01 DIAGNOSIS — F329 Major depressive disorder, single episode, unspecified: Secondary | ICD-10-CM | POA: Diagnosis not present

## 2014-10-01 DIAGNOSIS — F32A Depression, unspecified: Secondary | ICD-10-CM

## 2014-10-01 DIAGNOSIS — M25512 Pain in left shoulder: Secondary | ICD-10-CM

## 2014-10-01 DIAGNOSIS — K5900 Constipation, unspecified: Secondary | ICD-10-CM | POA: Diagnosis not present

## 2014-10-01 DIAGNOSIS — F0390 Unspecified dementia without behavioral disturbance: Secondary | ICD-10-CM | POA: Diagnosis not present

## 2014-10-01 DIAGNOSIS — E039 Hypothyroidism, unspecified: Secondary | ICD-10-CM

## 2014-10-01 DIAGNOSIS — R609 Edema, unspecified: Secondary | ICD-10-CM

## 2014-10-01 NOTE — Assessment & Plan Note (Signed)
07/05/14 X-ray cervical spine, L shoulder/elbow: no compression deformities, no acute fracture or dislocation, severe degenerative arthritis.  07/09/14 decreased ROM, pain with PROM, muscle strength 5/5 and equal, will apply Voltaren Gel qid. May consider PT and Ortho. 08/30/14 no change

## 2014-10-01 NOTE — Assessment & Plan Note (Signed)
Takes Aricept, requires SNF for care needs. Progressing gradually-difficulty of showering-Lorazepam prn prior to shower needed.

## 2014-10-01 NOTE — Assessment & Plan Note (Signed)
Stable on MiraLax daily.  

## 2014-10-01 NOTE — Assessment & Plan Note (Signed)
Only trace in ankles/feet.

## 2014-10-01 NOTE — Assessment & Plan Note (Signed)
Continue Levothyroxine 136mcg. TSH 2.685 09/26/13 4.893 09/04/14

## 2014-10-01 NOTE — Assessment & Plan Note (Signed)
Stable. Takes Celexa to 20mg  and off Remeron 7.5mg  at night.

## 2014-10-01 NOTE — Progress Notes (Signed)
Patient ID: Jasmin Rodriguez, female   DOB: 06-22-1914, 81 y.o.   MRN: [id number]   Code Status: DNR  Allergies  Allergen Reactions  . Tramadol   . Zoloft [Sertraline Hcl]     Chief Complaint  Patient presents with  . Medical Management of Chronic Issues    HPI: Patient is a 81 y.o. female seen in the SNF at [facility] today for evaluation of chronic medical conditions.  Problem List Items Addressed This Visit    Hypothyroidism - Primary (Chronic)    Continue Levothyroxine 175mcg. TSH 2.685 09/26/13 4.893 09/04/14       HTN (hypertension)    Controlled and takes Amlodipine 10mg  daily      Dementia    Takes Aricept, requires SNF for care needs. Progressing gradually-difficulty of showering-Lorazepam prn prior to shower needed.       Depression    Stable. Takes Celexa to 20mg  and off Remeron 7.5mg  at night.        Edema    Only trace in ankles/feet.        Constipation    Stable on MiraLax daily.        Left shoulder pain    07/05/14 X-ray cervical spine, L shoulder/elbow: no compression deformities, no acute fracture or dislocation, severe degenerative arthritis.  07/09/14 decreased ROM, pain with PROM, muscle strength 5/5 and equal, will apply Voltaren Gel qid. May consider PT and Ortho. 08/30/14 no change         Review of Systems:  Review of Systems  Constitutional: Negative for fever, chills and diaphoresis.  HENT: Positive for hearing loss. Negative for congestion, ear discharge, ear pain, nosebleeds, sore throat and tinnitus.   Eyes: Negative for photophobia, pain, discharge and redness.  Respiratory: Negative for cough, shortness of breath, wheezing and stridor.   Cardiovascular: Positive for leg swelling. Negative for chest pain and palpitations.       Trace only in BLE  Gastrointestinal: Negative for nausea, vomiting, abdominal pain, diarrhea, constipation and blood in stool.  Endocrine: Negative for polydipsia.  Genitourinary: Positive for  frequency. Negative for dysuria, urgency, hematuria and flank pain.  Musculoskeletal: Negative for myalgias, back pain and neck pain.  Skin: Negative for rash.  Allergic/Immunologic: Negative for environmental allergies.  Neurological: Negative for dizziness, tremors, seizures, weakness and headaches.  Hematological: Does not bruise/bleed easily.  Psychiatric/Behavioral: Negative for suicidal ideas and hallucinations. The patient is not nervous/anxious.      Past Medical History  Diagnosis Date  . Hypertension   . Anemia   . Hypothyroidism   . Hyperlipidemia   . Osteopenia   . Radial fracture   . Femur fracture   . Dementia 10/12/2012  . Unstable gait 04/16/2014  . Edema 11/10/2012  . Unspecified constipation 01/02/2013  . Skin cancer 05/01/14    left calf  . Mitral valve prolapse   . Vitamin D deficiency    Past Surgical History  Procedure Laterality Date  . Total hip arthroplasty  2013  . Abdominal hysterectomy    . Skin biopsy Left 05/01/14    Left calf   Social History:   reports that she has never smoked. She has never used smokeless tobacco. She reports that she does not drink alcohol or use illicit drugs.  No family history on file.  Medications: Patient's Medications  New Prescriptions   No medications on file  Previous Medications   AMLODIPINE (NORVASC) 10 MG TABLET    Take 10 mg by mouth every  morning.   AMLODIPINE (NORVASC) 10 MG TABLET    Take 10 mg by mouth daily.   CALCIUM CARB-CHOLECALCIFEROL (OYSTER SHELL CALCIUM + D PO)    Take by mouth. 500 mg-200 mg, 1 by mouth twice daily   CHOLECALCIFEROL (VITAMIN D) 1000 UNITS TABLET    Take 1,000 Units by mouth. 2 by mouth daily   CITALOPRAM (CELEXA) 40 MG TABLET    Take 0.5 tablets (20 mg total) by mouth every morning.   DONEPEZIL (ARICEPT) 10 MG TABLET    Take 10 mg by mouth at bedtime.   HYDROCODONE-ACETAMINOPHEN (NORCO/VICODIN) 5-325 MG PER TABLET    Take 1 tablet by mouth every 4 (four) hours as needed for  moderate pain.   LEVOTHYROXINE (SYNTHROID, LEVOTHROID) 100 MCG TABLET    Take 100 mcg by mouth daily before breakfast. **Check pulse weekly on Monday**   MIRTAZAPINE (REMERON) 7.5 MG TABLET    Take 7.5 mg by mouth at bedtime.   MULTIPLE VITAMIN (THERA/BETA-CAROTENE) TABS    Take by mouth. 1 by mouth daily   POLYETHYLENE GLYCOL (MIRALAX / GLYCOLAX) PACKET    Take 17 g by mouth. Fill cap to 17 GM mark, mix with 4-8 ounces of fluid and take by mouth every day  Modified Medications   No medications on file  Discontinued Medications   No medications on file     Physical Exam: Physical Exam  Constitutional: She is oriented to person, place, and time.  Frail. Elderly.  HENT:  Head: Normocephalic.  Right Ear: External ear normal.  Left Ear: External ear normal.  Mouth/Throat: No oropharyngeal exudate.  Torus mandibularis.   Eyes: Conjunctivae and EOM are normal. Pupils are equal, round, and reactive to light.  Neck: No JVD present. No tracheal deviation present. No thyromegaly present.  Cardiovascular: Normal rate, regular rhythm, normal heart sounds and intact distal pulses.  Exam reveals no gallop.   No murmur heard. Pulmonary/Chest: No respiratory distress. She has no wheezes. She has rales. She exhibits no tenderness.  Abdominal: She exhibits no distension and no mass. There is no tenderness.  Musculoskeletal: Normal range of motion. She exhibits edema and tenderness.  Unstable gait. Using walker. Left shoulder pain with decreased ROM. Trace edema in ankles.   Lymphadenopathy:    She has no cervical adenopathy.  Neurological: She is alert and oriented to person, place, and time. No cranial nerve deficit.  Skin: No rash noted. No erythema. No pallor.  Psychiatric: She has a normal mood and affect. Her speech is normal and behavior is normal. Judgment and thought content normal. Cognition and memory are impaired. She exhibits abnormal recent memory and abnormal remote memory.    Filed  Vitals:   10/01/14 1723  BP: 128/64  Pulse: 80  Temp: 98 F (36.7 C)  TempSrc: Tympanic  Resp: 14      Labs reviewed: Basic Metabolic Panel:  Recent Labs  09/04/14  NA 139  K 3.6  BUN 17  CREATININE 1.1  TSH 4.89   Liver Function Tests:  Recent Labs  09/04/14  AST 17  ALT 8  ALKPHOS 55   No results for input(s): LIPASE, AMYLASE in the last 8760 hours. No results for input(s): AMMONIA in the last 8760 hours. CBC:  Recent Labs  09/04/14  WBC 3.9  HGB 11.5*  HCT 36  PLT 222   Lipid Panel: No results for input(s): CHOL, HDL, LDLCALC, TRIG, CHOLHDL, LDLDIRECT in the last 8760 hours.  Past Procedures:  n/a  Assessment/Plan  Hypothyroidism Continue Levothyroxine 152mcg. TSH 2.685 09/26/13 4.893 09/04/14    HTN (hypertension) Controlled and takes Amlodipine 10mg  daily   Dementia Takes Aricept, requires SNF for care needs. Progressing gradually-difficulty of showering-Lorazepam prn prior to shower needed.    Depression Stable. Takes Celexa to 20mg  and off Remeron 7.5mg  at night.     Edema Only trace in ankles/feet.     Constipation Stable on MiraLax daily.     Left shoulder pain 07/05/14 X-ray cervical spine, L shoulder/elbow: no compression deformities, no acute fracture or dislocation, severe degenerative arthritis.  07/09/14 decreased ROM, pain with PROM, muscle strength 5/5 and equal, will apply Voltaren Gel qid. May consider PT and Ortho. 08/30/14 no change     Family/ Staff Communication: observe the patient  Goals of Care: SNF  Labs/tests ordered: none

## 2014-10-01 NOTE — Assessment & Plan Note (Signed)
Controlled and takes Amlodipine 10mg  daily

## 2014-10-15 ENCOUNTER — Other Ambulatory Visit: Payer: Self-pay | Admitting: Nurse Practitioner

## 2014-11-01 ENCOUNTER — Encounter: Payer: Self-pay | Admitting: Internal Medicine

## 2014-11-01 ENCOUNTER — Non-Acute Institutional Stay (SKILLED_NURSING_FACILITY): Payer: Medicare Other | Admitting: Internal Medicine

## 2014-11-01 DIAGNOSIS — F0390 Unspecified dementia without behavioral disturbance: Secondary | ICD-10-CM

## 2014-11-01 DIAGNOSIS — I10 Essential (primary) hypertension: Secondary | ICD-10-CM | POA: Diagnosis not present

## 2014-11-01 DIAGNOSIS — E039 Hypothyroidism, unspecified: Secondary | ICD-10-CM

## 2014-11-01 DIAGNOSIS — R609 Edema, unspecified: Secondary | ICD-10-CM

## 2014-11-01 DIAGNOSIS — R2681 Unsteadiness on feet: Secondary | ICD-10-CM

## 2014-11-01 DIAGNOSIS — C44729 Squamous cell carcinoma of skin of left lower limb, including hip: Secondary | ICD-10-CM

## 2014-11-01 NOTE — Progress Notes (Signed)
Patient ID: Jasmin Rodriguez, female   DOB: 05/24/1914, 81 y.o.   MRN: [id number]    HISTORY AND PHYSICAL  Location:  [facility] Room Number: 2B Place of Service: SNF (31)   Extended Emergency Contact Information Primary Emergency Contact: [place] Phone: [phone] Relation: None Secondary Emergency Contact: Cappellari,James          [place] [address] [place] Phone: [phone] Relation: None  Advanced Directive information Does patient have an advance directive?: Yes, Type of Advance Directive: Healthcare Power of [place];Living will;Out of facility DNR (pink MOST or yellow form), Pre-existing out of facility DNR order (yellow form or pink MOST form): Yellow form placed in chart (order not valid for inpatient use), Does patient want to make changes to advanced directive?: No - Patient declined  Chief Complaint  Patient presents with  . Annual Exam  . Medical Management of Chronic Issues    HPI:  Dementia, without behavioral disturbance: Progressive dementia with loss of functional abilities. Now dependent on staff for all ADL.  Essential hypertension: Controlled  Hypothyroidism, unspecified hypothyroidism type: Compensated  Unstable gait: High fall risk  Edema: Improved  SCC (squamous cell carcinoma), leg, left: Removed by Dr. Harvel Quale with Mohs surgery. Healed. No evidence of relapse   Past Medical History  Diagnosis Date  . Hypertension   . Anemia   . Hypothyroidism   . Hyperlipidemia   . Osteopenia   . Radial fracture   . Femur fracture   . Dementia 10/12/2012  . Unstable gait 04/16/2014  . Edema 11/10/2012  . Unspecified constipation 01/02/2013  . Skin cancer 05/01/14    left calf  . Mitral valve prolapse   . Vitamin D deficiency   . SCC (squamous cell carcinoma), leg 04/12/2014    04/24/14 general surgeon: likely SCC to the left lower leg: non operative treatment.  Refer to dermatology to see there is any topical options available. May consider regional block and excision if there are no topical options.  05/01/14 Dermatology L calf lesion-biopsy. 1120/15: well differentiated invasive Squamous Cell Carcinoma. Mohs vs RT-Dr. Harvel Quale 05/21/14 06/11/14 MOHS 07/02/13     Past Surgical History  Procedure Laterality Date  . Total hip arthroplasty  2013  . Abdominal hysterectomy    . Skin biopsy Left 05/01/14    Left calf    Patient Care Team: Estill Dooms, MD as PCP - General (Internal Medicine) Gaynelle Arabian, MD as Consulting Physician (Orthopedic Surgery) Calvert Cantor, MD as Consulting Physician (Ophthalmology) Vania Rea, MD as Consulting Physician (Obstetrics and Gynecology)  History   Social History  . Marital Status: Single    Spouse Name: N/A  . Number of Children: N/A  . Years of Education: N/A   Occupational History  . Not on file.   Social History Main Topics  . Smoking status: Never Smoker   . Smokeless tobacco: Never Used  . Alcohol Use: No  . Drug Use: No  . Sexual Activity: No   Other Topics Concern  . Not on file   Social History Narrative     reports that she has never smoked. She has never used smokeless tobacco. She reports that she does not drink alcohol or use illicit drugs.  No family history on file. Family Status  Relation Status Death Age  . Mother Deceased   . Father Deceased     Immunization History  Administered Date(s) Administered  . Influenza-Unspecified 05/04/2013, 05/02/2014  .  Pneumococcal Polysaccharide-23 06/30/1995  . Td 06/29/2001    Allergies  Allergen Reactions  . Tramadol   . Zoloft [Sertraline Hcl]     Medications: Patient's Medications  New Prescriptions   No medications on file  Previous Medications   AMLODIPINE (NORVASC) 10 MG TABLET    Take 10 mg by mouth every morning.   AMLODIPINE (NORVASC) 10 MG TABLET    Take 10 mg by mouth daily.   CHOLECALCIFEROL (VITAMIN D) 1000  UNITS TABLET    Take 1,000 Units by mouth. 2 by mouth daily   CITALOPRAM (CELEXA) 40 MG TABLET    Take 0.5 tablets (20 mg total) by mouth every morning.   DONEPEZIL (ARICEPT) 10 MG TABLET    Take 10 mg by mouth at bedtime.   HYDROCODONE-ACETAMINOPHEN (NORCO/VICODIN) 5-325 MG PER TABLET    Take 1 tablet by mouth every 4 (four) hours as needed for moderate pain.   LEVOTHYROXINE (SYNTHROID, LEVOTHROID) 100 MCG TABLET    Take 100 mcg by mouth daily before breakfast. **Check pulse weekly on Monday**   MIRTAZAPINE (REMERON) 7.5 MG TABLET    Take 7.5 mg by mouth at bedtime.   MULTIPLE VITAMIN (THERA/BETA-CAROTENE) TABS    Take by mouth. 1 by mouth daily   POLYETHYLENE GLYCOL (MIRALAX / GLYCOLAX) PACKET    Take 17 g by mouth. Fill cap to 17 GM mark, mix with 4-8 ounces of fluid and take by mouth every day  Modified Medications   No medications on file  Discontinued Medications   No medications on file    Review of Systems  Constitutional: Negative for fever, chills and diaphoresis.  HENT: Positive for hearing loss. Negative for congestion, ear discharge, ear pain, nosebleeds, sore throat and tinnitus.   Eyes: Negative for photophobia, pain, discharge and redness.  Respiratory: Negative for cough, shortness of breath, wheezing and stridor.   Cardiovascular: Positive for leg swelling. Negative for chest pain and palpitations.       Trace only in BLE  Gastrointestinal: Negative for nausea, vomiting, abdominal pain, diarrhea, constipation and blood in stool.  Endocrine: Negative for polydipsia.  Genitourinary: Positive for frequency. Negative for dysuria, urgency, hematuria and flank pain.  Musculoskeletal: Negative for myalgias, back pain and neck pain.  Skin: Negative for rash.       History squamous cell cancer of the left leg. Fully healed. No relapse.  Allergic/Immunologic: Negative for environmental allergies.  Neurological: Negative for dizziness, tremors, seizures, weakness and headaches.    Hematological: Does not bruise/bleed easily.  Psychiatric/Behavioral: Negative for suicidal ideas and hallucinations. The patient is not nervous/anxious.     Filed Vitals:   11/01/14 1534  BP: 130/70  Pulse: 74  Temp: 98 F (36.7 C)  Resp: 20  Height: 4\' 10"  (1.473 m)  Weight: 121 lb 11.2 oz (55.203 kg)   Body mass index is 25.44 kg/(m^2).  Physical Exam  Constitutional:  Frail. Elderly.  HENT:  Head: Normocephalic.  Right Ear: External ear normal.  Left Ear: External ear normal.  Mouth/Throat: No oropharyngeal exudate.  Torus mandibularis.   Eyes: Conjunctivae and EOM are normal. Pupils are equal, round, and reactive to light.  Corrective lenses  Neck: No JVD present. No tracheal deviation present. No thyromegaly present.  Cardiovascular: Normal rate, regular rhythm, normal heart sounds and intact distal pulses.  Exam reveals no gallop.   No murmur heard. Pulmonary/Chest: No respiratory distress. She has no wheezes. She has rales. She exhibits no tenderness.  Abdominal: She exhibits no distension  and no mass. There is no tenderness.  Musculoskeletal: Normal range of motion. She exhibits edema and tenderness.  Unstable gait. Using walker. Left shoulder pain with decreased ROM. Trace edema in ankles.   Lymphadenopathy:    She has no cervical adenopathy.  Neurological: She is alert. No cranial nerve deficit.  Significant memory loss. 11/17/2014 MMSE 19/30  Skin: No rash noted. No erythema. No pallor.  Psychiatric: She has a normal mood and affect. Her speech is normal and behavior is normal. Judgment and thought content normal. Cognition and memory are impaired. She exhibits abnormal recent memory and abnormal remote memory.     Labs reviewed: Lab on 09/05/2014  Component Date Value Ref Range Status  . Hemoglobin 09/04/2014 11.5* 12.0 - 16.0 g/dL Final  . HCT 09/04/2014 36  36 - 46 % Final  . Platelets 09/04/2014 222  150 - 399 K/L Final  . WBC 09/04/2014 3.9    Final  . Glucose 09/04/2014 75   Final  . BUN 09/04/2014 17  4 - 21 mg/dL Final  . Creatinine 09/04/2014 1.1  0.5 - 1.1 mg/dL Final  . Potassium 09/04/2014 3.6  3.4 - 5.3 mmol/L Final  . Sodium 09/04/2014 139  137 - 147 mmol/L Final  . Alkaline Phosphatase 09/04/2014 55  25 - 125 U/L Final  . ALT 09/04/2014 8  7 - 35 U/L Final  . AST 09/04/2014 17  13 - 35 U/L Final  . Bilirubin, Total 09/04/2014 0.6   Final  . TSH 09/04/2014 4.89  0.41 - 5.90 uIU/mL Final     Assessment/Plan  1. Dementia, without behavioral disturbance Progressive. Loss of functional abilities.  2. Essential hypertension Controlled  3. Hypothyroidism, unspecified hypothyroidism type Compensated  4. Unstable gait High fall risk  5. Edema improved  6. SCC (squamous cell carcinoma), leg, left resolved

## 2014-11-11 ENCOUNTER — Emergency Department (HOSPITAL_COMMUNITY): Payer: Medicare Other

## 2014-11-11 ENCOUNTER — Emergency Department (HOSPITAL_COMMUNITY)
Admission: EM | Admit: 2014-11-11 | Discharge: 2014-11-12 | Disposition: A | Discharge status: Home / Self Care | Payer: Medicare Other | Attending: Emergency Medicine | Admitting: Emergency Medicine

## 2014-11-11 ENCOUNTER — Encounter (HOSPITAL_COMMUNITY): Payer: Self-pay | Admitting: Emergency Medicine

## 2014-11-11 DIAGNOSIS — E039 Hypothyroidism, unspecified: Secondary | ICD-10-CM | POA: Insufficient documentation

## 2014-11-11 DIAGNOSIS — I10 Essential (primary) hypertension: Secondary | ICD-10-CM | POA: Insufficient documentation

## 2014-11-11 DIAGNOSIS — N390 Urinary tract infection, site not specified: Secondary | ICD-10-CM | POA: Diagnosis not present

## 2014-11-11 DIAGNOSIS — Z85828 Personal history of other malignant neoplasm of skin: Secondary | ICD-10-CM | POA: Diagnosis not present

## 2014-11-11 DIAGNOSIS — K5900 Constipation, unspecified: Secondary | ICD-10-CM | POA: Insufficient documentation

## 2014-11-11 DIAGNOSIS — Z79899 Other long term (current) drug therapy: Secondary | ICD-10-CM | POA: Diagnosis not present

## 2014-11-11 DIAGNOSIS — F0390 Unspecified dementia without behavioral disturbance: Secondary | ICD-10-CM | POA: Diagnosis not present

## 2014-11-11 DIAGNOSIS — Z862 Personal history of diseases of the blood and blood-forming organs and certain disorders involving the immune mechanism: Secondary | ICD-10-CM | POA: Diagnosis not present

## 2014-11-11 DIAGNOSIS — R079 Chest pain, unspecified: Secondary | ICD-10-CM | POA: Insufficient documentation

## 2014-11-11 LAB — BASIC METABOLIC PANEL
Anion gap: 12 (ref 5–15)
BUN: 16 mg/dL (ref 6–20)
CHLORIDE: 99 mmol/L — AB (ref 101–111)
CO2: 27 mmol/L (ref 22–32)
Calcium: 9.2 mg/dL (ref 8.9–10.3)
Creatinine, Ser: 1.37 mg/dL — ABNORMAL HIGH (ref 0.44–1.00)
GFR calc Af Amer: 36 mL/min — ABNORMAL LOW (ref >60)
GFR, EST NON AFRICAN AMERICAN: 31 mL/min — AB (ref >60)
Glucose, Bld: 124 mg/dL — ABNORMAL HIGH (ref 65–99)
POTASSIUM: 3.6 mmol/L (ref 3.5–5.1)
SODIUM: 138 mmol/L (ref 135–145)

## 2014-11-11 LAB — I-STAT TROPONIN, ED: Troponin i, poc: 0.01 ng/mL (ref 0.00–0.08)

## 2014-11-11 LAB — CBC
HEMATOCRIT: 39.3 % (ref 36.0–46.0)
Hemoglobin: 12.8 g/dL (ref 12.0–15.0)
MCH: 29.5 pg (ref 26.0–34.0)
MCHC: 32.6 g/dL (ref 30.0–36.0)
MCV: 90.6 fL (ref 78.0–100.0)
PLATELETS: 236 10*3/uL (ref 150–400)
RBC: 4.34 MIL/uL (ref 3.87–5.11)
RDW: 12.9 % (ref 11.5–15.5)
WBC: 15.3 10*3/uL — AB (ref 4.0–10.5)

## 2014-11-11 LAB — BRAIN NATRIURETIC PEPTIDE: B NATRIURETIC PEPTIDE 5: 190.9 pg/mL — AB (ref 0.0–100.0)

## 2014-11-11 NOTE — ED Provider Notes (Signed)
CSN: [id number]     Arrival date & time 11/11/14  1959 History   First MD Initiated Contact with Patient 11/11/14 2029     Chief Complaint  Patient presents with  . Chest Pain   Jasmin Rodriguez is a 81 y.o. female with a past medical history significant for dementia, hypothyroidism, hypertension, and hyperlipidemia who presents from her skilled nursing facility for chest pain. The history was obtained primarily from the patient's nursing home who report that the patient was normal during dinner however, shortly after returning back to her room, the patient called out in pain. The nurses evaluated her and report that she said he was having chest pain and "pain all over". They reportedly spoke with the physician on-call who instructed them to provide her with a sublingual nitroglycerin. The patient was given the nitroglycerin and then passed out for several minutes. The patient then woke up and was at her baseline per her nursing staff at the facility.  The EMS team arrived and provided the patient with aspirin and transport to the emergency department for further evaluation.  On arrival, the patient vehemently denies any complaints. The patient denies chest pain, shortness of breath, nausea, vomiting, constipation, diarrhea, dysuria, rashes, headache, vision changes. The patient is alert however, she says that she has been in her exam room for 2 weeks. The patient does not remember the episode of concern at her nursing home. On arrival, the patient has a normal temperature, unremarkable pulse, respiratory rate, and her blood pressure was unremarkable 137/50. The patient's oxygen saturation was 95% on room air.  (Consider location/radiation/quality/duration/timing/severity/associated sxs/prior Treatment) Patient is a 81 y.o. female presenting with chest pain. The history is provided by the patient and the nursing home. No language interpreter was used.  Chest Pain Pain location:  Unable to  specify Pain quality: sharp   Pain severity:  Unable to specify Onset quality:  Unable to specify Timing:  Unable to specify Progression:  Resolved Chronicity:  New Context: at rest   Context: no trauma   Relieved by:  Nitroglycerin Worsened by:  Nothing tried Ineffective treatments:  None tried Associated symptoms: no abdominal pain, no anxiety, no back pain, no cough, no diaphoresis, no dizziness, no fatigue, no fever, no headache, no nausea, no numbness, no palpitations, no shortness of breath, not vomiting and no weakness   Risk factors: not female     Past Medical History  Diagnosis Date  . Hypertension   . Anemia   . Hypothyroidism   . Hyperlipidemia   . Osteopenia   . Radial fracture   . Femur fracture   . Dementia 10/12/2012  . Unstable gait 04/16/2014  . Edema 11/10/2012  . Unspecified constipation 01/02/2013  . Skin cancer 05/01/14    left calf  . Mitral valve prolapse   . Vitamin D deficiency   . SCC (squamous cell carcinoma), leg 04/12/2014    04/24/14 general surgeon: likely SCC to the left lower leg: non operative treatment. Refer to dermatology to see there is any topical options available. May consider regional block and excision if there are no topical options.  05/01/14 Dermatology L calf lesion-biopsy. 1120/15: well differentiated invasive Squamous Cell Carcinoma. Mohs vs RT-Dr. Harvel Quale 05/21/14 06/11/14 MOHS 07/02/13    Past Surgical History  Procedure Laterality Date  . Total hip arthroplasty  2013  . Abdominal hysterectomy    . Skin biopsy Left 05/01/14    Left calf   History reviewed. No pertinent family history. History  Substance Use Topics  . Smoking status: Never Smoker   . Smokeless tobacco: Never Used  . Alcohol Use: No   OB History    No data available     Review of Systems  Constitutional: Negative for fever, chills, diaphoresis, appetite change and fatigue.  HENT: Negative for congestion.   Eyes: Negative for visual disturbance.   Respiratory: Negative for cough, choking, chest tightness, shortness of breath, wheezing and stridor.   Cardiovascular: Negative for chest pain and palpitations.  Gastrointestinal: Negative for nausea, vomiting, abdominal pain, diarrhea and constipation.  Genitourinary: Negative for dysuria, flank pain and difficulty urinating.  Musculoskeletal: Negative for back pain.  Skin: Negative for rash and wound.  Neurological: Negative for dizziness, weakness, numbness and headaches.  Psychiatric/Behavioral: Negative for agitation.  All other systems reviewed and are negative.     Allergies  Tramadol and Zoloft  Home Medications   Prior to Admission medications   Medication Sig Start Date End Date Taking? Authorizing Provider  amLODipine (NORVASC) 10 MG tablet Take 10 mg by mouth daily.   Yes Historical Provider, MD  citalopram (CELEXA) 20 MG tablet Take 20 mg by mouth daily. 10/04/14  Yes Historical Provider, MD  donepezil (ARICEPT) 10 MG tablet Take 10 mg by mouth at bedtime.   Yes Historical Provider, MD  levothyroxine (SYNTHROID, LEVOTHROID) 100 MCG tablet Take 100 mcg by mouth daily at 6 (six) AM. **Check pulse weekly on Monday**   Yes Historical Provider, MD  LORazepam (ATIVAN) 0.5 MG tablet Take 0.5 mg by mouth 2 (two) times a week. Take 1 tablet (0.5 mg) prior to bath (shower) on Tuesday and Friday 10/24/14  Yes Historical Provider, MD  Multiple Vitamin (THERA/BETA-CAROTENE) TABS Take 1 tablet by mouth daily at 12 noon.    Yes Historical Provider, MD  nitroGLYCERIN (NITROSTAT) 0.4 MG SL tablet Place 0.4 mg under the tongue every 5 (five) minutes as needed for chest pain.   Yes Historical Provider, MD  Nutritional Supplements (BOOST BREEZE) LIQD Take 237 mLs by mouth 2 (two) times daily.   Yes Historical Provider, MD  polyethylene glycol powder (GLYCOLAX/MIRALAX) powder Take 17 g by mouth daily. Mix in 4-8 oz of fluid and drink 09/23/14  Yes Historical Provider, MD  citalopram (CELEXA) 40  MG tablet Take 0.5 tablets (20 mg total) by mouth every morning. Patient not taking: Reported on 11/11/2014 11/08/13   Man Mast X, NP   BP 138/55 mmHg  Pulse 65  Temp(Src) 98.6 F (37 C) (Oral)  Resp 16  SpO2 96% Physical Exam  Constitutional: She appears well-developed and well-nourished. No distress.  HENT:  Head: Normocephalic.  Mouth/Throat: No oropharyngeal exudate.  Eyes: Conjunctivae and EOM are normal. Pupils are equal, round, and reactive to light.  Neck: Normal range of motion. No thyromegaly present.  Cardiovascular: Regular rhythm, normal heart sounds and intact distal pulses.   No murmur heard. Pulmonary/Chest: Effort normal and breath sounds normal. No stridor. No respiratory distress. She has no wheezes. She has no rales. She exhibits no tenderness.  Abdominal: Soft. Bowel sounds are normal. She exhibits no distension. There is no tenderness. There is no rebound and no guarding.  Musculoskeletal: She exhibits no tenderness.  Neurological: She is alert. She has normal reflexes. She displays normal reflexes. No cranial nerve deficit. She exhibits normal muscle tone.  Skin: Skin is warm and dry. No rash noted. She is not diaphoretic. No erythema.  Psychiatric: She has a normal mood and affect.  Vitals reviewed.  ED Course  Procedures (including critical care time) Labs Review Labs Reviewed  CBC - Abnormal; Notable for the following:    WBC 15.3 (*)    All other components within normal limits  BASIC METABOLIC PANEL - Abnormal; Notable for the following:    Chloride 99 (*)    Glucose, Bld 124 (*)    Creatinine, Ser 1.37 (*)    GFR calc non Af Amer 31 (*)    GFR calc Af Amer 36 (*)    All other components within normal limits  BRAIN NATRIURETIC PEPTIDE - Abnormal; Notable for the following:    B Natriuretic Peptide 190.9 (*)    All other components within normal limits  I-STAT TROPOININ, ED    Imaging Review Dg Chest 2 View  11/11/2014   CLINICAL DATA:  Acute  onset of chest pain.  Initial encounter.  EXAM: CHEST  2 VIEW  COMPARISON:  Chest radiograph performed 10/30/2012  FINDINGS: The lungs are well-aerated. Minimal bibasilar atelectasis is noted. Pulmonary vascularity is at the upper limits of normal. A 1.1 cm nodule is noted at the right lung base, of uncertain significance. No pleural effusion or pneumothorax is seen.  The heart is mildly enlarged. No acute osseous abnormalities are seen.  IMPRESSION: 1. Minimal bibasilar atelectasis noted.  Mild cardiomegaly seen. 2. 1.1 cm nodule noted at the right lung base.   Electronically Signed   By: Garald Balding M.D.   On: 11/11/2014 21:32     EKG Interpretation   Date/Time:  Sunday Nov 11 2014 20:09:29 EDT Ventricular Rate:  71 PR Interval:  62 QRS Duration: 111 QT Interval:  431 QTC Calculation: 468 R Axis:   -38 Text Interpretation:  Sinus rhythm Multiple ventricular premature  complexes Sinus pause Incomplete left bundle branch block Inferior  infarct, old No significant change since last tracing Confirmed by  Kathrynn Humble, MD, [facility] [phone]) on 11/11/2014 8:42:19 PM      MDM   Final diagnoses:  Chest pain, unspecified chest pain type  UTI (lower urinary tract infection)    Jasmin Rodriguez is a 81 y.o. female with a past medical history significant for dementia, hypothyroidism, hypertension, and hyperlipidemia who presents from her skilled nursing facility for chest pain. The patient does not appear to have a history of coronary artery disease however, given her age and the reported description of her chest pain, an ACS workup will be performed. The patient had diagnostic laboratory and imaging testing which revealed an EKG similar to prior, a troponin that was negative, a mildly elevated BNP however, the patient denied any swelling, and a CBC which showed a mild leukocytosis of 15.3. The patient had a chest x-ray which did not show pneumonia but showed a small nodule.  The patient initially  refused urinalysis however, she was convinced to provide one.  The patient's urine revealed evidence of a urinary tract infection with many bacteria, leukocytes, and turbid appearance. The urine was obtained by catheter and there was rare squamous cells.   The patient continued to request discharge as she reports that she was feeling at her baseline. After several hours of observation, the patient did not have any new chest pain or any further symptoms.  The patient was given a dose of Keflex he'll be given a prescription at discharge. The  source of the patient's  leukocytosis is felt to be the urinary tract infection. As the patient has continued to appear well, she was felt appropriate for discharge. The patient  will follow up with her PCP in the next several days and was instructed to return to emergency department if she developed any new symptoms.   The patient understood the plan of care and was discharged in good condition.  This patient was seen with Dr. Kathrynn Humble, ED Attending.      Antony Blackbird, MD 11/12/14 0539  Varney Biles, MD 11/20/14 1725

## 2014-11-11 NOTE — ED Notes (Signed)
[facility], pt was given 1 Nitro at nursing home and pass out right after Nitro given by nursing home staff, 324 mg PO ASA given by [facility] on arrival refuses to have it. Hx of dementia, denies any cp now.

## 2014-11-11 NOTE — ED Notes (Signed)
Pt very confuse now refusing to have Vitals sign taken.

## 2014-11-12 ENCOUNTER — Non-Acute Institutional Stay (SKILLED_NURSING_FACILITY): Payer: Medicare Other | Admitting: Nurse Practitioner

## 2014-11-12 ENCOUNTER — Encounter: Payer: Self-pay | Admitting: Nurse Practitioner

## 2014-11-12 DIAGNOSIS — I10 Essential (primary) hypertension: Secondary | ICD-10-CM

## 2014-11-12 DIAGNOSIS — F0390 Unspecified dementia without behavioral disturbance: Secondary | ICD-10-CM

## 2014-11-12 DIAGNOSIS — N390 Urinary tract infection, site not specified: Secondary | ICD-10-CM

## 2014-11-12 DIAGNOSIS — F32A Depression, unspecified: Secondary | ICD-10-CM

## 2014-11-12 DIAGNOSIS — R609 Edema, unspecified: Secondary | ICD-10-CM

## 2014-11-12 DIAGNOSIS — F329 Major depressive disorder, single episode, unspecified: Secondary | ICD-10-CM | POA: Diagnosis not present

## 2014-11-12 DIAGNOSIS — K5900 Constipation, unspecified: Secondary | ICD-10-CM | POA: Diagnosis not present

## 2014-11-12 DIAGNOSIS — R2681 Unsteadiness on feet: Secondary | ICD-10-CM | POA: Diagnosis not present

## 2014-11-12 DIAGNOSIS — R079 Chest pain, unspecified: Secondary | ICD-10-CM | POA: Diagnosis not present

## 2014-11-12 DIAGNOSIS — S72001S Fracture of unspecified part of neck of right femur, sequela: Secondary | ICD-10-CM

## 2014-11-12 DIAGNOSIS — E039 Hypothyroidism, unspecified: Secondary | ICD-10-CM | POA: Diagnosis not present

## 2014-11-12 LAB — URINALYSIS, ROUTINE W REFLEX MICROSCOPIC
BILIRUBIN URINE: NEGATIVE
GLUCOSE, UA: NEGATIVE mg/dL
KETONES UR: NEGATIVE mg/dL
Nitrite: NEGATIVE
PH: 6 (ref 5.0–8.0)
PROTEIN: 30 mg/dL — AB
Specific Gravity, Urine: 1.018 (ref 1.005–1.030)
Urobilinogen, UA: 1 mg/dL (ref 0.0–1.0)

## 2014-11-12 LAB — URINE MICROSCOPIC-ADD ON

## 2014-11-12 MED ORDER — CEPHALEXIN 500 MG PO CAPS: 500.0000 mg | ORAL_CAPSULE | Freq: Two times a day (BID) | ORAL | Status: AC

## 2014-11-12 MED ORDER — CEPHALEXIN 250 MG PO CAPS
500.0000 mg | ORAL_CAPSULE | Freq: Once | ORAL | Status: AC
  Administered 2014-11-12: 500 mg via ORAL
  Filled 2014-11-12: qty 2

## 2014-11-12 NOTE — Assessment & Plan Note (Signed)
Controlled and takes Amlodipine 10mg  daily

## 2014-11-12 NOTE — Assessment & Plan Note (Signed)
Presented to ED 11/11/14 with c/o chest pain-no chest pain while in ED, passed out for a few minutes after NTG at [facility] prior to EMS. She was started on Keflex 500mg  bid x 7 days for UTI and related leukocytosis she is at her usual state of health today.

## 2014-11-12 NOTE — Assessment & Plan Note (Signed)
Stable on MiraLax daily.  

## 2014-11-12 NOTE — Assessment & Plan Note (Signed)
W/c for mobility °

## 2014-11-12 NOTE — Assessment & Plan Note (Signed)
Takes Aricept, requires SNF for care needs. Progressing gradually-difficulty of showering-Lorazepam prn prior to shower needed.

## 2014-11-12 NOTE — Assessment & Plan Note (Signed)
Stable. Takes Celexa to 20mg  and off Remeron 7.5mg  at night.

## 2014-11-12 NOTE — Discharge Instructions (Signed)

## 2014-11-12 NOTE — Assessment & Plan Note (Signed)
Continue Levothyroxine 18mcg. TSH 2.685 09/26/13 4.893 09/04/14

## 2014-11-12 NOTE — Assessment & Plan Note (Signed)
Only trace in ankles/feet.

## 2014-11-12 NOTE — Assessment & Plan Note (Signed)
10/15/14 dc [place] with D per pharm.  Healed.

## 2014-11-12 NOTE — Progress Notes (Signed)
Patient ID: Jasmin Rodriguez, female   DOB: 1914/02/16, 81 y.o.   MRN: [id number]   Code Status: DNR  Allergies  Allergen Reactions  . Tramadol Other (See Comments)    Listed on MAR  . Zoloft [Sertraline Hcl] Other (See Comments)    Listed on [facility]    Chief Complaint  Patient presents with  . Medical Management of Chronic Issues  . Acute Visit    11/11/14 ED visit for CP, UTI was identified    HPI: Patient is a 81 y.o. female seen in the SNF at [facility] today for evaluation of f/u ED visit 11/11/14 for chest pain w/o negative workup, she was identified with UTIchronic medical conditions.    11/11/14 ED eval for having chest pain and "pain all over". They reportedly spoke with the physician on-call who instructed them to provide her with a sublingual nitroglycerin. The patient was given the nitroglycerin and then passed out for several minutes. The patient denied chest pain at ED. EKG similar to prior, a troponin that was negative, a mildly elevated BNP however, the patient denied any swelling, and a CBC which showed a mild leukocytosis of 15.3. The patient had a chest x-ray which did not show pneumonia but showed a small nodule. Problem List Items Addressed This Visit    HTN (hypertension) (Chronic)    Controlled and takes Amlodipine 10mg  daily       Hypothyroidism (Chronic)    Continue Levothyroxine 159mcg. TSH 2.685 09/26/13 4.893 09/04/14       Dementia (Chronic)    Takes Aricept, requires SNF for care needs. Progressing gradually-difficulty of showering-Lorazepam prn prior to shower needed.        Edema (Chronic)    Only trace in ankles/feet.        Unstable gait (Chronic)    W/c for mobility.       Closed right hip fracture    10/15/14 dc [place] with D per pharm.  Healed.       Depression    Stable. Takes Celexa to 20mg  and off Remeron 7.5mg  at night.         Constipation    Stable on MiraLax daily.         Infection of urinary tract - Primary     Presented to ED 11/11/14 with c/o chest pain-no chest pain while in ED, passed out for a few minutes after NTG at [facility] prior to EMS. She was started on Keflex 500mg  bid x 7 days for UTI and related leukocytosis she is at her usual state of health today.          Review of Systems:  Review of Systems  Constitutional: Negative for fever, chills and diaphoresis.  HENT: Positive for hearing loss. Negative for congestion, ear discharge, ear pain, nosebleeds, sore throat and tinnitus.   Eyes: Negative for photophobia, pain, discharge and redness.  Respiratory: Negative for cough, shortness of breath, wheezing and stridor.   Cardiovascular: Positive for leg swelling. Negative for chest pain and palpitations.       Trace only in BLE  Gastrointestinal: Negative for nausea, vomiting, abdominal pain, diarrhea, constipation and blood in stool.  Endocrine: Negative for polydipsia.  Genitourinary: Positive for frequency. Negative for dysuria, urgency, hematuria and flank pain.  Musculoskeletal: Negative for myalgias, back pain and neck pain.  Skin: Negative for rash.  Allergic/Immunologic: Negative for environmental allergies.  Neurological: Negative for dizziness, tremors, seizures, weakness and headaches.  Hematological: Does not bruise/bleed easily.  Psychiatric/Behavioral: Negative for suicidal ideas and hallucinations. The patient is not nervous/anxious.      Past Medical History  Diagnosis Date  . Hypertension   . Anemia   . Hypothyroidism   . Hyperlipidemia   . Osteopenia   . Radial fracture   . Femur fracture   . Dementia 10/12/2012  . Unstable gait 04/16/2014  . Edema 11/10/2012  . Unspecified constipation 01/02/2013  . Skin cancer 05/01/14    left calf  . Mitral valve prolapse   . Vitamin D deficiency   . SCC (squamous cell carcinoma), leg 04/12/2014    04/24/14 general surgeon: likely SCC to the left lower leg: non operative treatment. Refer to dermatology to see there is  any topical options available. May consider regional block and excision if there are no topical options.  05/01/14 Dermatology L calf lesion-biopsy. 1120/15: well differentiated invasive Squamous Cell Carcinoma. Mohs vs RT-Dr. Harvel Quale 05/21/14 06/11/14 MOHS 07/02/13    Past Surgical History  Procedure Laterality Date  . Total hip arthroplasty  2013  . Abdominal hysterectomy    . Skin biopsy Left 05/01/14    Left calf   Social History:   reports that she has never smoked. She has never used smokeless tobacco. She reports that she does not drink alcohol or use illicit drugs.  No family history on file.  Medications: Patient's Medications  New Prescriptions   No medications on file  Previous Medications   AMLODIPINE (NORVASC) 10 MG TABLET    Take 10 mg by mouth daily.   CEPHALEXIN (KEFLEX) 500 MG CAPSULE    Take 1 capsule (500 mg total) by mouth 2 (two) times daily.   CITALOPRAM (CELEXA) 20 MG TABLET    Take 20 mg by mouth daily.   CITALOPRAM (CELEXA) 40 MG TABLET    Take 0.5 tablets (20 mg total) by mouth every morning.   DONEPEZIL (ARICEPT) 10 MG TABLET    Take 10 mg by mouth at bedtime.   LEVOTHYROXINE (SYNTHROID, LEVOTHROID) 100 MCG TABLET    Take 100 mcg by mouth daily at 6 (six) AM. **Check pulse weekly on Monday**   LORAZEPAM (ATIVAN) 0.5 MG TABLET    Take 0.5 mg by mouth 2 (two) times a week. Take 1 tablet (0.5 mg) prior to bath (shower) on Tuesday and Friday   MULTIPLE VITAMIN (THERA/BETA-CAROTENE) TABS    Take 1 tablet by mouth daily at 12 noon.    NITROGLYCERIN (NITROSTAT) 0.4 MG SL TABLET    Place 0.4 mg under the tongue every 5 (five) minutes as needed for chest pain.   NUTRITIONAL SUPPLEMENTS (BOOST BREEZE) LIQD    Take 237 mLs by mouth 2 (two) times daily.   POLYETHYLENE GLYCOL POWDER (GLYCOLAX/MIRALAX) POWDER    Take 17 g by mouth daily. Mix in 4-8 oz of fluid and drink  Modified Medications   No medications on file  Discontinued Medications   No medications on file      Physical Exam: Physical Exam  Constitutional: She is oriented to person, place, and time.  Frail. Elderly.  HENT:  Head: Normocephalic.  Right Ear: External ear normal.  Left Ear: External ear normal.  Mouth/Throat: No oropharyngeal exudate.  Torus mandibularis.   Eyes: Conjunctivae and EOM are normal. Pupils are equal, round, and reactive to light.  Neck: No JVD present. No tracheal deviation present. No thyromegaly present.  Cardiovascular: Normal rate, regular rhythm, normal heart sounds and intact distal pulses.  Exam reveals no gallop.   No  murmur heard. Pulmonary/Chest: No respiratory distress. She has no wheezes. She has rales. She exhibits no tenderness.  Abdominal: She exhibits no distension and no mass. There is no tenderness.  Musculoskeletal: Normal range of motion. She exhibits edema and tenderness.  Unstable gait. Using walker. Left shoulder pain with decreased ROM. Trace edema in ankles.   Lymphadenopathy:    She has no cervical adenopathy.  Neurological: She is alert and oriented to person, place, and time. No cranial nerve deficit.  Skin: No rash noted. No erythema. No pallor.  Psychiatric: She has a normal mood and affect. Her speech is normal and behavior is normal. Judgment and thought content normal. Cognition and memory are impaired. She exhibits abnormal recent memory and abnormal remote memory.    Filed Vitals:   11/12/14 1512  BP: 130/60  Pulse: 72  Temp: 97.6 F (36.4 C)  TempSrc: Tympanic  Resp: 18      Labs reviewed: Basic Metabolic Panel:  Recent Labs  09/04/14 11/11/14 2037  NA 139 138  K 3.6 3.6  CL  --  99*  CO2  --  27  GLUCOSE  --  124*  BUN 17 16  CREATININE 1.1 1.37*  CALCIUM  --  9.2  TSH 4.89  --    Liver Function Tests:  Recent Labs  09/04/14  AST 17  ALT 8  ALKPHOS 55   No results for input(s): LIPASE, AMYLASE in the last 8760 hours. No results for input(s): AMMONIA in the last 8760 hours. CBC:  Recent  Labs  09/04/14 11/11/14 2037  WBC 3.9 15.3*  HGB 11.5* 12.8  HCT 36 39.3  MCV  --  90.6  PLT 222 236   Lipid Panel: No results for input(s): CHOL, HDL, LDLCALC, TRIG, CHOLHDL, LDLDIRECT in the last 8760 hours.  Past Procedures: 11/11/14 EKG Sinus rhythm Multiple ventricular premature  complexes Sinus pause Incomplete left bundle branch block Inferior  infarct, old No significant change since last tracing   11/11/14 CXR  IMPRESSION: 1. Minimal bibasilar atelectasis noted. Mild cardiomegaly seen. 2. 1.1 cm nodule noted at the right lung base.    Assessment/Plan Infection of urinary tract Presented to ED 11/11/14 with c/o chest pain-no chest pain while in ED, passed out for a few minutes after NTG at [facility] prior to EMS. She was started on Keflex 500mg  bid x 7 days for UTI and related leukocytosis she is at her usual state of health today.    HTN (hypertension) Controlled and takes Amlodipine 10mg  daily    Hypothyroidism Continue Levothyroxine 137mcg. TSH 2.685 09/26/13 4.893 09/04/14    Dementia Takes Aricept, requires SNF for care needs. Progressing gradually-difficulty of showering-Lorazepam prn prior to shower needed.     Edema Only trace in ankles/feet.     Unstable gait W/c for mobility.    Closed right hip fracture 10/15/14 dc [place] with D per pharm.  Healed.    Depression Stable. Takes Celexa to 20mg  and off Remeron 7.5mg  at night.      Constipation Stable on MiraLax daily.        Family/ Staff Communication: observe the patient  Goals of Care: SNF  Labs/tests ordered: none

## 2014-11-14 LAB — URINE CULTURE

## 2014-11-15 ENCOUNTER — Telehealth (HOSPITAL_BASED_OUTPATIENT_CLINIC_OR_DEPARTMENT_OTHER): Payer: Self-pay | Admitting: Emergency Medicine

## 2014-11-15 NOTE — Telephone Encounter (Signed)
Post ED Visit - Positive Culture Follow-up  Culture report reviewed by antimicrobial stewardship pharmacist: [x]  Wes [place], Pharm.D., BCPS []  Heide Guile, Pharm.D., BCPS []  Alycia Rossetti, Pharm.D., BCPS []  [place], Pharm.D., BCPS, AAHIVP []  Legrand Como, Pharm.D., BCPS, AAHIVP []  Isac Sarna, Pharm.D., BCPS  Positive urine culture E. coli Treated with cephalexin, organism sensitive to the same and no further patient follow-up is required at this time.  Hazle Nordmann 11/15/2014, 11:40 AM

## 2014-12-12 ENCOUNTER — Non-Acute Institutional Stay (SKILLED_NURSING_FACILITY): Payer: Medicare Other | Admitting: Nurse Practitioner

## 2014-12-12 ENCOUNTER — Encounter: Payer: Self-pay | Admitting: Nurse Practitioner

## 2014-12-12 DIAGNOSIS — R609 Edema, unspecified: Secondary | ICD-10-CM

## 2014-12-12 DIAGNOSIS — I10 Essential (primary) hypertension: Secondary | ICD-10-CM | POA: Diagnosis not present

## 2014-12-12 DIAGNOSIS — F329 Major depressive disorder, single episode, unspecified: Secondary | ICD-10-CM | POA: Diagnosis not present

## 2014-12-12 DIAGNOSIS — N390 Urinary tract infection, site not specified: Secondary | ICD-10-CM

## 2014-12-12 DIAGNOSIS — E039 Hypothyroidism, unspecified: Secondary | ICD-10-CM

## 2014-12-12 DIAGNOSIS — K5900 Constipation, unspecified: Secondary | ICD-10-CM

## 2014-12-12 DIAGNOSIS — F0390 Unspecified dementia without behavioral disturbance: Secondary | ICD-10-CM

## 2014-12-12 DIAGNOSIS — F32A Depression, unspecified: Secondary | ICD-10-CM

## 2014-12-12 NOTE — Assessment & Plan Note (Signed)
Stable. Takes Celexa to 20mg  and off Remeron 7.5mg  at night.

## 2014-12-12 NOTE — Assessment & Plan Note (Signed)
Only trace in ankles/feet.

## 2014-12-12 NOTE — Assessment & Plan Note (Signed)
Continue Levothyroxine 178mcg. TSH 2.685 09/26/13 4.893 09/04/14

## 2014-12-12 NOTE — Progress Notes (Signed)
Patient ID: Jasmin Rodriguez, female   DOB: 03-13-1914, 81 y.o.   MRN: [id number]   Code Status: DNR  Allergies  Allergen Reactions  . Tramadol Other (See Comments)    Listed on MAR  . Zoloft [Sertraline Hcl] Other (See Comments)    Listed on [facility]    Chief Complaint  Patient presents with  . Medical Management of Chronic Issues    HPI: Patient is a 81 y.o. female seen in the SNF at [facility] today for evaluation of chronic medical conditions.    11/11/14 ED eval for having chest pain and "pain all over". They reportedly spoke with the physician on-call who instructed them to provide her with a sublingual nitroglycerin. The patient was given the nitroglycerin and then passed out for several minutes. The patient denied chest pain at ED. EKG similar to prior, a troponin that was negative, a mildly elevated BNP however, the patient denied any swelling, and a CBC which showed a mild leukocytosis of 15.3. The patient had a chest x-ray which did not show pneumonia but showed a small nodule. Problem List Items Addressed This Visit    None      Review of Systems:  Review of Systems  Constitutional: Negative for fever, chills and diaphoresis.  HENT: Positive for hearing loss. Negative for congestion, ear discharge, ear pain, nosebleeds, sore throat and tinnitus.   Eyes: Negative for photophobia, pain, discharge and redness.  Respiratory: Negative for cough, shortness of breath, wheezing and stridor.   Cardiovascular: Positive for leg swelling. Negative for chest pain and palpitations.       Trace only in BLE  Gastrointestinal: Negative for nausea, vomiting, abdominal pain, diarrhea, constipation and blood in stool.  Endocrine: Negative for polydipsia.  Genitourinary: Positive for frequency. Negative for dysuria, urgency, hematuria and flank pain.  Musculoskeletal: Negative for myalgias, back pain and neck pain.  Skin: Negative for rash.  Allergic/Immunologic: Negative for  environmental allergies.  Neurological: Negative for dizziness, tremors, seizures, weakness and headaches.  Hematological: Does not bruise/bleed easily.  Psychiatric/Behavioral: Negative for suicidal ideas and hallucinations. The patient is not nervous/anxious.      Past Medical History  Diagnosis Date  . Hypertension   . Anemia   . Hypothyroidism   . Hyperlipidemia   . Osteopenia   . Radial fracture   . Femur fracture   . Dementia 10/12/2012  . Unstable gait 04/16/2014  . Edema 11/10/2012  . Unspecified constipation 01/02/2013  . Skin cancer 05/01/14    left calf  . Mitral valve prolapse   . Vitamin D deficiency   . SCC (squamous cell carcinoma), leg 04/12/2014    04/24/14 general surgeon: likely SCC to the left lower leg: non operative treatment. Refer to dermatology to see there is any topical options available. May consider regional block and excision if there are no topical options.  05/01/14 Dermatology L calf lesion-biopsy. 1120/15: well differentiated invasive Squamous Cell Carcinoma. Mohs vs RT-Dr. Harvel Quale 05/21/14 06/11/14 MOHS 07/02/13    Past Surgical History  Procedure Laterality Date  . Total hip arthroplasty  2013  . Abdominal hysterectomy    . Skin biopsy Left 05/01/14    Left calf   Social History:   reports that she has never smoked. She has never used smokeless tobacco. She reports that she does not drink alcohol or use illicit drugs.  No family history on file.  Medications: Patient's Medications  New Prescriptions   No medications on file  Previous Medications  AMLODIPINE (NORVASC) 10 MG TABLET    Take 10 mg by mouth daily.   CITALOPRAM (CELEXA) 20 MG TABLET    Take 20 mg by mouth daily.   CITALOPRAM (CELEXA) 40 MG TABLET    Take 0.5 tablets (20 mg total) by mouth every morning.   DONEPEZIL (ARICEPT) 10 MG TABLET    Take 10 mg by mouth at bedtime.   LEVOTHYROXINE (SYNTHROID, LEVOTHROID) 100 MCG TABLET    Take 100 mcg by mouth daily at 6 (six) AM. **Check  pulse weekly on Monday**   LORAZEPAM (ATIVAN) 0.5 MG TABLET    Take 0.5 mg by mouth 2 (two) times a week. Take 1 tablet (0.5 mg) prior to bath (shower) on Tuesday and Friday   MULTIPLE VITAMIN (THERA/BETA-CAROTENE) TABS    Take 1 tablet by mouth daily at 12 noon.    NITROGLYCERIN (NITROSTAT) 0.4 MG SL TABLET    Place 0.4 mg under the tongue every 5 (five) minutes as needed for chest pain.   NUTRITIONAL SUPPLEMENTS (BOOST BREEZE) LIQD    Take 237 mLs by mouth 2 (two) times daily.   POLYETHYLENE GLYCOL POWDER (GLYCOLAX/MIRALAX) POWDER    Take 17 g by mouth daily. Mix in 4-8 oz of fluid and drink  Modified Medications   No medications on file  Discontinued Medications   No medications on file     Physical Exam: Physical Exam  Constitutional: She is oriented to person, place, and time.  Frail. Elderly.  HENT:  Head: Normocephalic.  Right Ear: External ear normal.  Left Ear: External ear normal.  Mouth/Throat: No oropharyngeal exudate.  Torus mandibularis.   Eyes: Conjunctivae and EOM are normal. Pupils are equal, round, and reactive to light.  Neck: No JVD present. No tracheal deviation present. No thyromegaly present.  Cardiovascular: Normal rate, regular rhythm, normal heart sounds and intact distal pulses.  Exam reveals no gallop.   No murmur heard. Pulmonary/Chest: No respiratory distress. She has no wheezes. She has rales. She exhibits no tenderness.  Abdominal: She exhibits no distension and no mass. There is no tenderness.  Musculoskeletal: Normal range of motion. She exhibits edema and tenderness.  Unstable gait. Using walker. Left shoulder pain with decreased ROM. Trace edema in ankles.   Lymphadenopathy:    She has no cervical adenopathy.  Neurological: She is alert and oriented to person, place, and time. No cranial nerve deficit.  Skin: No rash noted. No erythema. No pallor.  Psychiatric: She has a normal mood and affect. Her speech is normal and behavior is normal.  Judgment and thought content normal. Cognition and memory are impaired. She exhibits abnormal recent memory and abnormal remote memory.    Filed Vitals:   12/12/14 1511  BP: 120/60  Pulse: 76  Temp: 97.8 F (36.6 C)  TempSrc: Tympanic  Resp: 18      Labs reviewed: Basic Metabolic Panel:  Recent Labs  09/04/14 11/11/14 2037  NA 139 138  K 3.6 3.6  CL  --  99*  CO2  --  27  GLUCOSE  --  124*  BUN 17 16  CREATININE 1.1 1.37*  CALCIUM  --  9.2  TSH 4.89  --    Liver Function Tests:  Recent Labs  09/04/14  AST 17  ALT 8  ALKPHOS 55   No results for input(s): LIPASE, AMYLASE in the last 8760 hours. No results for input(s): AMMONIA in the last 8760 hours. CBC:  Recent Labs  09/04/14 11/11/14 2037  WBC 3.9  15.3*  HGB 11.5* 12.8  HCT 36 39.3  MCV  --  90.6  PLT 222 236   Lipid Panel: No results for input(s): CHOL, HDL, LDLCALC, TRIG, CHOLHDL, LDLDIRECT in the last 8760 hours.  Past Procedures: 11/11/14 EKG Sinus rhythm Multiple ventricular premature  complexes Sinus pause Incomplete left bundle branch block Inferior  infarct, old No significant change since last tracing   11/11/14 CXR  IMPRESSION: 1. Minimal bibasilar atelectasis noted. Mild cardiomegaly seen. 2. 1.1 cm nodule noted at the right lung base.  Assessment/Plan No problem-specific assessment & plan notes found for this encounter.   Family/ Staff Communication: observe the patient  Goals of Care: SNF  Labs/tests ordered: none

## 2014-12-12 NOTE — Assessment & Plan Note (Signed)
Fully treated and asymptomatic presently.  

## 2014-12-12 NOTE — Assessment & Plan Note (Signed)
Takes Aricept, requires SNF for care needs. Progressing gradually-difficulty of showering-Lorazepam prn prior to shower needed. 10/24/14 MMSE 19/30

## 2014-12-12 NOTE — Assessment & Plan Note (Signed)
Controlled and takes Amlodipine 10mg  daily

## 2014-12-12 NOTE — Assessment & Plan Note (Signed)
Stable on MiraLax daily.  

## 2015-03-05 ENCOUNTER — Non-Acute Institutional Stay (SKILLED_NURSING_FACILITY): Payer: Medicare Other | Admitting: Nurse Practitioner

## 2015-03-05 ENCOUNTER — Encounter: Payer: Self-pay | Admitting: Nurse Practitioner

## 2015-03-05 DIAGNOSIS — E039 Hypothyroidism, unspecified: Secondary | ICD-10-CM | POA: Diagnosis not present

## 2015-03-05 DIAGNOSIS — I10 Essential (primary) hypertension: Secondary | ICD-10-CM

## 2015-03-05 DIAGNOSIS — F329 Major depressive disorder, single episode, unspecified: Secondary | ICD-10-CM

## 2015-03-05 DIAGNOSIS — K5900 Constipation, unspecified: Secondary | ICD-10-CM | POA: Diagnosis not present

## 2015-03-05 DIAGNOSIS — F32A Depression, unspecified: Secondary | ICD-10-CM

## 2015-03-05 DIAGNOSIS — F0391 Unspecified dementia with behavioral disturbance: Secondary | ICD-10-CM

## 2015-03-05 NOTE — Assessment & Plan Note (Signed)
Her mood is stable, but has mild weight loss about #1 Ib per month for the past 3 months, continue Celexa 20mg  and off Remeron 7.5mg  at night, continue to observe weights.

## 2015-03-05 NOTE — Progress Notes (Signed)
Patient ID: Jasmin Rodriguez, female   DOB: March 07, 1914, 81 y.o.   MRN: [id number]  Location:  SNF [facility] Provider:  Marlana Latus NP  Code Status:  DNR Goals of care: Advanced Directive information    Chief Complaint  Patient presents with  . Medical Management of Chronic Issues  . Acute Visit    weight loss     HPI: Patient is a 81 y.o. female seen in the SNF at [facility] today for evaluation of chronic medical conditions. Staff reported the patient has weight loss recently, record showed about #1 Ib per month for the past 3 months, she has showed no change in her mood while taking Celexa 20mg , denied GI symptoms. Her blood pressure has been controlled except occasionally SBP in 150-160s wo c/o HA, chest pressure or pain or discomfort, or vision changes.   Review of Systems:  Review of Systems  Constitutional: Positive for weight loss. Negative for fever, chills and diaphoresis.       About #1Ib a month in the past 3 months.   HENT: Positive for hearing loss. Negative for congestion, ear discharge, ear pain and nosebleeds.   Eyes: Negative for pain, discharge and redness.  Respiratory: Negative for cough and shortness of breath.   Cardiovascular: Positive for leg swelling. Negative for chest pain and palpitations.       Trace only in BLE  Gastrointestinal: Negative for nausea, vomiting and constipation.  Genitourinary: Positive for frequency. Negative for dysuria and urgency.  Musculoskeletal: Negative for myalgias, back pain and neck pain.  Skin: Negative for rash.  Neurological: Negative for dizziness, tremors, seizures, weakness and headaches.  Psychiatric/Behavioral: Negative for suicidal ideas and hallucinations. The patient is not nervous/anxious.     Past Medical History  Diagnosis Date  . Hypertension   . Anemia   . Hypothyroidism   . Hyperlipidemia   . Osteopenia   . Radial fracture   . Femur fracture   . Dementia 10/12/2012  . Unstable gait 04/16/2014    . Edema 11/10/2012  . Unspecified constipation 01/02/2013  . Skin cancer 05/01/14    left calf  . Mitral valve prolapse   . Vitamin D deficiency   . SCC (squamous cell carcinoma), leg 04/12/2014    04/24/14 general surgeon: likely SCC to the left lower leg: non operative treatment. Refer to dermatology to see there is any topical options available. May consider regional block and excision if there are no topical options.  05/01/14 Dermatology L calf lesion-biopsy. 1120/15: well differentiated invasive Squamous Cell Carcinoma. Mohs vs RT-Dr. Harvel Quale 05/21/14 06/11/14 MOHS 07/02/13     Patient Active Problem List   Diagnosis Date Noted  . Infection of urinary tract 11/12/2014  . Left shoulder pain 07/09/2014  . Unstable gait 04/16/2014  . SCC (squamous cell carcinoma), leg 04/12/2014  . DNR (do not resuscitate) 01/02/2013  . Constipation 01/02/2013  . Edema 11/10/2012  . Dementia 10/12/2012  . Depression 10/12/2012  . Closed right hip fracture 12/02/2011  . HTN (hypertension) 12/02/2011  . Hypothyroidism 12/02/2011    Allergies  Allergen Reactions  . Tramadol Other (See Comments)    Listed on MAR  . Zoloft [Sertraline Hcl] Other (See Comments)    Listed on MAR    Medications: Patient's Medications  New Prescriptions   No medications on file  Previous Medications   AMLODIPINE (NORVASC) 10 MG TABLET    Take 10 mg by mouth daily.   CITALOPRAM (CELEXA) 20 MG TABLET  Take 20 mg by mouth daily.   CITALOPRAM (CELEXA) 40 MG TABLET    Take 0.5 tablets (20 mg total) by mouth every morning.   DONEPEZIL (ARICEPT) 10 MG TABLET    Take 10 mg by mouth at bedtime.   LEVOTHYROXINE (SYNTHROID, LEVOTHROID) 100 MCG TABLET    Take 100 mcg by mouth daily at 6 (six) AM. **Check pulse weekly on Monday**   LORAZEPAM (ATIVAN) 0.5 MG TABLET    Take 0.5 mg by mouth 2 (two) times a week. Take 1 tablet (0.5 mg) prior to bath (shower) on Tuesday and Friday   MULTIPLE VITAMIN (THERA/BETA-CAROTENE) TABS     Take 1 tablet by mouth daily at 12 noon.    NITROGLYCERIN (NITROSTAT) 0.4 MG SL TABLET    Place 0.4 mg under the tongue every 5 (five) minutes as needed for chest pain.   NUTRITIONAL SUPPLEMENTS (BOOST BREEZE) LIQD    Take 237 mLs by mouth 2 (two) times daily.   POLYETHYLENE GLYCOL POWDER (GLYCOLAX/MIRALAX) POWDER    Take 17 g by mouth daily. Mix in 4-8 oz of fluid and drink  Modified Medications   No medications on file  Discontinued Medications   No medications on file    Physical Exam: Filed Vitals:   03/05/15 1202  BP: 160/80  Pulse: 78  Temp: 98.6 F (37 C)  TempSrc: Tympanic  Resp: 18   There is no weight on file to calculate BMI.  Physical Exam  Constitutional: She is oriented to person, place, and time.  Frail. Elderly.  HENT:  Head: Normocephalic.  Right Ear: External ear normal.  Left Ear: External ear normal.  Mouth/Throat: No oropharyngeal exudate.  Torus mandibularis.   Eyes: Conjunctivae and EOM are normal. Pupils are equal, round, and reactive to light.  Neck: No JVD present. No tracheal deviation present. No thyromegaly present.  Cardiovascular: Normal rate, regular rhythm, normal heart sounds and intact distal pulses.  Exam reveals no gallop.   No murmur heard. Pulmonary/Chest: No respiratory distress. She has no wheezes. She has rales. She exhibits no tenderness.  Abdominal: She exhibits no distension and no mass. There is no tenderness.  Musculoskeletal: Normal range of motion. She exhibits edema and tenderness.  Unstable gait. Using walker. Left shoulder pain with decreased ROM. Trace edema in ankles.   Lymphadenopathy:    She has no cervical adenopathy.  Neurological: She is alert and oriented to person, place, and time. No cranial nerve deficit.  Skin: No rash noted. No erythema. No pallor.  Psychiatric: She has a normal mood and affect. Her speech is normal and behavior is normal. Judgment and thought content normal. Cognition and memory are impaired.  She exhibits abnormal recent memory and abnormal remote memory.    Labs reviewed: Basic Metabolic Panel:  Recent Labs  09/04/14 11/11/14 2037  NA 139 138  K 3.6 3.6  CL  --  99*  CO2  --  27  GLUCOSE  --  124*  BUN 17 16  CREATININE 1.1 1.37*  CALCIUM  --  9.2    Liver Function Tests:  Recent Labs  09/04/14  AST 17  ALT 8  ALKPHOS 55    CBC:  Recent Labs  09/04/14 11/11/14 2037  WBC 3.9 15.3*  HGB 11.5* 12.8  HCT 36 39.3  MCV  --  90.6  PLT 222 236    Lab Results  Component Value Date   TSH 4.89 09/04/2014   No results found for: HGBA1C No results found  for: CHOL, HDL, LDLCALC, LDLDIRECT, TRIG, CHOLHDL  Significant Diagnostic Results since last visit: none  Patient Care Team: Estill Dooms, MD as PCP - General (Internal Medicine) Gaynelle Arabian, MD as Consulting Physician (Orthopedic Surgery) Calvert Cantor, MD as Consulting Physician (Ophthalmology) Vania Rea, MD as Consulting Physician (Obstetrics and Gynecology)  Assessment/Plan Problem List Items Addressed This Visit    HTN (hypertension) (Chronic)    Allow permissive bp control with SBP in 150-160s, she is asymptomatic, continue Amlodipine 10mg  daily      Hypothyroidism (Chronic)    Continue Levothyroxine 140mcg. TSH 2.685 09/26/13 4.893 09/04/14      Dementia (Chronic)    continue Aricept, requires SNF for care needs. Progressing gradually-difficulty of showering-Lorazepam prn prior to shower needed. 10/24/14 MMSE 19/30      Depression - Primary    Her mood is stable, but has mild weight loss about #1 Ib per month for the past 3 months, continue Celexa 20mg  and off Remeron 7.5mg  at night, continue to observe weights.       Constipation    Stable on MiraLax daily.          Family/ staff Communication: supportive care, observe weight.   Labs/tests ordered:  none  ManXie Khy Pitre NP [facility] [address], [place] [address] On  Call:  [phone] & follow prompts after 5pm & weekends Office Phone:  [phone] Office Fax:  [phone]

## 2015-03-05 NOTE — Assessment & Plan Note (Signed)
continue Aricept, requires SNF for care needs. Progressing gradually-difficulty of showering-Lorazepam prn prior to shower needed. 10/24/14 MMSE 19/30

## 2015-03-05 NOTE — Assessment & Plan Note (Signed)
Stable on MiraLax daily.  

## 2015-03-05 NOTE — Assessment & Plan Note (Signed)
Continue Levothyroxine 100mcg. TSH 2.685 09/26/13 4.893 09/04/14  

## 2015-03-05 NOTE — Assessment & Plan Note (Signed)
Allow permissive bp control with SBP in 150-160s, she is asymptomatic, continue Amlodipine 10mg  daily

## 2015-04-02 ENCOUNTER — Encounter (HOSPITAL_COMMUNITY): Payer: Self-pay | Admitting: Emergency Medicine

## 2015-04-02 ENCOUNTER — Emergency Department (HOSPITAL_COMMUNITY): Payer: Medicare Other

## 2015-04-02 ENCOUNTER — Inpatient Hospital Stay (HOSPITAL_COMMUNITY)
Admission: EM | Admit: 2015-04-02 | Discharge: 2015-04-05 | DRG: 184 | Disposition: A | Discharge status: Skilled Nursing Facility | Payer: Medicare Other | Attending: Internal Medicine | Admitting: Internal Medicine

## 2015-04-02 DIAGNOSIS — I10 Essential (primary) hypertension: Secondary | ICD-10-CM | POA: Diagnosis present

## 2015-04-02 DIAGNOSIS — Z85828 Personal history of other malignant neoplasm of skin: Secondary | ICD-10-CM | POA: Diagnosis not present

## 2015-04-02 DIAGNOSIS — E876 Hypokalemia: Secondary | ICD-10-CM | POA: Diagnosis present

## 2015-04-02 DIAGNOSIS — E039 Hypothyroidism, unspecified: Secondary | ICD-10-CM | POA: Diagnosis present

## 2015-04-02 DIAGNOSIS — N390 Urinary tract infection, site not specified: Secondary | ICD-10-CM | POA: Diagnosis present

## 2015-04-02 DIAGNOSIS — Z96649 Presence of unspecified artificial hip joint: Secondary | ICD-10-CM | POA: Diagnosis present

## 2015-04-02 DIAGNOSIS — Y92009 Unspecified place in unspecified non-institutional (private) residence as the place of occurrence of the external cause: Secondary | ICD-10-CM | POA: Diagnosis not present

## 2015-04-02 DIAGNOSIS — R54 Age-related physical debility: Secondary | ICD-10-CM | POA: Diagnosis present

## 2015-04-02 DIAGNOSIS — M8580 Other specified disorders of bone density and structure, unspecified site: Secondary | ICD-10-CM | POA: Diagnosis present

## 2015-04-02 DIAGNOSIS — S2239XA Fracture of one rib, unspecified side, initial encounter for closed fracture: Secondary | ICD-10-CM

## 2015-04-02 DIAGNOSIS — N183 Chronic kidney disease, stage 3 unspecified: Secondary | ICD-10-CM | POA: Diagnosis present

## 2015-04-02 DIAGNOSIS — Z66 Do not resuscitate: Secondary | ICD-10-CM | POA: Diagnosis present

## 2015-04-02 DIAGNOSIS — S2241XA Multiple fractures of ribs, right side, initial encounter for closed fracture: Principal | ICD-10-CM | POA: Diagnosis present

## 2015-04-02 DIAGNOSIS — S2231XA Fracture of one rib, right side, initial encounter for closed fracture: Secondary | ICD-10-CM | POA: Diagnosis not present

## 2015-04-02 DIAGNOSIS — E785 Hyperlipidemia, unspecified: Secondary | ICD-10-CM | POA: Diagnosis present

## 2015-04-02 DIAGNOSIS — F0390 Unspecified dementia without behavioral disturbance: Secondary | ICD-10-CM | POA: Diagnosis present

## 2015-04-02 DIAGNOSIS — W19XXXA Unspecified fall, initial encounter: Secondary | ICD-10-CM | POA: Diagnosis present

## 2015-04-02 DIAGNOSIS — I129 Hypertensive chronic kidney disease with stage 1 through stage 4 chronic kidney disease, or unspecified chronic kidney disease: Secondary | ICD-10-CM | POA: Diagnosis present

## 2015-04-02 DIAGNOSIS — Z79899 Other long term (current) drug therapy: Secondary | ICD-10-CM

## 2015-04-02 DIAGNOSIS — R627 Adult failure to thrive: Secondary | ICD-10-CM | POA: Diagnosis present

## 2015-04-02 DIAGNOSIS — F0391 Unspecified dementia with behavioral disturbance: Secondary | ICD-10-CM | POA: Diagnosis not present

## 2015-04-02 DIAGNOSIS — E559 Vitamin D deficiency, unspecified: Secondary | ICD-10-CM | POA: Diagnosis present

## 2015-04-02 DIAGNOSIS — Z888 Allergy status to other drugs, medicaments and biological substances status: Secondary | ICD-10-CM | POA: Diagnosis not present

## 2015-04-02 DIAGNOSIS — Z79891 Long term (current) use of opiate analgesic: Secondary | ICD-10-CM | POA: Diagnosis not present

## 2015-04-02 DIAGNOSIS — S2249XA Multiple fractures of ribs, unspecified side, initial encounter for closed fracture: Secondary | ICD-10-CM

## 2015-04-02 DIAGNOSIS — I341 Nonrheumatic mitral (valve) prolapse: Secondary | ICD-10-CM | POA: Diagnosis present

## 2015-04-02 DIAGNOSIS — R109 Unspecified abdominal pain: Secondary | ICD-10-CM

## 2015-04-02 LAB — CBC WITH DIFFERENTIAL/PLATELET
BASOS ABS: 0 10*3/uL (ref 0.0–0.1)
BASOS PCT: 0 %
EOS ABS: 0.1 10*3/uL (ref 0.0–0.7)
Eosinophils Relative: 0 %
HCT: 35.3 % — ABNORMAL LOW (ref 36.0–46.0)
Hemoglobin: 11.9 g/dL — ABNORMAL LOW (ref 12.0–15.0)
Lymphocytes Relative: 7 %
Lymphs Abs: 1.4 10*3/uL (ref 0.7–4.0)
MCH: 30 pg (ref 26.0–34.0)
MCHC: 33.7 g/dL (ref 30.0–36.0)
MCV: 88.9 fL (ref 78.0–100.0)
MONO ABS: 1.3 10*3/uL — AB (ref 0.1–1.0)
MONOS PCT: 7 %
NEUTROS PCT: 86 %
Neutro Abs: 16.9 10*3/uL — ABNORMAL HIGH (ref 1.7–7.7)
Platelets: 203 10*3/uL (ref 150–400)
RBC: 3.97 MIL/uL (ref 3.87–5.11)
RDW: 13.3 % (ref 11.5–15.5)
WBC: 19.8 10*3/uL — ABNORMAL HIGH (ref 4.0–10.5)

## 2015-04-02 LAB — URINALYSIS, ROUTINE W REFLEX MICROSCOPIC
BILIRUBIN URINE: NEGATIVE
Glucose, UA: NEGATIVE mg/dL
KETONES UR: NEGATIVE mg/dL
NITRITE: POSITIVE — AB
PROTEIN: 30 mg/dL — AB
Specific Gravity, Urine: 1.015 (ref 1.005–1.030)
UROBILINOGEN UA: 1 mg/dL (ref 0.0–1.0)
pH: 7 (ref 5.0–8.0)

## 2015-04-02 LAB — I-STAT CHEM 8, ED
BUN: 21 mg/dL — ABNORMAL HIGH (ref 6–20)
Calcium, Ion: 1.13 mmol/L (ref 1.13–1.30)
Chloride: 97 mmol/L — ABNORMAL LOW (ref 101–111)
Creatinine, Ser: 1.4 mg/dL — ABNORMAL HIGH (ref 0.44–1.00)
Glucose, Bld: 151 mg/dL — ABNORMAL HIGH (ref 65–99)
HEMATOCRIT: 39 % (ref 36.0–46.0)
HEMOGLOBIN: 13.3 g/dL (ref 12.0–15.0)
Potassium: 2.7 mmol/L — CL (ref 3.5–5.1)
SODIUM: 138 mmol/L (ref 135–145)
TCO2: 30 mmol/L (ref 0–100)

## 2015-04-02 LAB — URINE MICROSCOPIC-ADD ON

## 2015-04-02 NOTE — ED Notes (Signed)
Patient was walking and using walker. Patient was found laying on her right side with an abrasion to right elbow with bruising. Swelling to right clavicle with a deformity. Patient is having pain when moved. Patient is has dementia. Fall happened around 1830.

## 2015-04-02 NOTE — Progress Notes (Signed)
Attempted report x 1. Please call [phone]. Thanks, Chares Slaymaker, RN

## 2015-04-02 NOTE — ED Notes (Signed)
Bed: WA02 Expected date:  Expected time:  Means of arrival:  Comments: EMS 81 yo female unwitnessed fall-found on ground on right side-deformity right collar bone/dementia

## 2015-04-02 NOTE — Consult Note (Signed)
Reason for Consult: Right rib fractures after fall from level ground Referring Physician: Dr. Pickering  Jasmin Rodriguez is an 81 y.o. female.  HPI: this is a 81 year old female resident of a nursing home who apparently fell onto her right side. She is brought to the emergency department she has some right chest pain and chest wall tenderness. X-rays demonstrated minimally displaced fractures of the right first second and third ribs.  No widening of the mediastinum. Aortic notch is well seen. She also has some discomfort in the right clavicular area. No shortness of breath. Family member is present in the room with her.  Past Medical History  Diagnosis Date  . Hypertension   . Anemia   . Hypothyroidism   . Hyperlipidemia   . Osteopenia   . Radial fracture   . Femur fracture ([place])   . Dementia 10/12/2012  . Unstable gait 04/16/2014  . Edema 11/10/2012  . Unspecified constipation 01/02/2013  . Skin cancer 05/01/14    left calf  . Mitral valve prolapse   . Vitamin D deficiency   . SCC (squamous cell carcinoma), leg 04/12/2014    04/24/14 general surgeon: likely SCC to the left lower leg: non operative treatment. Refer to dermatology to see there is any topical options available. May consider regional block and excision if there are no topical options.  05/01/14 Dermatology L calf lesion-biopsy. 1120/15: well differentiated invasive Squamous Cell Carcinoma. Mohs vs RT-Dr. Harvel Quale 05/21/14 06/11/14 MOHS 07/02/13     Past Surgical History  Procedure Laterality Date  . Total hip arthroplasty  2013  . Abdominal hysterectomy    . Skin biopsy Left 05/01/14    Left calf    History reviewed. No pertinent family history.  Social History:  reports that she has never smoked. She has never used smokeless tobacco. She reports that she does not drink alcohol or use illicit drugs.  Allergies:  Allergies  Allergen Reactions  . Tramadol Other (See Comments)    Listed on MAR  . Zoloft [Sertraline Hcl]  Other (See Comments)    Listed on MAR    Prior to Admission medications   Medication Sig Start Date End Date Taking? Authorizing Provider  amLODipine (NORVASC) 10 MG tablet Take 10 mg by mouth daily.   Yes Historical Provider, MD  citalopram (CELEXA) 20 MG tablet Take 20 mg by mouth daily. 10/04/14  Yes Historical Provider, MD  donepezil (ARICEPT) 10 MG tablet Take 10 mg by mouth at bedtime.   Yes Historical Provider, MD  levothyroxine (SYNTHROID, LEVOTHROID) 100 MCG tablet Take 100 mcg by mouth daily at 6 (six) AM. **Check pulse weekly on Monday**   Yes Historical Provider, MD  Multiple Vitamin (THERA/BETA-CAROTENE) TABS Take 1 tablet by mouth daily at 12 noon.    Yes Historical Provider, MD  Nutritional Supplements (BOOST BREEZE) LIQD Take 237 mLs by mouth 2 (two) times daily.   Yes Historical Provider, MD  polyethylene glycol powder (GLYCOLAX/MIRALAX) powder Take 17 g by mouth daily. Mix in 4-8 oz of fluid and drink 09/23/14  Yes Historical Provider, MD  citalopram (CELEXA) 40 MG tablet Take 0.5 tablets (20 mg total) by mouth every morning. Patient not taking: Reported on 11/11/2014 11/08/13   Man Mast X, NP  nitroGLYCERIN (NITROSTAT) 0.4 MG SL tablet Place 0.4 mg under the tongue every 5 (five) minutes as needed for chest pain.    Historical Provider, MD     Results for orders placed or performed during the hospital encounter of  04/02/15 (from the past 48 hour(s))  Urinalysis, Routine w reflex microscopic     Status: Abnormal   Collection Time: 04/02/15  9:23 PM  Result Value Ref Range   Color, Urine YELLOW YELLOW   APPearance CLOUDY (A) CLEAR   Specific Gravity, Urine 1.015 1.005 - 1.030   pH 7.0 5.0 - 8.0   Glucose, UA NEGATIVE NEGATIVE mg/dL   Hgb urine dipstick TRACE (A) NEGATIVE   Bilirubin Urine NEGATIVE NEGATIVE   Ketones, ur NEGATIVE NEGATIVE mg/dL   Protein, ur 30 (A) NEGATIVE mg/dL   Urobilinogen, UA 1.0 0.0 - 1.0 mg/dL   Nitrite POSITIVE (A) NEGATIVE   Leukocytes, UA  MODERATE (A) NEGATIVE  Urine microscopic-add on     Status: Abnormal   Collection Time: 04/02/15  9:23 PM  Result Value Ref Range   Squamous Epithelial / LPF MANY (A) RARE   WBC, UA 21-50 <3 WBC/hpf    Comment: WITH CLUMPS   Bacteria, UA MANY (A) RARE   Crystals CA OXALATE CRYSTALS (A) NEGATIVE  CBC with Differential     Status: Abnormal   Collection Time: 04/02/15  9:28 PM  Result Value Ref Range   WBC 19.8 (H) 4.0 - 10.5 K/uL   RBC 3.97 3.87 - 5.11 MIL/uL   Hemoglobin 11.9 (L) 12.0 - 15.0 g/dL   HCT 35.3 (L) 36.0 - 46.0 %   MCV 88.9 78.0 - 100.0 fL   MCH 30.0 26.0 - 34.0 pg   MCHC 33.7 30.0 - 36.0 g/dL   RDW 13.3 11.5 - 15.5 %   Platelets 203 150 - 400 K/uL   Neutrophils Relative % 86 %   Neutro Abs 16.9 (H) 1.7 - 7.7 K/uL   Lymphocytes Relative 7 %   Lymphs Abs 1.4 0.7 - 4.0 K/uL   Monocytes Relative 7 %   Monocytes Absolute 1.3 (H) 0.1 - 1.0 K/uL   Eosinophils Relative 0 %   Eosinophils Absolute 0.1 0.0 - 0.7 K/uL   Basophils Relative 0 %   Basophils Absolute 0.0 0.0 - 0.1 K/uL  I-stat Chem 8, ED     Status: Abnormal   Collection Time: 04/02/15  9:35 PM  Result Value Ref Range   Sodium 138 135 - 145 mmol/L   Potassium 2.7 (LL) 3.5 - 5.1 mmol/L   Chloride 97 (L) 101 - 111 mmol/L   BUN 21 (H) 6 - 20 mg/dL   Creatinine, Ser 1.40 (H) 0.44 - 1.00 mg/dL   Glucose, Bld 151 (H) 65 - 99 mg/dL   Calcium, Ion 1.13 1.13 - 1.30 mmol/L   TCO2 30 0 - 100 mmol/L   Hemoglobin 13.3 12.0 - 15.0 g/dL   HCT 39.0 36.0 - 46.0 %    Dg Ribs Unilateral W/chest Right  04/02/2015   CLINICAL DATA:  Fall.  Right chest pain.  EXAM: RIGHT RIBS AND CHEST - 3+ VIEW  COMPARISON:  11/11/2014  FINDINGS: The heart is moderately enlarged. Linear scar versus atelectasis in the inferior right upper lobe. Low lung volumes. No pneumothorax. Normal vascularity.  Acute minimally displaced fractures of the right first second and third ribs are noted. The right fifth rib fracture is chronic and shows evidence of  healing. Chronic left rib deformities. Osteopenia.  IMPRESSION: Acute right first second and third rib fractures.  Cardiomegaly without decompensation.   Electronically Signed   By: Marybelle Killings M.D.   On: 04/02/2015 21:58   Dg Shoulder Right  04/02/2015   CLINICAL DATA:  Fall.  EXAM: RIGHT SHOULDER - 2+ VIEW  COMPARISON:  None.  FINDINGS: Proximal right humerus and glenohumeral joint appear normal. Clavicle appears normal. Several mildly displaced right upper rib fractures are noted.  IMPRESSION: Several mildly displaced right upper rib fractures are noted. Glenohumeral joint and proximal right humerus appear normal.   Electronically Signed   By: Marijo Conception, M.D.   On: 04/02/2015 20:45   Dg Elbow 2 Views Left  04/02/2015   CLINICAL DATA:  Possible fall  EXAM: LEFT ELBOW - 2 VIEW  COMPARISON:  None.  FINDINGS: Osteopenia.  No acute fracture.  No dislocation.  IMPRESSION: No acute bony pathology   Electronically Signed   By: Marybelle Killings M.D.   On: 04/02/2015 20:42   Dg Hip Unilat  With Pelvis 2-3 Views Right  04/02/2015   CLINICAL DATA:  Possible fall  EXAM: DG HIP (WITH OR WITHOUT PELVIS) 2-3V RIGHT  COMPARISON:  12/02/2011  FINDINGS: Stabilization hardware is present in both proximal femurs. There is no breakage or loosening of the hardware. Osteopenia. No definite acute fracture or dislocation. Vascular calcifications are noted.  IMPRESSION: No acute bony pathology.   Electronically Signed   By: Marybelle Killings M.D.   On: 04/02/2015 20:41    Review of Systems  Respiratory: Negative for shortness of breath.   Cardiovascular: Positive for chest pain (right lateral chest wall pain.).  Gastrointestinal: Negative for abdominal pain.  Musculoskeletal:       Right lateral hip pain.  Right elbow pain.   Blood pressure 138/109, pulse 67, resp. rate 14, SpO2 99 %. Physical Exam  Constitutional:  Elderly female in no acute distress.  HENT:  Head: Normocephalic and atraumatic.  Eyes: EOM are  normal.  Cardiovascular: Normal rate and regular rhythm.   Respiratory: Effort normal and breath sounds normal.  Right anterior lateral chest wall tenderness but no crepitus or ecchymosis.  GI: Soft. There is no tenderness.  Musculoskeletal:  Right elbow and ecchymosis present. No palpable bony deformity. Right lateral hip tenderness without any ecchymosis.  Neurological: She is alert.  Skin: Skin is warm and dry.    Assessment 1. Fall from level ground. 2. Right first to second and third rib fractures. 3. Dementia and other multiple medical problems.  Recommendation:  Admission to medical service and transfer to [facility] so she can be followed by the trauma service. Incentive spirometry. PT and OT consultation. Repeat portable chest x-ray tomorrow.  Cali Hope J 04/02/2015, 10:08 PM

## 2015-04-02 NOTE — ED Notes (Signed)
Placed pt on 2L nasal cannula due to sustained oxygen saturation of 88% on room air.

## 2015-04-02 NOTE — ED Notes (Signed)
Contact info is [facility] cell number is [phone] home number is [phone].

## 2015-04-02 NOTE — ED Provider Notes (Signed)
CSN: [id number]     Arrival date & time 04/02/15  1953 History   First MD Initiated Contact with Patient 04/02/15 2052     Chief Complaint  Patient presents with  . Fall    Level 5 caveat due to dementia. (Consider location/radiation/quality/duration/timing/severity/associated sxs/prior Treatment) Patient is a 81 y.o. female presenting with fall. The history is provided by the patient and a relative.  Fall This is a new problem.   patient had a fall this afternoon. Complaining of pain on her right chest and shoulder. No known loss conscious. Has dementia and cannot give much history. No abdominal pain. No difficulty breathing. She is not on anticoagulation.  Past Medical History  Diagnosis Date  . Hypertension   . Anemia   . Hypothyroidism   . Hyperlipidemia   . Osteopenia   . Radial fracture   . Femur fracture ([place])   . Dementia 10/12/2012  . Unstable gait 04/16/2014  . Edema 11/10/2012  . Unspecified constipation 01/02/2013  . Skin cancer 05/01/14    left calf  . Mitral valve prolapse   . Vitamin D deficiency   . SCC (squamous cell carcinoma), leg 04/12/2014    04/24/14 general surgeon: likely SCC to the left lower leg: non operative treatment. Refer to dermatology to see there is any topical options available. May consider regional block and excision if there are no topical options.  05/01/14 Dermatology L calf lesion-biopsy. 1120/15: well differentiated invasive Squamous Cell Carcinoma. Mohs vs RT-Dr. Harvel Quale 05/21/14 06/11/14 MOHS 07/02/13    Past Surgical History  Procedure Laterality Date  . Total hip arthroplasty  2013  . Abdominal hysterectomy    . Skin biopsy Left 05/01/14    Left calf   History reviewed. No pertinent family history. Social History  Substance Use Topics  . Smoking status: Never Smoker   . Smokeless tobacco: Never Used  . Alcohol Use: No   OB History    No data available     Review of Systems  Unable to perform ROS     Allergies  Tramadol  and Zoloft  Home Medications   Prior to Admission medications   Medication Sig Start Date End Date Taking? Authorizing Provider  amLODipine (NORVASC) 10 MG tablet Take 10 mg by mouth daily.   Yes Historical Provider, MD  citalopram (CELEXA) 20 MG tablet Take 20 mg by mouth daily. 10/04/14  Yes Historical Provider, MD  donepezil (ARICEPT) 10 MG tablet Take 10 mg by mouth at bedtime.   Yes Historical Provider, MD  levothyroxine (SYNTHROID, LEVOTHROID) 100 MCG tablet Take 100 mcg by mouth daily at 6 (six) AM. **Check pulse weekly on Monday**   Yes Historical Provider, MD  Multiple Vitamin (THERA/BETA-CAROTENE) TABS Take 1 tablet by mouth daily at 12 noon.    Yes Historical Provider, MD  Nutritional Supplements (BOOST BREEZE) LIQD Take 237 mLs by mouth 2 (two) times daily.   Yes Historical Provider, MD  polyethylene glycol powder (GLYCOLAX/MIRALAX) powder Take 17 g by mouth daily. Mix in 4-8 oz of fluid and drink 09/23/14  Yes Historical Provider, MD  citalopram (CELEXA) 40 MG tablet Take 0.5 tablets (20 mg total) by mouth every morning. Patient not taking: Reported on 11/11/2014 11/08/13   Man Mast X, NP  nitroGLYCERIN (NITROSTAT) 0.4 MG SL tablet Place 0.4 mg under the tongue every 5 (five) minutes as needed for chest pain.    Historical Provider, MD   BP 138/109 mmHg  Pulse 67  Resp 14  SpO2  99% Physical Exam  Constitutional: She appears well-developed and well-nourished.  HENT:  Head: Atraumatic.  Neck: Neck supple.  Cardiovascular: Normal rate.   Pulmonary/Chest: No respiratory distress.  Tenderness to right upper lateral chest wall. No subcutaneous emphysema.  Abdominal: She exhibits no distension. There is no tenderness.  Musculoskeletal:  No tenderness to right elbow but there is some ecchymosis. No tenderness over left upper extremity. Slight tenderness to right hip. No ecchymosis. Range of motion intact at baseline over both hips.  Neurological:  Patient is at her baseline dementia  per son.    ED Course  Procedures (including critical care time) Labs Review Labs Reviewed  CBC WITH DIFFERENTIAL/PLATELET - Abnormal; Notable for the following:    WBC 19.8 (*)    Hemoglobin 11.9 (*)    HCT 35.3 (*)    Neutro Abs 16.9 (*)    Monocytes Absolute 1.3 (*)    All other components within normal limits  URINALYSIS, ROUTINE W REFLEX MICROSCOPIC (NOT AT [facility]) - Abnormal; Notable for the following:    APPearance CLOUDY (*)    Hgb urine dipstick TRACE (*)    Protein, ur 30 (*)    Nitrite POSITIVE (*)    Leukocytes, UA MODERATE (*)    All other components within normal limits  URINE MICROSCOPIC-ADD ON - Abnormal; Notable for the following:    Squamous Epithelial / LPF MANY (*)    Bacteria, UA MANY (*)    Crystals CA OXALATE CRYSTALS (*)    All other components within normal limits  I-STAT CHEM 8, ED - Abnormal; Notable for the following:    Potassium 2.7 (*)    Chloride 97 (*)    BUN 21 (*)    Creatinine, Ser 1.40 (*)    Glucose, Bld 151 (*)    All other components within normal limits    Imaging Review Dg Ribs Unilateral W/chest Right  04/02/2015   CLINICAL DATA:  Fall.  Right chest pain.  EXAM: RIGHT RIBS AND CHEST - 3+ VIEW  COMPARISON:  11/11/2014  FINDINGS: The heart is moderately enlarged. Linear scar versus atelectasis in the inferior right upper lobe. Low lung volumes. No pneumothorax. Normal vascularity.  Acute minimally displaced fractures of the right first second and third ribs are noted. The right fifth rib fracture is chronic and shows evidence of healing. Chronic left rib deformities. Osteopenia.  IMPRESSION: Acute right first second and third rib fractures.  Cardiomegaly without decompensation.   Electronically Signed   By: Marybelle Killings M.D.   On: 04/02/2015 21:58   Dg Shoulder Right  04/02/2015   CLINICAL DATA:  Fall.  EXAM: RIGHT SHOULDER - 2+ VIEW  COMPARISON:  None.  FINDINGS: Proximal right humerus and glenohumeral joint appear normal. Clavicle  appears normal. Several mildly displaced right upper rib fractures are noted.  IMPRESSION: Several mildly displaced right upper rib fractures are noted. Glenohumeral joint and proximal right humerus appear normal.   Electronically Signed   By: Marijo Conception, M.D.   On: 04/02/2015 20:45   Dg Elbow 2 Views Left  04/02/2015   CLINICAL DATA:  Possible fall  EXAM: LEFT ELBOW - 2 VIEW  COMPARISON:  None.  FINDINGS: Osteopenia.  No acute fracture.  No dislocation.  IMPRESSION: No acute bony pathology   Electronically Signed   By: Marybelle Killings M.D.   On: 04/02/2015 20:42   Dg Hip Unilat  With Pelvis 2-3 Views Right  04/02/2015   CLINICAL DATA:  Possible fall  EXAM: DG HIP (WITH OR WITHOUT PELVIS) 2-3V RIGHT  COMPARISON:  12/02/2011  FINDINGS: Stabilization hardware is present in both proximal femurs. There is no breakage or loosening of the hardware. Osteopenia. No definite acute fracture or dislocation. Vascular calcifications are noted.  IMPRESSION: No acute bony pathology.   Electronically Signed   By: Marybelle Killings M.D.   On: 04/02/2015 20:41   I have personally reviewed and evaluated these images and lab results as part of my medical decision-making.   EKG Interpretation None      MDM   Final diagnoses:  Rib fractures  Urinary tract infection without hematuria, site unspecified    Patient with fall. Has 3 rib fractures and a room air pulse ox in the upper 80s. Will admit to internal medicine and has been seen by trauma surgery. Will transfer to [place], MD 04/02/15 2231

## 2015-04-03 ENCOUNTER — Encounter (HOSPITAL_COMMUNITY): Payer: Self-pay | Admitting: Internal Medicine

## 2015-04-03 ENCOUNTER — Inpatient Hospital Stay (HOSPITAL_COMMUNITY): Payer: Medicare Other

## 2015-04-03 DIAGNOSIS — N390 Urinary tract infection, site not specified: Secondary | ICD-10-CM

## 2015-04-03 DIAGNOSIS — I10 Essential (primary) hypertension: Secondary | ICD-10-CM | POA: Diagnosis present

## 2015-04-03 DIAGNOSIS — S2231XA Fracture of one rib, right side, initial encounter for closed fracture: Secondary | ICD-10-CM

## 2015-04-03 DIAGNOSIS — S2239XA Fracture of one rib, unspecified side, initial encounter for closed fracture: Secondary | ICD-10-CM | POA: Diagnosis present

## 2015-04-03 DIAGNOSIS — E876 Hypokalemia: Secondary | ICD-10-CM | POA: Diagnosis present

## 2015-04-03 DIAGNOSIS — N183 Chronic kidney disease, stage 3 unspecified: Secondary | ICD-10-CM | POA: Diagnosis present

## 2015-04-03 LAB — CBC WITH DIFFERENTIAL/PLATELET
BASOS ABS: 0 10*3/uL (ref 0.0–0.1)
Basophils Relative: 0 %
EOS PCT: 0 %
Eosinophils Absolute: 0 10*3/uL (ref 0.0–0.7)
HEMATOCRIT: 36 % (ref 36.0–46.0)
Hemoglobin: 12.2 g/dL (ref 12.0–15.0)
LYMPHS ABS: 1.2 10*3/uL (ref 0.7–4.0)
LYMPHS PCT: 8 %
MCH: 30.4 pg (ref 26.0–34.0)
MCHC: 33.9 g/dL (ref 30.0–36.0)
MCV: 89.8 fL (ref 78.0–100.0)
MONO ABS: 1 10*3/uL (ref 0.1–1.0)
MONOS PCT: 7 %
NEUTROS ABS: 12.7 10*3/uL — AB (ref 1.7–7.7)
Neutrophils Relative %: 85 %
PLATELETS: 198 10*3/uL (ref 150–400)
RBC: 4.01 MIL/uL (ref 3.87–5.11)
RDW: 13.5 % (ref 11.5–15.5)
WBC: 14.9 10*3/uL — ABNORMAL HIGH (ref 4.0–10.5)

## 2015-04-03 LAB — MAGNESIUM: Magnesium: 1.8 mg/dL (ref 1.7–2.4)

## 2015-04-03 LAB — COMPREHENSIVE METABOLIC PANEL
ALBUMIN: 3 g/dL — AB (ref 3.5–5.0)
ALT: 18 U/L (ref 14–54)
AST: 36 U/L (ref 15–41)
Alkaline Phosphatase: 64 U/L (ref 38–126)
Anion gap: 11 (ref 5–15)
BILIRUBIN TOTAL: 0.8 mg/dL (ref 0.3–1.2)
BUN: 18 mg/dL (ref 6–20)
CO2: 28 mmol/L (ref 22–32)
Calcium: 9.3 mg/dL (ref 8.9–10.3)
Chloride: 98 mmol/L — ABNORMAL LOW (ref 101–111)
Creatinine, Ser: 1.37 mg/dL — ABNORMAL HIGH (ref 0.44–1.00)
GFR calc Af Amer: 36 mL/min — ABNORMAL LOW (ref >60)
GFR calc non Af Amer: 31 mL/min — ABNORMAL LOW (ref >60)
GLUCOSE: 147 mg/dL — AB (ref 65–99)
POTASSIUM: 3.4 mmol/L — AB (ref 3.5–5.1)
SODIUM: 137 mmol/L (ref 135–145)
TOTAL PROTEIN: 6.7 g/dL (ref 6.5–8.1)

## 2015-04-03 MED ORDER — BOOST BREEZE PO LIQD: 237.0000 mL | Freq: Two times a day (BID) | ORAL | Status: DC

## 2015-04-03 MED ORDER — THERA/BETA-CAROTENE PO TABS: 1.0000 | ORAL_TABLET | Freq: Every day | ORAL | Status: DC

## 2015-04-03 MED ORDER — POLYETHYLENE GLYCOL 3350 17 G PO PACK
17.0000 g | PACK | Freq: Every day | ORAL | Status: DC
  Administered 2015-04-03 – 2015-04-05 (×3): 17 g via ORAL
  Filled 2015-04-03 (×3): qty 1

## 2015-04-03 MED ORDER — POTASSIUM CHLORIDE CRYS ER 20 MEQ PO TBCR
40.0000 meq | EXTENDED_RELEASE_TABLET | Freq: Once | ORAL | Status: AC
  Administered 2015-04-03: 40 meq via ORAL
  Filled 2015-04-03: qty 2

## 2015-04-03 MED ORDER — ACETAMINOPHEN 650 MG RE SUPP: 650.0000 mg | Freq: Four times a day (QID) | RECTAL | Status: DC | PRN

## 2015-04-03 MED ORDER — ACETAMINOPHEN 325 MG PO TABS
650.0000 mg | ORAL_TABLET | Freq: Four times a day (QID) | ORAL | Status: DC | PRN
  Administered 2015-04-03: 650 mg via ORAL
  Filled 2015-04-03: qty 2

## 2015-04-03 MED ORDER — POLYETHYLENE GLYCOL 3350 17 GM/SCOOP PO POWD: 17.0000 g | Freq: Every day | ORAL | Status: DC

## 2015-04-03 MED ORDER — CITALOPRAM HYDROBROMIDE 20 MG PO TABS
20.0000 mg | ORAL_TABLET | Freq: Every day | ORAL | Status: DC
  Administered 2015-04-03 – 2015-04-05 (×3): 20 mg via ORAL
  Filled 2015-04-03 (×3): qty 1

## 2015-04-03 MED ORDER — NITROGLYCERIN 0.4 MG SL SUBL
0.4000 mg | SUBLINGUAL_TABLET | SUBLINGUAL | Status: DC | PRN
Start: 2015-04-03 — End: 2015-04-06

## 2015-04-03 MED ORDER — LEVOTHYROXINE SODIUM 100 MCG PO TABS
100.0000 ug | ORAL_TABLET | Freq: Every day | ORAL | Status: DC
  Administered 2015-04-03 – 2015-04-05 (×3): 100 ug via ORAL
  Filled 2015-04-03 (×3): qty 1

## 2015-04-03 MED ORDER — SODIUM CHLORIDE 0.9 % IV SOLN
INTRAVENOUS | Status: DC
  Administered 2015-04-03: 11:00:00 via INTRAVENOUS

## 2015-04-03 MED ORDER — ONDANSETRON HCL 4 MG PO TABS: 4.0000 mg | ORAL_TABLET | Freq: Four times a day (QID) | ORAL | Status: DC | PRN

## 2015-04-03 MED ORDER — ADULT MULTIVITAMIN W/MINERALS CH
1.0000 | ORAL_TABLET | Freq: Every day | ORAL | Status: DC
  Administered 2015-04-03 – 2015-04-05 (×3): 1 via ORAL
  Filled 2015-04-03 (×3): qty 1

## 2015-04-03 MED ORDER — AMLODIPINE BESYLATE 10 MG PO TABS
10.0000 mg | ORAL_TABLET | Freq: Every day | ORAL | Status: DC
  Administered 2015-04-03 – 2015-04-05 (×3): 10 mg via ORAL
  Filled 2015-04-03 (×3): qty 1

## 2015-04-03 MED ORDER — POTASSIUM CHLORIDE CRYS ER 20 MEQ PO TBCR
40.0000 meq | EXTENDED_RELEASE_TABLET | Freq: Once | ORAL | Status: AC
  Administered 2015-04-03: 40 meq via ORAL

## 2015-04-03 MED ORDER — OXYCODONE HCL 5 MG PO TABS
2.5000 mg | ORAL_TABLET | Freq: Three times a day (TID) | ORAL | Status: DC | PRN
  Administered 2015-04-03 – 2015-04-05 (×5): 2.5 mg via ORAL
  Filled 2015-04-03 (×5): qty 1

## 2015-04-03 MED ORDER — BOOST / RESOURCE BREEZE PO LIQD
1.0000 | Freq: Two times a day (BID) | ORAL | Status: DC
  Administered 2015-04-03 – 2015-04-05 (×4): 1 via ORAL

## 2015-04-03 MED ORDER — DEXTROSE 5 % IV SOLN
1.0000 g | INTRAVENOUS | Status: DC
  Administered 2015-04-03 – 2015-04-05 (×3): 1 g via INTRAVENOUS
  Filled 2015-04-03 (×6): qty 10

## 2015-04-03 MED ORDER — DONEPEZIL HCL 10 MG PO TABS
10.0000 mg | ORAL_TABLET | Freq: Every day | ORAL | Status: DC
  Administered 2015-04-03 – 2015-04-04 (×2): 10 mg via ORAL
  Filled 2015-04-03 (×2): qty 1

## 2015-04-03 MED ORDER — ONDANSETRON HCL 4 MG/2ML IJ SOLN
4.0000 mg | Freq: Four times a day (QID) | INTRAMUSCULAR | Status: DC | PRN
Start: 2015-04-03 — End: 2015-04-06

## 2015-04-03 MED ORDER — HYDRALAZINE HCL 20 MG/ML IJ SOLN
10.0000 mg | INTRAMUSCULAR | Status: DC | PRN
  Administered 2015-04-03 – 2015-04-04 (×3): 10 mg via INTRAVENOUS
  Filled 2015-04-03 (×3): qty 1

## 2015-04-03 NOTE — Progress Notes (Signed)
Patient ID: Jasmin Rodriguez, female   DOB: 10/20/1913, 81 y.o.   MRN: [id number]   LOS: 1 day   Subjective: Minimal pain this am.   Objective: Vital signs in last 24 hours: Temp:  [98.2 F (36.8 C)-98.4 F (36.9 C)] 98.4 F (36.9 C) (10/05 0557) Pulse Rate:  [64-82] 77 (10/05 0557) Resp:  [14-18] 17 (10/05 0557) BP: (138-189)/(72-135) 184/74 mmHg (10/05 0557) SpO2:  [82 %-100 %] 100 % (10/05 0557) FiO2 (%):  [0 %] 0 % (10/05 0026) Weight:  [55.6 kg (122 lb 9.2 oz)] 55.6 kg (122 lb 9.2 oz) (10/05 0012) Last BM Date:  (prior to admission )   Laboratory  CBC  Recent Labs  04/02/15 2128 04/02/15 2135 04/03/15 0444  WBC 19.8*  --  14.9*  HGB 11.9* 13.3 12.2  HCT 35.3* 39.0 36.0  PLT 203  --  198   BMET  Recent Labs  04/02/15 2135 04/03/15 0444  NA 138 137  K 2.7* 3.4*  CL 97* 98*  CO2  --  28  GLUCOSE 151* 147*  BUN 21* 18  CREATININE 1.40* 1.37*  CALCIUM  --  9.3    Physical Exam General appearance: alert and no distress Resp: diminished breath sounds anterior - right Cardio: regular rate and rhythm   Assessment/Plan: Fall Right rib fx -- Pulmonary toilet Multiple medical problems -- Per primary service    Lisette Abu, PA-C Pager: [phone] General Trauma PA Pager: [phone]  04/03/2015

## 2015-04-03 NOTE — H&P (Signed)
[facility] History and Physical  Jasmin M Stepka XBD:[id number] DOB: Jan 02, 1914 DOA: 04/02/2015  Referring physician: Dr. Alvino Chapel. PCP: Estill Dooms, MD  Specialists: None.  Chief Complaint: Fall.  HPI: Jasmin Rodriguez is a 81 y.o. female with history of dementia, hypertension, chronic kidney disease, hypothyroidism was brought to the ER after patient had a fall at her living facility. Patient is unable to exactly explain how she fell but as per the report patient was walking when she suddenly fell. In the ER patient was found to have right-sided chest pain and x-rays revealed minimally displaced right-sided rib fracture. On call trauma surgeon Dr. Laney Potash was consulted and patient has been admitted for further observation. On my exam patient is not in distress and denies any chest pain or shortness of breath. Patient is able to move all extremities.   Review of Systems: As presented in the history of presenting illness, rest negative.  Past Medical History  Diagnosis Date  . Hypertension   . Anemia   . Hypothyroidism   . Hyperlipidemia   . Osteopenia   . Radial fracture   . Femur fracture ([place])   . Dementia 10/12/2012  . Unstable gait 04/16/2014  . Edema 11/10/2012  . Unspecified constipation 01/02/2013  . Skin cancer 05/01/14    left calf  . Mitral valve prolapse   . Vitamin D deficiency   . SCC (squamous cell carcinoma), leg 04/12/2014    04/24/14 general surgeon: likely SCC to the left lower leg: non operative treatment. Refer to dermatology to see there is any topical options available. May consider regional block and excision if there are no topical options.  05/01/14 Dermatology L calf lesion-biopsy. 1120/15: well differentiated invasive Squamous Cell Carcinoma. Mohs vs RT-Dr. Harvel Quale 05/21/14 06/11/14 MOHS 07/02/13    Past Surgical History  Procedure Laterality Date  . Total hip arthroplasty  2013  . Abdominal hysterectomy    . Skin biopsy Left 05/01/14   Left calf   Social History:  reports that she has never smoked. She has never used smokeless tobacco. She reports that she does not drink alcohol or use illicit drugs. Where does patient live friend's home. Living facility. Can patient participate in ADLs? No.  Allergies  Allergen Reactions  . Tramadol Other (See Comments)    Listed on MAR  . Zoloft [Sertraline Hcl] Other (See Comments)    Listed on MAR    Family History:  Family History  Problem Relation Age of Onset  . Dementia Neg Hx       Prior to Admission medications   Medication Sig Start Date End Date Taking? Authorizing Provider  amLODipine (NORVASC) 10 MG tablet Take 10 mg by mouth daily.   Yes Historical Provider, MD  citalopram (CELEXA) 20 MG tablet Take 20 mg by mouth daily. 10/04/14  Yes Historical Provider, MD  donepezil (ARICEPT) 10 MG tablet Take 10 mg by mouth at bedtime.   Yes Historical Provider, MD  levothyroxine (SYNTHROID, LEVOTHROID) 100 MCG tablet Take 100 mcg by mouth daily at 6 (six) AM. **Check pulse weekly on Monday**   Yes Historical Provider, MD  Multiple Vitamin (THERA/BETA-CAROTENE) TABS Take 1 tablet by mouth daily at 12 noon.    Yes Historical Provider, MD  Nutritional Supplements (BOOST BREEZE) LIQD Take 237 mLs by mouth 2 (two) times daily.   Yes Historical Provider, MD  polyethylene glycol powder (GLYCOLAX/MIRALAX) powder Take 17 g by mouth daily. Mix in 4-8 oz of fluid and drink 09/23/14  Yes  Historical Provider, MD  citalopram (CELEXA) 40 MG tablet Take 0.5 tablets (20 mg total) by mouth every morning. Patient not taking: Reported on 11/11/2014 11/08/13   Man Mast X, NP  nitroGLYCERIN (NITROSTAT) 0.4 MG SL tablet Place 0.4 mg under the tongue every 5 (five) minutes as needed for chest pain.    Historical Provider, MD    Physical Exam: Filed Vitals:   04/02/15 2009 04/02/15 2112 04/02/15 2315 04/03/15 0012  BP:  138/109 161/85 189/72  Pulse:  67 82 77  Temp:    98.2 F (36.8 C)  TempSrc:     Oral  Resp:  14 15 16   Weight:    55.6 kg (122 lb 9.2 oz)  SpO2: 88% 99% 82% 99%     General:  Moderately built and nourished.  Eyes: Anicteric no pallor.  ENT: No discharge from the ears eyes nose or mouth.  Neck: No mass felt.  Cardiovascular: S1-S2 heard.  Respiratory: No rhonchi or crepitations.  Abdomen: Soft nontender bowel sounds present.  Skin: No rash.  Musculoskeletal: No edema.  Psychiatric: Alert awake.  Neurologic: Alert awake oriented to her name. Moves all extremities.  Labs on Admission:  Basic Metabolic Panel:  Recent Labs Lab 04/02/15 2135  NA 138  K 2.7*  CL 97*  GLUCOSE 151*  BUN 21*  CREATININE 1.40*   Liver Function Tests: No results for input(s): AST, ALT, ALKPHOS, BILITOT, PROT, ALBUMIN in the last 168 hours. No results for input(s): LIPASE, AMYLASE in the last 168 hours. No results for input(s): AMMONIA in the last 168 hours. CBC:  Recent Labs Lab 04/02/15 2128 04/02/15 2135  WBC 19.8*  --   NEUTROABS 16.9*  --   HGB 11.9* 13.3  HCT 35.3* 39.0  MCV 88.9  --   PLT 203  --    Cardiac Enzymes: No results for input(s): CKTOTAL, CKMB, CKMBINDEX, TROPONINI in the last 168 hours.  BNP (last 3 results)  Recent Labs  11/11/14 2034  BNP 190.9*    ProBNP (last 3 results) No results for input(s): PROBNP in the last 8760 hours.  CBG: No results for input(s): GLUCAP in the last 168 hours.  Radiological Exams on Admission: Dg Ribs Unilateral W/chest Right  04/02/2015   CLINICAL DATA:  Fall.  Right chest pain.  EXAM: RIGHT RIBS AND CHEST - 3+ VIEW  COMPARISON:  11/11/2014  FINDINGS: The heart is moderately enlarged. Linear scar versus atelectasis in the inferior right upper lobe. Low lung volumes. No pneumothorax. Normal vascularity.  Acute minimally displaced fractures of the right first second and third ribs are noted. The right fifth rib fracture is chronic and shows evidence of healing. Chronic left rib deformities.  Osteopenia.  IMPRESSION: Acute right first second and third rib fractures.  Cardiomegaly without decompensation.   Electronically Signed   By: Marybelle Killings M.D.   On: 04/02/2015 21:58   Dg Shoulder Right  04/02/2015   CLINICAL DATA:  Fall.  EXAM: RIGHT SHOULDER - 2+ VIEW  COMPARISON:  None.  FINDINGS: Proximal right humerus and glenohumeral joint appear normal. Clavicle appears normal. Several mildly displaced right upper rib fractures are noted.  IMPRESSION: Several mildly displaced right upper rib fractures are noted. Glenohumeral joint and proximal right humerus appear normal.   Electronically Signed   By: Marijo Conception, M.D.   On: 04/02/2015 20:45   Dg Elbow 2 Views Left  04/02/2015   CLINICAL DATA:  Possible fall  EXAM: LEFT ELBOW - 2 VIEW  COMPARISON:  None.  FINDINGS: Osteopenia.  No acute fracture.  No dislocation.  IMPRESSION: No acute bony pathology   Electronically Signed   By: Marybelle Killings M.D.   On: 04/02/2015 20:42   Dg Hip Unilat  With Pelvis 2-3 Views Right  04/02/2015   CLINICAL DATA:  Possible fall  EXAM: DG HIP (WITH OR WITHOUT PELVIS) 2-3V RIGHT  COMPARISON:  12/02/2011  FINDINGS: Stabilization hardware is present in both proximal femurs. There is no breakage or loosening of the hardware. Osteopenia. No definite acute fracture or dislocation. Vascular calcifications are noted.  IMPRESSION: No acute bony pathology.   Electronically Signed   By: Marybelle Killings M.D.   On: 04/02/2015 20:41     Assessment/Plan Principal Problem:   Right rib fracture Active Problems:   Hypothyroidism   Dementia   Hypertension, uncontrolled   Hypokalemia   CKD (chronic kidney disease) stage 3, GFR 30-59 ml/min   Urinary tract infectious disease   Fracture, rib   1. Right-sided rib fractures status post fall - appreciate trauma surgery consult. Repeat chest x-ray has been ordered in a.m. Get physical therapy consult. 2. Leukocytosis probably reactionary and also UTI - follow urine cultures and  CBC. A shunt has been placed on ceftriaxone. 3. Hypertension uncontrolled - probably secondary to discomfort of fall and pain. In addition to home medication have placed patient on when necessary IV hydralazine. 4. Hypokalemia - replace and recheck and check magnesium. 5. Dementia - continue Aricept. 6. Chronic kidney disease stage III - follow metabolic panel. 7. Hypothyroidism on Synthroid.  I have reviewed patient's old charts and labs. Personally reviewed x-rays. EKG is pending. Patient will be transferred to [facility]. Patient's family is agreeable. Dr. Blaine Hamper will be the accepting physician.   DVT Prophylaxis SCDs for now until chest x-ray does not show any effusion.  Code Status: DO NOT RESUSCITATE.  Family Communication: Patient's nephew.  Disposition Plan: Admit to inpatient.    Gabrielly Mccrystal N. [facility] Pager [phone].  If 7PM-7AM, please contact night-coverage [email or web address] Password [email or web address] 04/03/2015, 12:27 AM

## 2015-04-03 NOTE — Clinical Social Work Note (Signed)
CSW received referral for possible SNF placement. Per chart review and RN/MD report, patient admitted from [facility]. CSW contacted [facility] ([place] who stated patient not a resident at their facility. CSW has contacted [facility], awaiting confirmation that patient is a resident at the facility.  CSW to continue to follow and assist with discharge planning needs.  Lubertha Sayres, [place] Clinical Social Work Department Orthopedics [phone]) and Surgical [phone])

## 2015-04-03 NOTE — Progress Notes (Signed)
[facility] PROGRESS NOTE  Jasmin Rodriguez TIW:[id number] DOB: 1914-06-09 DOA: 04/02/2015 PCP: Estill Dooms, MD  HPI Jasmin Rodriguez is an 81 yo female who was brought the ED after having a fall at her assisted living facility. Patient is uncertain as to how she fell, but per report she was walking and suddenly fell. In ED, she was found to have R-sided chest pain and x-rays revealed minimally displaced right-sided rib fracture. Trauma surgery was consulted and patient was admitted for further observation. PMH includes: dementia, HTN, CKD, and hypothyroidism.  Subjective: Today the patient reports that she is doing "fine". She is still unable to recall the events surrounding her fall. Denies pain at rest, but is very tender to palpation of abdomen. Denies fever, chills, N/V/D, or abdominal pain. Denies CP or SOB.   Assessment/Plan: Principal Problem:  Right rib fracture: S/P fall. Initial imaging showed fracture of right ribs 1-3, no pneumothorax. Trauma team following, recommended PT/OT evaluation and incentive spirometry. Repeat imaging today showed stable fractures with no pneumothorax. Denies pain at rest, but PT evaluation revealed severe pain with mobility. Trauma team initiated low dose oxycodone (2.5 mg q8 PRN for pain). PT recommending SNF on discharge. Continue to incentive spirometry and monitor.   Active Problems:    Hypertension: 174/135 on admission, likely secondary to pain from fall. Controlled at home with Norvasc. Continues to be elevated today (189/72), still likely secondary to persistent pain. Continue at home medications with PRN Hydralazine.   Hypokalemia: 2.7 on admission, etiology unknown. Increased to 3.4 with repletion. Mg WNL at 1.8. Continue to replete and recheck in AM.  Leukocytosis: 19.8 on admission, likely secondary to UTI (see below). Improved today (14.9) with antibiotics. Afebrile. Continue to monitor  Urinary Tract Infection: Identified in ED.  Patient without urinary complaints, but is TTP along suprapubic region. Mild leukocytosis, afebrile.  Initiated Ceftriaxone, continue to monitor.   Chronic Kidney Disease, stage 3: GFR 31, BUN/Cr 18/1.37 currently stable and at baseline. Continue to monitor.   Hypothyroidism: Controlled at home on synthroid, continue.  Dementia: Chronic, managed at home with Donepezil, continue.   Code Status: DNR Family Communication: None at bedside Disposition Plan: SNF when stable  DVT Prophylaxis: SCDs  Consultants:  Trauma surgery   Procedures:  None   Antibiotics: Anti-infectives    Start     Dose/Rate Route Frequency Ordered Stop   04/03/15 0100  cefTRIAXone (ROCEPHIN) 1 g in dextrose 5 % 50 mL IVPB     1 g 100 mL/hr over 30 Minutes Intravenous Every 24 hours 04/03/15 0026           Objective: Filed Vitals:   04/03/15 1001  BP: 166/80  Pulse:   Temp:   Resp:    No intake or output data in the 24 hours ending 04/03/15 1139 Filed Weights   04/03/15 0012  Weight: 55.6 kg (122 lb 9.2 oz)    Exam:   General:  Frail-appearing woman, laying in bed with no acute distress  Cardiovascular: RRR, no m/r/g. No peripheral edema   Respiratory: CTA b/l, no wheeze or crackles   Abdomen: Soft, non-distended, TTP on R side and suprapubic region. + BS   Neuro: A&Ox3, no focal neurological deficits, appropriate movement of upper and lower extremities   Data Reviewed: Basic Metabolic Panel:  Recent Labs Lab 04/02/15 2135 04/03/15 0444  NA 138 137  K 2.7* 3.4*  CL 97* 98*  CO2  --  28  GLUCOSE 151* 147*  BUN  21* 18  CREATININE 1.40* 1.37*  CALCIUM  --  9.3  MG  --  1.8   Liver Function Tests:  Recent Labs Lab 04/03/15 0444  AST 36  ALT 18  ALKPHOS 64  BILITOT 0.8  PROT 6.7  ALBUMIN 3.0*   No results for input(s): LIPASE, AMYLASE in the last 168 hours. No results for input(s): AMMONIA in the last 168 hours. CBC:  Recent Labs Lab 04/02/15 2128  04/02/15 2135 04/03/15 0444  WBC 19.8*  --  14.9*  NEUTROABS 16.9*  --  12.7*  HGB 11.9* 13.3 12.2  HCT 35.3* 39.0 36.0  MCV 88.9  --  89.8  PLT 203  --  198   Cardiac Enzymes: No results for input(s): CKTOTAL, CKMB, CKMBINDEX, TROPONINI in the last 168 hours. BNP (last 3 results)  Recent Labs  11/11/14 2034  BNP 190.9*    ProBNP (last 3 results) No results for input(s): PROBNP in the last 8760 hours.  CBG: No results for input(s): GLUCAP in the last 168 hours.  No results found for this or any previous visit (from the past 240 hour(s)).   Studies: Dg Ribs Unilateral W/chest Right  04/02/2015   CLINICAL DATA:  Fall.  Right chest pain.  EXAM: RIGHT RIBS AND CHEST - 3+ VIEW  COMPARISON:  11/11/2014  FINDINGS: The heart is moderately enlarged. Linear scar versus atelectasis in the inferior right upper lobe. Low lung volumes. No pneumothorax. Normal vascularity.  Acute minimally displaced fractures of the right first second and third ribs are noted. The right fifth rib fracture is chronic and shows evidence of healing. Chronic left rib deformities. Osteopenia.  IMPRESSION: Acute right first second and third rib fractures.  Cardiomegaly without decompensation.   Electronically Signed   By: Marybelle Killings M.D.   On: 04/02/2015 21:58   Dg Shoulder Right  04/02/2015   CLINICAL DATA:  Fall.  EXAM: RIGHT SHOULDER - 2+ VIEW  COMPARISON:  None.  FINDINGS: Proximal right humerus and glenohumeral joint appear normal. Clavicle appears normal. Several mildly displaced right upper rib fractures are noted.  IMPRESSION: Several mildly displaced right upper rib fractures are noted. Glenohumeral joint and proximal right humerus appear normal.   Electronically Signed   By: Marijo Conception, M.D.   On: 04/02/2015 20:45   Dg Elbow 2 Views Left  04/02/2015   CLINICAL DATA:  Possible fall  EXAM: LEFT ELBOW - 2 VIEW  COMPARISON:  None.  FINDINGS: Osteopenia.  No acute fracture.  No dislocation.  IMPRESSION: No  acute bony pathology   Electronically Signed   By: Marybelle Killings M.D.   On: 04/02/2015 20:42   Dg Chest Port 1 View  04/03/2015   CLINICAL DATA:  Right rib fractures.  EXAM: PORTABLE CHEST 1 VIEW  COMPARISON:  04/02/2015.  11/11/2014 .  12/02/2011 .  FINDINGS: Mediastinum hilar structures are normal. Stable cardiomegaly with normal pulmonary vascularity. Lungs are clear of acute infiltrates. Mild bibasilar subsegmental atelectasis. Stable calcified pulmonary nodule right lung base consistent granuloma. Right upper rib fractures are again noted. No pneumothorax.  IMPRESSION: 1. Acute right upper rib fractures are again noted. No pneumothorax. 2. Calcified pulmonary nodule right lung base consistent with granuloma. No interim change from multiple prior exams. Mild bibasilar subsegmental atelectasis.   Electronically Signed   By: Marcello Moores  Register   On: 04/03/2015 07:27   Dg Hip Unilat  With Pelvis 2-3 Views Right  04/02/2015   CLINICAL DATA:  Possible fall  EXAM:  DG HIP (WITH OR WITHOUT PELVIS) 2-3V RIGHT  COMPARISON:  12/02/2011  FINDINGS: Stabilization hardware is present in both proximal femurs. There is no breakage or loosening of the hardware. Osteopenia. No definite acute fracture or dislocation. Vascular calcifications are noted.  IMPRESSION: No acute bony pathology.   Electronically Signed   By: Marybelle Killings M.D.   On: 04/02/2015 20:41    Scheduled Meds: . amLODipine  10 mg Oral Daily  . cefTRIAXone (ROCEPHIN)  IV  1 g Intravenous Q24H  . citalopram  20 mg Oral Daily  . donepezil  10 mg Oral QHS  . feeding supplement  1 Container Oral BID BM  . levothyroxine  100 mcg Oral Q0600  . multivitamin with minerals  1 tablet Oral Daily  . polyethylene glycol  17 g Oral Daily   Continuous Infusions: . sodium chloride 10 mL/hr at 04/03/15 1122    Principal Problem:   Right rib fracture Active Problems:   Hypothyroidism   Dementia   Hypertension, uncontrolled   Hypokalemia   CKD (chronic  kidney disease) stage 3, GFR 30-59 ml/min   Urinary tract infectious disease   Fracture, rib       Zakery Normington, Student-PA  [facility] If 7PM-7AM, please contact night-coverage at [email or web address], password [facility] 04/03/2015, 11:39 AM  LOS: 1 day

## 2015-04-03 NOTE — Evaluation (Signed)
Physical Therapy Evaluation Patient Details Name: Jasmin Rodriguez MRN: [id number] DOB: [birth year] Today's Date: 04/03/2015   History of Present Illness  Jasmin Rodriguez is a 81 y.o. female with history of dementia, hypertension, chronic kidney disease, hypothyroidism was brought to the ER after patient had a fall at her living facility. Patient is unable to exactly explain how she fell but as per the report patient was walking when she suddenly fell. In the ER patient was found to have right-sided chest pain and x-rays revealed minimally displaced right-sided rib fracture.   Clinical Impression  Pt admitted with above diagnosis. Pt currently with functional limitations due to the deficits listed below (see PT Problem List). Pt will benefit from skilled PT to increase their independence and safety with mobility to allow discharge to the venue listed below.  Pt denies pain at rest, but with mobility screams, swats and grabs staff.  Will need to coordinate PT session with pain meds even when pt states she has no pain.  Pt required +2 for rolling even after pain meds (plus a 3rd to assist with changing of sheets).  Recommend SNF.    Follow Up Recommendations SNF    Equipment Recommendations  None recommended by PT    Recommendations for Other Services       Precautions / Restrictions Precautions Precautions: Fall Precaution Comments: R rib fracures      Mobility  Bed Mobility Overal bed mobility: Needs Assistance;+2 for physical assistance Bed Mobility: Rolling;Supine to Sit;Sit to Supine Rolling: +2 for physical assistance;+2 for safety/equipment;Mod assist   Supine to sit: Max assist Sit to supine: Max assist   General bed mobility comments: In supine pt states she had no pain, so attempted supine <> sit. Got pt almost to EOB, but pt screaming and resisting PT with leaning back and starting to swat at PT.  Nursing informed and gave pain meds and PT assisted nurse and Nurse tech  with cleaning pt and required MOD of 2 due to pt grabbing staff's arms and digging nails in while yelling.    Transfers                 General transfer comment: not attempted  Ambulation/Gait                Stairs            Wheelchair Mobility    Modified Rankin (Stroke Patients Only)       Balance Overall balance assessment: History of Falls                                           Pertinent Vitals/Pain Pain Assessment: Faces Faces Pain Scale: Hurts worst Pain Location: R upper chest Pain Descriptors / Indicators: Moaning;Guarding Pain Intervention(s): Repositioned;Monitored during session;Limited activity within patient's tolerance    Home Living Family/patient expects to be discharged to:: Skilled nursing facility                 Additional Comments: [facility]- unable to clarify if in ALF or SNF    Prior Function Level of Independence: Independent with assistive device(s)         Comments: Amb with RW     Hand Dominance        Extremity/Trunk Assessment   Upper Extremity Assessment: Generalized weakness;Difficult to assess due to impaired cognition  Lower Extremity Assessment: Generalized weakness;Difficult to assess due to impaired cognition         Communication      Cognition Arousal/Alertness: Awake/alert   Overall Cognitive Status: History of cognitive impairments - at baseline Area of Impairment: Problem solving;Safety/judgement;Following commands;Memory;Orientation Orientation Level: Person   Memory: Decreased short-term memory Following Commands: Follows one step commands inconsistently Safety/Judgement: Decreased awareness of safety;Decreased awareness of deficits   Problem Solving: Decreased initiation;Difficulty sequencing;Slow processing      General Comments      Exercises        Assessment/Plan    PT Assessment Patient needs continued PT services  PT  Diagnosis Acute pain   PT Problem List Decreased activity tolerance;Decreased mobility;Pain  PT Treatment Interventions Functional mobility training;Therapeutic activities;Balance training;Gait training   PT Goals (Current goals can be found in the Care Plan section) Acute Rehab PT Goals PT Goal Formulation: Patient unable to participate in goal setting Time For Goal Achievement: 04/10/15 Potential to Achieve Goals: Fair    Frequency Min 2X/week   Barriers to discharge        Co-evaluation               End of Session Equipment Utilized During Treatment: Oxygen Activity Tolerance: Patient limited by pain Patient left: in bed;with bed alarm set;with nursing/sitter in room Nurse Communication: Mobility status         Time: 0921 (with 20 min break for nurse to give pain meds.)-1026 PT Time Calculation (min) (ACUTE ONLY): 65 min   Charges:   PT Evaluation $Initial PT Evaluation Tier I: 1 Procedure PT Treatments $Therapeutic Activity: 23-37 mins   PT G Codes:        Gianfranco Araki LUBECK 04/03/2015, 10:30 AM

## 2015-04-03 NOTE — Progress Notes (Signed)
Utilization review completed.  

## 2015-04-04 DIAGNOSIS — E039 Hypothyroidism, unspecified: Secondary | ICD-10-CM

## 2015-04-04 DIAGNOSIS — E876 Hypokalemia: Secondary | ICD-10-CM

## 2015-04-04 DIAGNOSIS — I10 Essential (primary) hypertension: Secondary | ICD-10-CM

## 2015-04-04 LAB — BASIC METABOLIC PANEL
Anion gap: 13 (ref 5–15)
BUN: 20 mg/dL (ref 6–20)
CO2: 25 mmol/L (ref 22–32)
CREATININE: 1.06 mg/dL — AB (ref 0.44–1.00)
Calcium: 9 mg/dL (ref 8.9–10.3)
Chloride: 97 mmol/L — ABNORMAL LOW (ref 101–111)
GFR, EST AFRICAN AMERICAN: 49 mL/min — AB (ref >60)
GFR, EST NON AFRICAN AMERICAN: 42 mL/min — AB (ref >60)
Glucose, Bld: 135 mg/dL — ABNORMAL HIGH (ref 65–99)
Potassium: 4.1 mmol/L (ref 3.5–5.1)
SODIUM: 135 mmol/L (ref 135–145)

## 2015-04-04 LAB — CBC
HCT: 35 % — ABNORMAL LOW (ref 36.0–46.0)
HEMOGLOBIN: 11.7 g/dL — AB (ref 12.0–15.0)
MCH: 29.8 pg (ref 26.0–34.0)
MCHC: 33.4 g/dL (ref 30.0–36.0)
MCV: 89.3 fL (ref 78.0–100.0)
PLATELETS: 150 10*3/uL (ref 150–400)
RBC: 3.92 MIL/uL (ref 3.87–5.11)
RDW: 13.8 % (ref 11.5–15.5)
WBC: 14.1 10*3/uL — ABNORMAL HIGH (ref 4.0–10.5)

## 2015-04-04 MED ORDER — LABETALOL HCL 100 MG PO TABS
100.0000 mg | ORAL_TABLET | Freq: Two times a day (BID) | ORAL | Status: DC
  Administered 2015-04-04 – 2015-04-05 (×3): 100 mg via ORAL
  Filled 2015-04-04 (×6): qty 1

## 2015-04-04 MED ORDER — HYDRALAZINE HCL 20 MG/ML IJ SOLN
5.0000 mg | Freq: Once | INTRAMUSCULAR | Status: AC
  Administered 2015-04-04: 5 mg via INTRAVENOUS
  Filled 2015-04-04: qty 1

## 2015-04-04 NOTE — Progress Notes (Signed)
Patient ID: Jasmin Rodriguez, female   DOB: May 12, 1914, 81 y.o.   MRN: [id number]   LOS: 2 days   Subjective: No c/o   Objective: Vital signs in last 24 hours: Temp:  [97.9 F (36.6 C)-98.9 F (37.2 C)] 97.9 F (36.6 C) (10/06 0535) Pulse Rate:  [70-81] 71 (10/06 0535) Resp:  [16-19] 19 (10/06 0535) BP: (154-188)/(54-80) 186/72 mmHg (10/06 0535) SpO2:  [94 %-96 %] 96 % (10/06 0535) Weight:  [51.3 kg (113 lb 1.5 oz)] 51.3 kg (113 lb 1.5 oz) (10/06 0500) Last BM Date:  (prior to arrival)   IS: 750ml   Laboratory  CBC  Recent Labs  04/03/15 0444 04/04/15 0639  WBC 14.9* 14.1*  HGB 12.2 11.7*  HCT 36.0 35.0*  PLT 198 150   BMET  Recent Labs  04/03/15 0444 04/04/15 0639  NA 137 135  K 3.4* 4.1  CL 98* 97*  CO2 28 25  GLUCOSE 147* 135*  BUN 18 20  CREATININE 1.37* 1.06*  CALCIUM 9.3 9.0    Physical Exam General appearance: alert and no distress Resp: clear to auscultation bilaterally Cardio: regular rate and rhythm   Assessment/Plan: Fall Right rib fx -- Pulmonary toilet Multiple medical problems -- Per primary service    Lisette Abu, PA-C Pager: [phone] General Trauma PA Pager: [phone]  04/04/2015

## 2015-04-04 NOTE — Clinical Social Work Note (Signed)
CSW has confirmed patient is a resident with [facility]. [facility] admissions liaison provided CSW with patient's [facility] contact information. ([place], [phone]) Per admissions liaison, patient able to return to [facility] once medically stable.  CSW has left message with [facility]CP[employer] to confirm discharge plans. CSW awaits return call. Full assessment to follow.  Lubertha Sayres, [place] Clinical Social Work Department Orthopedics [phone]) and Surgical [phone])

## 2015-04-04 NOTE — Progress Notes (Signed)
[facility] PROGRESS NOTE  Jasmin M Wee OJJ:[id number] DOB: 1913/10/20 DOA: 04/02/2015 PCP: Estill Dooms, MD  HPI Jasmin Rodriguez is an 81 yo female who was brought the ED after having a fall at her assisted living facility. Patient is uncertain as to how she fell, but per report she was walking and suddenly fell. In ED, she was found to have R-sided chest pain and x-rays revealed minimally displaced right-sided rib fracture. Trauma surgery was consulted and patient was admitted for further observation. PMH includes: dementia, HTN, CKD, and hypothyroidism.  Subjective: Today the patient remains "fine". Continues to deny pain at rest, but is remains tender to palpation of right abdomen/ribs. Denies fever, chills, N/V/D, or abdominal pain. Denies CP or SOB.   Assessment/Plan: Principal Problem:  Right rib fracture: S/P fall. Initial imaging showed fracture of right ribs 1-3, no pneumothorax. Denies pain at rest, but tender to palpation. Trauma team following, initiated low dose oxycodone yesterday without complications. Continue incentive spirometry and monitor.   Active Problems:  Hypertension: 174/135 on admission, likely secondary to pain from fall. Controlled at home with Norvasc. Continues to be elevated today (186/72), but stable and still likely secondary to persistent pain. Continue at home medications with PRN Hydralazine.   Hypokalemia: 2.7 on admission, etiology unknown. Resolved at 4.1 today with repletion. Continue to monitor.  Leukocytosis: 19.8 on admission, likely secondary to UTI (see below). Improving (14.1 today) with antibiotics. Afebrile. Continue to monitor  Urinary Tract Infection: Identified in ED. Patient without urinary complaints, but is TTP along suprapubic region. Mild leukocytosis, afebrile. Continue Ceftriaxone and monitor.   Chronic Kidney Disease, stage 3: GFR 42, continues to improve (BUN/Cr 20/1.06), at baseline. Continue to monitor.    Hypothyroidism: Controlled at home on synthroid, continue.  Dementia: Chronic, managed at home with Donepezil, continue.   Code Status: DNR Family Communication: None at bedside Disposition Plan: SNF when stable  DVT Prophylaxis: SCDs   Consultants:  Trauma Surgery   Procedures:  None  Antibiotics: Anti-infectives    Start     Dose/Rate Route Frequency Ordered Stop   04/03/15 0100  cefTRIAXone (ROCEPHIN) 1 g in dextrose 5 % 50 mL IVPB     1 g 100 mL/hr over 30 Minutes Intravenous Every 24 hours 04/03/15 0026          Objective: Filed Vitals:   04/04/15 0535  BP: 186/72  Pulse: 71  Temp: 97.9 F (36.6 C)  Resp: 19    Intake/Output Summary (Last 24 hours) at 04/04/15 1010 Last data filed at 04/04/15 0900  Gross per 24 hour  Intake    480 ml  Output      0 ml  Net    480 ml   Filed Weights   04/03/15 0012 04/04/15 0500  Weight: 55.6 kg (122 lb 9.2 oz) 51.3 kg (113 lb 1.5 oz)    Exam:   General:  Elderly, frail woman, laying in bed, no acute distress  Cardiovascular: RRR, no m/r/g. No peripheral edema   Respiratory: CTA b/l, no wheeze or crackles  Abdomen: Soft, non-tender, non-distended. Remains TTP on R ribs  Neuro: A&Ox3, no focal neurological deficits. Appropriate movement of upper and lower extremitie  Data Reviewed: Basic Metabolic Panel:  Recent Labs Lab 04/02/15 2135 04/03/15 0444 04/04/15 0639  NA 138 137 135  K 2.7* 3.4* 4.1  CL 97* 98* 97*  CO2  --  28 25  GLUCOSE 151* 147* 135*  BUN 21* 18 20  CREATININE 1.40*  1.37* 1.06*  CALCIUM  --  9.3 9.0  MG  --  1.8  --    Liver Function Tests:  Recent Labs Lab 04/03/15 0444  AST 36  ALT 18  ALKPHOS 64  BILITOT 0.8  PROT 6.7  ALBUMIN 3.0*   No results for input(s): LIPASE, AMYLASE in the last 168 hours. No results for input(s): AMMONIA in the last 168 hours. CBC:  Recent Labs Lab 04/02/15 2128 04/02/15 2135 04/03/15 0444 04/04/15 0639  WBC 19.8*  --  14.9*  14.1*  NEUTROABS 16.9*  --  12.7*  --   HGB 11.9* 13.3 12.2 11.7*  HCT 35.3* 39.0 36.0 35.0*  MCV 88.9  --  89.8 89.3  PLT 203  --  198 150   Cardiac Enzymes: No results for input(s): CKTOTAL, CKMB, CKMBINDEX, TROPONINI in the last 168 hours. BNP (last 3 results)  Recent Labs  11/11/14 2034  BNP 190.9*    ProBNP (last 3 results) No results for input(s): PROBNP in the last 8760 hours.  CBG: No results for input(s): GLUCAP in the last 168 hours.  No results found for this or any previous visit (from the past 240 hour(s)).   Studies: Dg Ribs Unilateral W/chest Right  04/02/2015   CLINICAL DATA:  Fall.  Right chest pain.  EXAM: RIGHT RIBS AND CHEST - 3+ VIEW  COMPARISON:  11/11/2014  FINDINGS: The heart is moderately enlarged. Linear scar versus atelectasis in the inferior right upper lobe. Low lung volumes. No pneumothorax. Normal vascularity.  Acute minimally displaced fractures of the right first second and third ribs are noted. The right fifth rib fracture is chronic and shows evidence of healing. Chronic left rib deformities. Osteopenia.  IMPRESSION: Acute right first second and third rib fractures.  Cardiomegaly without decompensation.   Electronically Signed   By: Marybelle Killings M.D.   On: 04/02/2015 21:58   Dg Shoulder Right  04/02/2015   CLINICAL DATA:  Fall.  EXAM: RIGHT SHOULDER - 2+ VIEW  COMPARISON:  None.  FINDINGS: Proximal right humerus and glenohumeral joint appear normal. Clavicle appears normal. Several mildly displaced right upper rib fractures are noted.  IMPRESSION: Several mildly displaced right upper rib fractures are noted. Glenohumeral joint and proximal right humerus appear normal.   Electronically Signed   By: Marijo Conception, M.D.   On: 04/02/2015 20:45   Dg Elbow 2 Views Left  04/02/2015   CLINICAL DATA:  Possible fall  EXAM: LEFT ELBOW - 2 VIEW  COMPARISON:  None.  FINDINGS: Osteopenia.  No acute fracture.  No dislocation.  IMPRESSION: No acute bony pathology    Electronically Signed   By: Marybelle Killings M.D.   On: 04/02/2015 20:42   Dg Chest Port 1 View  04/03/2015   CLINICAL DATA:  Right rib fractures.  EXAM: PORTABLE CHEST 1 VIEW  COMPARISON:  04/02/2015.  11/11/2014 .  12/02/2011 .  FINDINGS: Mediastinum hilar structures are normal. Stable cardiomegaly with normal pulmonary vascularity. Lungs are clear of acute infiltrates. Mild bibasilar subsegmental atelectasis. Stable calcified pulmonary nodule right lung base consistent granuloma. Right upper rib fractures are again noted. No pneumothorax.  IMPRESSION: 1. Acute right upper rib fractures are again noted. No pneumothorax. 2. Calcified pulmonary nodule right lung base consistent with granuloma. No interim change from multiple prior exams. Mild bibasilar subsegmental atelectasis.   Electronically Signed   By: Marcello Moores  Register   On: 04/03/2015 07:27   Dg Hip Unilat  With Pelvis 2-3 Views Right  04/02/2015   CLINICAL DATA:  Possible fall  EXAM: DG HIP (WITH OR WITHOUT PELVIS) 2-3V RIGHT  COMPARISON:  12/02/2011  FINDINGS: Stabilization hardware is present in both proximal femurs. There is no breakage or loosening of the hardware. Osteopenia. No definite acute fracture or dislocation. Vascular calcifications are noted.  IMPRESSION: No acute bony pathology.   Electronically Signed   By: Marybelle Killings M.D.   On: 04/02/2015 20:41    Scheduled Meds: . amLODipine  10 mg Oral Daily  . cefTRIAXone (ROCEPHIN)  IV  1 g Intravenous Q24H  . citalopram  20 mg Oral Daily  . donepezil  10 mg Oral QHS  . feeding supplement  1 Container Oral BID BM  . labetalol  100 mg Oral BID  . levothyroxine  100 mcg Oral Q0600  . multivitamin with minerals  1 tablet Oral Daily  . polyethylene glycol  17 g Oral Daily    Principal Problem:   Right rib fracture Active Problems:   Hypothyroidism   Dementia   Hypertension, uncontrolled   Hypokalemia   CKD (chronic kidney disease) stage 3, GFR 30-59 ml/min   Urinary tract  infectious disease   Fracture, rib      Janiesha Diehl, Student-PA  [facility] If 7PM-7AM, please contact night-coverage at [email or web address], password [facility] 04/04/2015, 10:10 AM  LOS: 2 days

## 2015-04-05 ENCOUNTER — Inpatient Hospital Stay (HOSPITAL_COMMUNITY): Payer: Medicare Other

## 2015-04-05 DIAGNOSIS — F0391 Unspecified dementia with behavioral disturbance: Secondary | ICD-10-CM

## 2015-04-05 DIAGNOSIS — N3000 Acute cystitis without hematuria: Secondary | ICD-10-CM

## 2015-04-05 DIAGNOSIS — N183 Chronic kidney disease, stage 3 (moderate): Secondary | ICD-10-CM

## 2015-04-05 MED ORDER — LABETALOL HCL 100 MG PO TABS: 100.0000 mg | ORAL_TABLET | Freq: Two times a day (BID) | ORAL | Status: AC

## 2015-04-05 MED ORDER — BOOST / RESOURCE BREEZE PO LIQD: 1.0000 | Freq: Two times a day (BID) | ORAL | Status: AC

## 2015-04-05 MED ORDER — OXYCODONE HCL 5 MG PO TABS: 2.5000 mg | ORAL_TABLET | Freq: Three times a day (TID) | ORAL | Status: AC | PRN

## 2015-04-05 MED ORDER — SENNOSIDES-DOCUSATE SODIUM 8.6-50 MG PO TABS: 2.0000 | ORAL_TABLET | Freq: Every day | ORAL | Status: AC

## 2015-04-05 MED ORDER — ACETAMINOPHEN 500 MG PO TABS: 1000.0000 mg | ORAL_TABLET | Freq: Three times a day (TID) | ORAL | Status: AC

## 2015-04-05 MED ORDER — FLEET ENEMA 7-19 GM/118ML RE ENEM
1.0000 | ENEMA | Freq: Once | RECTAL | Status: AC
  Administered 2015-04-05: 1 via RECTAL
  Filled 2015-04-05: qty 1

## 2015-04-05 NOTE — Care Management Important Message (Signed)
Important Message  Patient Details  Name: Jasmin Rodriguez MRN: [id number] Date of Birth: 1914/04/05   Medicare Important Message Given:  Yes-second notification given    Delorse Lek 04/05/2015, 11:24 AM

## 2015-04-05 NOTE — Progress Notes (Signed)
Physical Therapy Treatment Patient Details Name: Jasmin Rodriguez MRN: [id number] DOB: 1913-09-02 Today's Date: 04/05/2015    History of Present Illness Jasmin M Gaxiola is a 81 y.o. female with history of dementia, hypertension, chronic kidney disease, hypothyroidism was brought to the ER after patient had a fall at her living facility. Patient is unable to exactly explain how she fell but as per the report patient was walking when she suddenly fell. In the ER patient was found to have right-sided chest pain and x-rays revealed minimally displaced right-sided rib fracture.     PT Comments    Ms. Sarabia was very agitated w/ any mobility and required total +2 assist for bed mobility and transfer to chair.  RN w/ pt at end of session sitting up in recliner chair.  Ms. Harm is planning to return to [facility] for SNF at d/c.   Follow Up Recommendations  SNF     Equipment Recommendations  None recommended by PT    Recommendations for Other Services       Precautions / Restrictions Precautions Precautions: Fall Precaution Comments: R rib fracures Restrictions Weight Bearing Restrictions: No    Mobility  Bed Mobility Overal bed mobility: Needs Assistance;+2 for physical assistance Bed Mobility: Supine to Sit     Supine to sit: Total assist;+2 for physical assistance;HOB elevated     General bed mobility comments: Pt reports no pain in supine but becomes very agitated and begins thowing her arms and pinching when assisted to sitting using bed pad, supporting trunk posteriorly, and managing Bil LEs.  Transfers Overall transfer level: Needs assistance Equipment used: 2 person hand held assist Transfers: Stand Pivot Transfers;Sit to/from Stand Sit to Stand: Total assist;+2 physical assistance Stand pivot transfers: Total assist;+2 physical assistance       General transfer comment: Total +2 assist for sit<>stand and stand pivot.  Pt holding onto therapist but  otherwise not assisting.   Ambulation/Gait                 Stairs            Wheelchair Mobility    Modified Rankin (Stroke Patients Only)       Balance Overall balance assessment: History of Falls                                  Cognition Arousal/Alertness: Awake/alert Behavior During Therapy: Agitated;Anxious Overall Cognitive Status: History of cognitive impairments - at baseline Area of Impairment: Problem solving;Safety/judgement;Following commands;Memory;Orientation Orientation Level: Person   Memory: Decreased short-term memory;Decreased recall of precautions Following Commands: Follows one step commands inconsistently Safety/Judgement: Decreased awareness of safety;Decreased awareness of deficits   Problem Solving: Decreased initiation;Difficulty sequencing;Slow processing;Requires verbal cues;Requires tactile cues General Comments: Pt very agitated w/ any movement other than hand holding    Exercises      General Comments        Pertinent Vitals/Pain Pain Assessment: Faces Faces Pain Scale: Hurts worst Pain Location: pt not specified, likely upper chest Pain Descriptors / Indicators: Grimacing;Moaning;Guarding Pain Intervention(s): Limited activity within patient's tolerance;Monitored during session;Repositioned;Relaxation    Home Living                      Prior Function            PT Goals (current goals can now be found in the care plan section) Acute Rehab PT Goals Patient Stated Goal:  none stated PT Goal Formulation: Patient unable to participate in goal setting Time For Goal Achievement: 04/10/15 Potential to Achieve Goals: Fair Progress towards PT goals: Progressing toward goals (very modestly)    Frequency  Min 2X/week    PT Plan Current plan remains appropriate    Co-evaluation             End of Session Equipment Utilized During Treatment: Gait belt;Oxygen Activity Tolerance: Patient  limited by pain;Treatment limited secondary to agitation Patient left: in chair;with call bell/phone within reach;with chair alarm set;with nursing/sitter in room     Time: 1040-1100 PT Time Calculation (min) (ACUTE ONLY): 20 min  Charges:  $Therapeutic Activity: 8-22 mins                    G Codes:      Joslyn Hy PT, [place] [phone] Pager: [phone] 04/05/2015, 4:16 PM

## 2015-04-05 NOTE — Progress Notes (Signed)
Patient ID: Jasmin Rodriguez, female   DOB: 1914/06/14, 81 y.o.   MRN: [id number]   LOS: 3 days   Subjective: Doing ok this morning but less agreeable.   Objective: Vital signs in last 24 hours: Temp:  [98.7 F (37.1 C)-99.6 F (37.6 C)] 98.7 F (37.1 C) (10/07 0600) Pulse Rate:  [64-93] 71 (10/07 0600) Resp:  [16-18] 16 (10/07 0600) BP: (126-192)/(39-78) 142/47 mmHg (10/07 0600) SpO2:  [95 %-97 %] 95 % (10/07 0600) Weight:  [53 kg (116 lb 13.5 oz)] 53 kg (116 lb 13.5 oz) (10/07 0500) Last BM Date:  (PTA)   IS: Would not perform   Physical Exam General appearance: alert and no distress Resp: clear to auscultation bilaterally Cardio: regular rate and rhythm GI: normal findings: bowel sounds normal and soft, non-tender   Assessment/Plan: Fall Right rib fx -- Pulmonary toilet Multiple medical problems -- Per primary service    Lisette Abu, PA-C Pager: [phone] General Trauma PA Pager: [phone]  04/05/2015

## 2015-04-05 NOTE — Clinical Social Work Note (Signed)
Patient to be discharged back to [facility]. Patient's HCPOA/niece updated via phone.  Facility: [facility] RN report number: [phone] Transportation: EMS  Lubertha Sayres, [employer] [phone]) and Surgical [phone])

## 2015-04-05 NOTE — Clinical Social Work Note (Signed)
Clinical Social Work Assessment  Patient Details  Name: Jasmin Rodriguez MRN: [id number] Date of Birth: 04/10/1914  Date of referral:  04/02/15 (CSW has not been able to reach family/HCPOA.)               Reason for consult:  Facility Placement, Discharge Planning (Admitted from facility: [place])                Permission sought to share information with:  Facility Sport and exercise psychologist, Family Supports Permission granted to share information::  Yes, Verbal Permission Granted  Name::     Jasmin Rodriguez  Agency::  [facility]  Relationship::  Niece  Contact Information:  [phone]  Housing/Transportation Living arrangements for the past 2 months:  [place] ([place]) Source of Information:  Facility, Other (Comment Required) (HCPOA: Jasmin Rodriguez) Patient Interpreter Needed:  None Criminal Activity/Legal Involvement Pertinent to Current Situation/Hospitalization:  No - Comment as needed Significant Relationships:  Adult Children, Other Family Members Lives with:  Facility Resident Do you feel safe going back to the place where you live?  Yes (Patient's family anticipates patient to return once discharged from [facility].) Need for family participation in patient care:  Yes (Comment) (Patient's HCPOA/niece involved in patient's care.)  Care giving concerns:  Patient's family expressed no concerns regarding discharge.   Social Worker assessment / plan:  CSW received referral regarding patient admitted from [facility]. CSW confirmed with patient's HCPOA/niece and facility admissions liaison that patient is able to return once medically stable for discharge. CSW to continue to follow and assist with discharge planning needs.  Employment status:  Retired Forensic scientist:  Medicare PT Recommendations:  [place] / Referral to community resources:  [place]  Patient/Family's Response to care:  Patient's family understanding and agreeable to CSW plan of care.  Patient/Family's Understanding of and Emotional Response to Diagnosis, Current Treatment, and Prognosis:  Patient's family understanding and agreeable to CSW plan of care.  Emotional Assessment Appearance:  Appears stated age Attitude/Demeanor/Rapport:  Other (Patient oriented to self only, CSW spoke with patient's HCPOA/niece.) Affect (typically observed):  Other (Patient oriented to self only, CSW spoke with patient's HCPOA/niece.) Orientation:  Oriented to Self Alcohol / Substance use:  Not Applicable Psych involvement (Current and /or in the community):  No (Comment) (Not appropriate on this admission.)  Discharge Needs  Concerns to be addressed:  No discharge needs identified Readmission within the last 30 days:  No Current discharge risk:  None Barriers to Discharge:  No Barriers Identified   Caroline Sauger, LCSW 04/05/2015, 11:32 AM [phone]

## 2015-04-05 NOTE — Progress Notes (Signed)
Spoke with Shanti at [facility] at [place].  Faxed the final report from her CT earlier today per their request.

## 2015-04-05 NOTE — Progress Notes (Deleted)
[facility] PROGRESS NOTE  Jasmin M Prichard SNK:[id number] DOB: 05-Nov-1913 DOA: 04/02/2015 PCP: Estill Dooms, MD  HPI Jasmin Rodriguez is an 81 yo female who was brought the ED after having a fall at her assisted living facility. Patient is uncertain as to how she fell, but per report she was walking and suddenly fell. In ED, she was found to have R-sided chest pain and x-rays revealed minimally displaced right-sided rib fracture. Trauma surgery was consulted and patient was admitted for further observation. PMH includes: dementia, HTN, CKD, and hypothyroidism.  Subjective: Today the patient reports that she is "not doing too well". She is unable to express why she is not doing well, but she is significantly tender to palpation on R abdomen and suprapubic region.   Assessment/Plan: Principal Problem:  Right rib fracture: S/P fall. Initial imaging showed fracture of right ribs 1-3, no pneumothorax. Continues to be tender to palpation on ribs/suprapubic region (smacks hand away and screams in pain). Trauma team following and initiated low dose oxycodone for pain, but patient still significantly tender. No BM noted since admission, but patient is on prophylactic Miralax. CT pending to evaluate pain source. Continue incentive spirometry, pain control, and monitor.   Active Problems:  Hypertension: 174/135 on admission, likely secondary to pain from fall. Controlled at home with Norvasc. Continues to be elevated today, but much improved (142/47), likely secondary to persistent pain. Continue at home medications with PRN Hydralazine.   Hypokalemia: 2.7 on admission, etiology unknown. Resolved with repletion.  Leukocytosis: 19.8 on admission, likely secondary to UTI (see below). Improved to 4.1 with antibiotics. Remains afebrile. Continue to monitor.   Urinary Tract Infection: Identified in ED. Patient without urinary complaints. Mild leukocytosis, afebrile. Initiated Ceftriaxone x 3  days, discontinued today.   Chronic Kidney Disease, stage 3: GFR 42, continues to improve (BUN/Cr 20/1.06), at baseline. Continue to monitor.   Hypothyroidism: Controlled at home on synthroid, continue.  Dementia: Chronic, managed at home with Donepezil, continue.   Code Status: DNR Family Communication: None at bedside Disposition Plan: SNF when stable  DVT Prophylaxis: SCDs   Consultants:  Trauma Surgery   Procedures:  None  Antibiotics: Anti-infectives    Start     Dose/Rate Route Frequency Ordered Stop   04/03/15 0100  cefTRIAXone (ROCEPHIN) 1 g in dextrose 5 % 50 mL IVPB     1 g 100 mL/hr over 30 Minutes Intravenous Every 24 hours 04/03/15 0026        Objective: Filed Vitals:   04/05/15 0600  BP: 142/47  Pulse: 71  Temp: 98.7 F (37.1 C)  Resp: 16    Intake/Output Summary (Last 24 hours) at 04/05/15 1108 Last data filed at 04/04/15 1300  Gross per 24 hour  Intake    120 ml  Output      0 ml  Net    120 ml   Filed Weights   04/03/15 0012 04/04/15 0500 04/05/15 0500  Weight: 55.6 kg (122 lb 9.2 oz) 51.3 kg (113 lb 1.5 oz) 53 kg (116 lb 13.5 oz)    Exam:   General: Elderly, frail woman, laying in bed, no acute distress  Cardiovascular: RRR, no m/r/g. No peripheral edema   Respiratory: CTA b/l, no wheeze or crackles  Abdomen: Soft, non-tender, non-distended. Increased TTP on R ribs and suprapubic region  Neuro: A&O, no focal neurological deficits. Appropriate movement of upper and lower extremities  Data Reviewed: Basic Metabolic Panel:  Recent Labs Lab 04/02/15 2135 04/03/15 0444 04/04/15  0639  NA 138 137 135  K 2.7* 3.4* 4.1  CL 97* 98* 97*  CO2  --  28 25  GLUCOSE 151* 147* 135*  BUN 21* 18 20  CREATININE 1.40* 1.37* 1.06*  CALCIUM  --  9.3 9.0  MG  --  1.8  --    Liver Function Tests:  Recent Labs Lab 04/03/15 0444  AST 36  ALT 18  ALKPHOS 64  BILITOT 0.8  PROT 6.7  ALBUMIN 3.0*   No results for input(s): LIPASE,  AMYLASE in the last 168 hours. No results for input(s): AMMONIA in the last 168 hours. CBC:  Recent Labs Lab 04/02/15 2128 04/02/15 2135 04/03/15 0444 04/04/15 0639  WBC 19.8*  --  14.9* 14.1*  NEUTROABS 16.9*  --  12.7*  --   HGB 11.9* 13.3 12.2 11.7*  HCT 35.3* 39.0 36.0 35.0*  MCV 88.9  --  89.8 89.3  PLT 203  --  198 150   Cardiac Enzymes: No results for input(s): CKTOTAL, CKMB, CKMBINDEX, TROPONINI in the last 168 hours. BNP (last 3 results)  Recent Labs  11/11/14 2034  BNP 190.9*    ProBNP (last 3 results) No results for input(s): PROBNP in the last 8760 hours.  CBG: No results for input(s): GLUCAP in the last 168 hours.  No results found for this or any previous visit (from the past 240 hour(s)).   Studies: No results found.  Scheduled Meds: . amLODipine  10 mg Oral Daily  . cefTRIAXone (ROCEPHIN)  IV  1 g Intravenous Q24H  . citalopram  20 mg Oral Daily  . donepezil  10 mg Oral QHS  . feeding supplement  1 Container Oral BID BM  . labetalol  100 mg Oral BID  . levothyroxine  100 mcg Oral Q0600  . multivitamin with minerals  1 tablet Oral Daily  . polyethylene glycol  17 g Oral Daily     Principal Problem:   Right rib fracture Active Problems:   Hypothyroidism   Dementia   Hypertension, uncontrolled   Hypokalemia   CKD (chronic kidney disease) stage 3, GFR 30-59 ml/min   Urinary tract infectious disease   Fracture, rib    [facility] If 7PM-7AM, please contact night-coverage at [email or web address], password [facility] 04/05/2015, 11:08 AM  LOS: 3 days

## 2015-04-05 NOTE — Discharge Summary (Signed)
PATIENT DETAILS Name: Jasmin M Parham Age: 81 y.o. Sex: female Date of Birth: [birth year] MRN: [id number]. Admitting Physician: Rise Patience, MD TTS:[id number], Viviann Spare, MD  Admit Date: 04/02/2015 Discharge date: 04/05/2015  Recommendations for Outpatient Follow-up:  1. Consider Palliative care evaluation at SNF 2. Please repeat CBC/BMET in 1 week  PRIMARY DISCHARGE DIAGNOSIS:  Principal Problem:   Right rib fracture Active Problems:   Hypothyroidism   Dementia   Hypertension, uncontrolled   Hypokalemia   CKD (chronic kidney disease) stage 3, GFR 30-59 ml/min   Urinary tract infectious disease   Fracture, rib      PAST MEDICAL HISTORY: Past Medical History  Diagnosis Date  . Hypertension   . Anemia   . Hypothyroidism   . Hyperlipidemia   . Osteopenia   . Radial fracture   . Femur fracture ([place])   . Dementia 10/12/2012  . Unstable gait 04/16/2014  . Edema 11/10/2012  . Unspecified constipation 01/02/2013  . Skin cancer 05/01/14    left calf  . Mitral valve prolapse   . Vitamin D deficiency   . SCC (squamous cell carcinoma), leg 04/12/2014    04/24/14 general surgeon: likely SCC to the left lower leg: non operative treatment. Refer to dermatology to see there is any topical options available. May consider regional block and excision if there are no topical options.  05/01/14 Dermatology L calf lesion-biopsy. 1120/15: well differentiated invasive Squamous Cell Carcinoma. Mohs vs RT-Dr. Harvel Quale 05/21/14 06/11/14 MOHS 07/02/13     DISCHARGE MEDICATIONS: Current Discharge Medication List    START taking these medications   Details  acetaminophen (TYLENOL) 500 MG tablet Take 2 tablets (1,000 mg total) by mouth 3 (three) times daily. For 3 more days and then stop    labetalol (NORMODYNE) 100 MG tablet Take 1 tablet (100 mg total) by mouth 2 (two) times daily.    oxyCODONE (OXY IR/ROXICODONE) 5 MG immediate release tablet Take 0.5 tablets (2.5 mg total) by mouth every 8  (eight) hours as needed for moderate pain or severe pain. Qty: 15 tablet, Refills: 0    senna-docusate (SENOKOT-S) 8.6-50 MG tablet Take 2 tablets by mouth at bedtime.      CONTINUE these medications which have CHANGED   Details  feeding supplement (BOOST / RESOURCE BREEZE) LIQD Take 1 Container by mouth 2 (two) times daily between meals. Refills: 0      CONTINUE these medications which have NOT CHANGED   Details  amLODipine (NORVASC) 10 MG tablet Take 10 mg by mouth daily.    donepezil (ARICEPT) 10 MG tablet Take 10 mg by mouth at bedtime.    levothyroxine (SYNTHROID, LEVOTHROID) 100 MCG tablet Take 100 mcg by mouth daily at 6 (six) AM. **Check pulse weekly on Monday**    Multiple Vitamin (THERA/BETA-CAROTENE) TABS Take 1 tablet by mouth daily at 12 noon.     polyethylene glycol powder (GLYCOLAX/MIRALAX) powder Take 17 g by mouth daily. Mix in 4-8 oz of fluid and drink    citalopram (CELEXA) 40 MG tablet Take 0.5 tablets (20 mg total) by mouth every morning. Qty: 15 tablet, Refills: five    nitroGLYCERIN (NITROSTAT) 0.4 MG SL tablet Place 0.4 mg under the tongue every 5 (five) minutes as needed for chest pain.        ALLERGIES:   Allergies  Allergen Reactions  . Tramadol Other (See Comments)    Listed on MAR  . Zoloft [Sertraline Hcl] Other (See Comments)    Listed on [facility]  BRIEF HPI:  See H&P, Labs, Consult and Test reports for all details in brief, patient is an 81 yo female who was brought the ED after having a fall at her assisted living facility. Patient is uncertain as to how she fell, but per report she was walking and suddenly fell. In ED, she was found to have R-sided chest pain and x-rays revealed minimally displaced right-sided rib fracture. Trauma surgery was consulted and patient was admitted for further observation.   CONSULTATIONS:   Trauma  PERTINENT RADIOLOGIC STUDIES: Ct Abdomen Pelvis Wo Contrast  04/05/2015   CLINICAL DATA:  Abdominal pain.  Patient unable to further define new symptoms.  EXAM: CT ABDOMEN AND PELVIS WITHOUT CONTRAST  TECHNIQUE: Multidetector CT imaging of the abdomen and pelvis was performed following the standard protocol without IV contrast.  COMPARISON:  01/05/2007  FINDINGS: Lung bases: Small right minimal left pleural effusions. Healed calcified granuloma noted in the posterior right lower lobe, stable. There is mild dependent atelectasis but no convincing pneumonia or pulmonary edema. Heart is mildly enlarged with coronary artery calcifications.  Liver:  Central volume loss, stable, but is otherwise unremarkable.  Spleen: Small calcifications consistent with healed granuloma. Otherwise unremarkable.  Gallbladder and biliary tree: Gallbladder is distended. There are no convincing stones or wall thickening or inflammatory change. No bile duct dilation.  There pancreas: Unremarkable.  Adrenal glands: Mild prominence of the left adrenal gland likely hyperplasia. No discrete masses. Normal right adrenal gland.  Kidneys, ureters, bladder: Left kidney is severely atrophic. No mass or hydronephrosis. Right kidney is displaced inferiorly as it was on the prior exam. 15 mm low-density lesion arises from the posterior lower aspect of the kidney, likely a cyst, but new or increased in size from the prior study. No other renal masses, no stones and no hydronephrosis. Bladder is unremarkable.  Uterus and adnexa: Status post hysterectomy. No adnexal/ pelvic masses.  Lymph nodes:  No adenopathy.  Ascites:  None.  Gastrointestinal: No bowel wall thickening or inflammatory changes. No evidence of obstruction or generalized adynamic ileus. Stomach is unremarkable. Appendix not visualized, it may have been surgically removed.  Vascular: There are dense atherosclerotic calcifications throughout the aorta and its branch vessels. No aneurysm.  Musculoskeletal: Bones diffusely demineralized. Prior hip fractures been reduced with compression screws an  intra measure outside each side. There are advanced degenerative changes of the lumbar spine with a dextroscoliosis. No osteoblastic or osteolytic lesions.  IMPRESSION: 1. No acute findings within the abdomen or pelvis. 2. Small right and minimal left pleural effusions with associated dependent lung base atelectasis but no evidence of pneumonia or pulmonary edema. 3. Multiple chronic findings in the abdomen and pelvis as detailed above.   Electronically Signed   By: Lajean Manes M.D.   On: 04/05/2015 11:59   Dg Ribs Unilateral W/chest Right  04/02/2015   CLINICAL DATA:  Fall.  Right chest pain.  EXAM: RIGHT RIBS AND CHEST - 3+ VIEW  COMPARISON:  11/11/2014  FINDINGS: The heart is moderately enlarged. Linear scar versus atelectasis in the inferior right upper lobe. Low lung volumes. No pneumothorax. Normal vascularity.  Acute minimally displaced fractures of the right first second and third ribs are noted. The right fifth rib fracture is chronic and shows evidence of healing. Chronic left rib deformities. Osteopenia.  IMPRESSION: Acute right first second and third rib fractures.  Cardiomegaly without decompensation.   Electronically Signed   By: Marybelle Killings M.D.   On: 04/02/2015 21:58   Dg  Shoulder Right  04/02/2015   CLINICAL DATA:  Fall.  EXAM: RIGHT SHOULDER - 2+ VIEW  COMPARISON:  None.  FINDINGS: Proximal right humerus and glenohumeral joint appear normal. Clavicle appears normal. Several mildly displaced right upper rib fractures are noted.  IMPRESSION: Several mildly displaced right upper rib fractures are noted. Glenohumeral joint and proximal right humerus appear normal.   Electronically Signed   By: Marijo Conception, M.D.   On: 04/02/2015 20:45   Dg Elbow 2 Views Left  04/02/2015   CLINICAL DATA:  Possible fall  EXAM: LEFT ELBOW - 2 VIEW  COMPARISON:  None.  FINDINGS: Osteopenia.  No acute fracture.  No dislocation.  IMPRESSION: No acute bony pathology   Electronically Signed   By: Marybelle Killings  M.D.   On: 04/02/2015 20:42   Dg Chest Port 1 View  04/03/2015   CLINICAL DATA:  Right rib fractures.  EXAM: PORTABLE CHEST 1 VIEW  COMPARISON:  04/02/2015.  11/11/2014 .  12/02/2011 .  FINDINGS: Mediastinum hilar structures are normal. Stable cardiomegaly with normal pulmonary vascularity. Lungs are clear of acute infiltrates. Mild bibasilar subsegmental atelectasis. Stable calcified pulmonary nodule right lung base consistent granuloma. Right upper rib fractures are again noted. No pneumothorax.  IMPRESSION: 1. Acute right upper rib fractures are again noted. No pneumothorax. 2. Calcified pulmonary nodule right lung base consistent with granuloma. No interim change from multiple prior exams. Mild bibasilar subsegmental atelectasis.   Electronically Signed   By: Marcello Moores  Register   On: 04/03/2015 07:27   Dg Hip Unilat  With Pelvis 2-3 Views Right  04/02/2015   CLINICAL DATA:  Possible fall  EXAM: DG HIP (WITH OR WITHOUT PELVIS) 2-3V RIGHT  COMPARISON:  12/02/2011  FINDINGS: Stabilization hardware is present in both proximal femurs. There is no breakage or loosening of the hardware. Osteopenia. No definite acute fracture or dislocation. Vascular calcifications are noted.  IMPRESSION: No acute bony pathology.   Electronically Signed   By: Marybelle Killings M.D.   On: 04/02/2015 20:41     PERTINENT LAB RESULTS: CBC:  Recent Labs  04/03/15 0444 04/04/15 0639  WBC 14.9* 14.1*  HGB 12.2 11.7*  HCT 36.0 35.0*  PLT 198 150   CMET CMP     Component Value Date/Time   NA 135 04/04/2015 0639   NA 139 09/04/2014   K 4.1 04/04/2015 0639   CL 97* 04/04/2015 0639   CO2 25 04/04/2015 0639   GLUCOSE 135* 04/04/2015 0639   BUN 20 04/04/2015 0639   BUN 17 09/04/2014   CREATININE 1.06* 04/04/2015 0639   CREATININE 1.1 09/04/2014   CALCIUM 9.0 04/04/2015 0639   PROT 6.7 04/03/2015 0444   ALBUMIN 3.0* 04/03/2015 0444   AST 36 04/03/2015 0444   ALT 18 04/03/2015 0444   ALKPHOS 64 04/03/2015 0444    BILITOT 0.8 04/03/2015 0444   GFRNONAA 42* 04/04/2015 0639   GFRAA 49* 04/04/2015 0639    GFR Estimated Creatinine Clearance: 21.4 mL/min (by C-G formula based on Cr of 1.06). No results for input(s): LIPASE, AMYLASE in the last 72 hours. No results for input(s): CKTOTAL, CKMB, CKMBINDEX, TROPONINI in the last 72 hours. Invalid input(s): POCBNP No results for input(s): DDIMER in the last 72 hours. No results for input(s): HGBA1C in the last 72 hours. No results for input(s): CHOL, HDL, LDLCALC, TRIG, CHOLHDL, LDLDIRECT in the last 72 hours. No results for input(s): TSH, T4TOTAL, T3FREE, THYROIDAB in the last 72 hours.  Invalid input(s): FREET3 No  results for input(s): VITAMINB12, FOLATE, FERRITIN, TIBC, IRON, RETICCTPCT in the last 72 hours. Coags: No results for input(s): INR in the last 72 hours.  Invalid input(s): PT Microbiology: No results found for this or any previous visit (from the past 240 hour(s)).   BRIEF HOSPITAL COURSE:  Right rib fracture: S/P fall. Initial imaging showed fracture of right ribs 1-3, no pneumothorax. Trauma team following and initiated low dose oxycodone for pain,Continue incentive spirometry, pain control, and monitor while at SNF  Abd pain:noted to have abd pain on palpation-CT Abd/Pelvis without acute abnormalities. ?Secondary to constipation-no BM since admit and on narcotics.History limited by dementia. One dose of Fleet enema prior to discharge, continue Miralax, add Senna on discharge.   FTD:DUKGURK with 3 days of Rocephin. UA also showed significant squamous cells along with WBC. Leukocytosis decreased to 14 from a peak of 19. Afebrile throughout this hospitalization. Stable for discharge without any further Abx. Suggest repeat CBC in 1 week  Leukocytosis:not sure this is reactive to rib fracture or to UTI. Downtrending WBC -follow-repeat as outpatient.  Hypokalemia: 2.7 on admission, etiology unknown. Resolved with repletion.  Chronic Kidney  Disease, stage 3: GFR 42, continues to improve (BUN/Cr 20/1.06), at baseline. Continue to monitor.   Hypothyroidism: Controlled at home on synthroid, continue.  Dementia: Chronic, managed at home with Donepezil, continue.  Essential YHC:WCBJSEGB Labetalol and Amlodipine.   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 Given frailty/advance age-if patient becomes acutely ill suspect best benefit from initiation of comfort measures. DNR in place.   Spoke with Linda-over the phone-family agreeable with discharge today to SNF.   TODAY-DAY OF DISCHARGE:  Subjective:   Jasmin Rodriguez today has no headache,no chest pain,no new weakness tingling or numbness. Appears to have some mid abd discomfort on palpation.  Objective:   Blood pressure 142/47, pulse 71, temperature 98.7 F (37.1 C), temperature source Oral, resp. rate 16, weight 53 kg (116 lb 13.5 oz), SpO2 95 %.  Intake/Output Summary (Last 24 hours) at 04/05/15 1249 Last data filed at 04/04/15 1300  Gross per 24 hour  Intake    120 ml  Output      0 ml  Net    120 ml   Filed Weights   04/03/15 0012 04/04/15 0500 04/05/15 0500  Weight: 55.6 kg (122 lb 9.2 oz) 51.3 kg (113 lb 1.5 oz) 53 kg (116 lb 13.5 oz)    Exam Awake Alert-but pleasantly confused, No new F.N deficits [place].AT,PERRAL Supple Neck,No JVD, No cervical lymphadenopathy appriciated.  Symmetrical Chest wall movement, Good air movement bilaterally, CTAB RRR,No Gallops,Rubs or new Murmurs, No Parasternal Heave +ve B.Sounds, Abd Soft, Tender in the mid abd area, No organomegaly appriciated, No rebound -guarding or rigidity. No Cyanosis, Clubbing or edema, No new Rash or bruise  DISCHARGE CONDITION: Stable  DISPOSITION: SNF  CODE STATUS: DNR  DISCHARGE INSTRUCTIONS:    Activity:  As tolerated with  Full fall precautions use walker/cane & assistance as needed  Get Medicines reviewed and adjusted: Please take all your medications with you for your next visit with your Primary MD  Please request your Primary MD to go over all hospital tests and procedure/radiological results at the follow up, please ask your Primary MD to get all Hospital records sent to his/her office.  If you experience worsening of your admission symptoms, develop shortness of breath, life threatening emergency, suicidal  or homicidal thoughts you must seek medical attention immediately by calling 911 or calling your MD immediately  if symptoms less severe.  You must read complete instructions/literature along with all the possible adverse reactions/side effects for all the Medicines you take and that have been prescribed to you. Take any new Medicines after you have completely understood and accpet all the possible adverse reactions/side effects.   Do not drive when taking Pain medications.   Do not take more than prescribed Pain, Sleep and Anxiety Medications  Special Instructions: If you have smoked or chewed Tobacco  in the last 2 yrs please stop smoking, stop any regular Alcohol  and or any Recreational drug use.  Wear Seat belts while driving.  Please note  You were cared for by a hospitalist during your hospital stay. Once you are discharged, your primary care physician will handle any further medical issues. Please note that NO REFILLS for any discharge medications will be authorized once you are discharged, as it is imperative that you return to your primary care physician (or establish a relationship with a primary care physician if you do not have one) for your aftercare needs so that they can reassess your need for medications and monitor your lab values.   Diet recommendation: Heart Healthy diet Aspiration precautions:yes  Discharge Instructions    Call MD for:  difficulty breathing, headache or visual  disturbances    Complete by:  As directed      Call MD for:  redness, tenderness, or signs of infection (pain, swelling, redness, odor or green/yellow discharge around incision site)    Complete by:  As directed      Diet - low sodium heart healthy    Complete by:  As directed      Increase activity slowly    Complete by:  As directed            Follow-up Information    Follow up with GREEN, Viviann Spare, MD. Schedule an appointment as soon as possible for a visit in 1 week.   Specialty:  Internal Medicine   Contact information:   [place] [phone] [phone]      Total Time spent on discharge equals  45 minutes.  SignedOren Binet 04/05/2015 12:49 PM

## 2015-04-08 ENCOUNTER — Encounter: Payer: Self-pay | Admitting: Internal Medicine

## 2015-04-08 ENCOUNTER — Non-Acute Institutional Stay (SKILLED_NURSING_FACILITY): Payer: Medicare Other | Admitting: Internal Medicine

## 2015-04-08 DIAGNOSIS — F329 Major depressive disorder, single episode, unspecified: Secondary | ICD-10-CM

## 2015-04-08 DIAGNOSIS — N183 Chronic kidney disease, stage 3 unspecified: Secondary | ICD-10-CM

## 2015-04-08 DIAGNOSIS — F0391 Unspecified dementia with behavioral disturbance: Secondary | ICD-10-CM

## 2015-04-08 DIAGNOSIS — I10 Essential (primary) hypertension: Secondary | ICD-10-CM | POA: Diagnosis not present

## 2015-04-08 DIAGNOSIS — E039 Hypothyroidism, unspecified: Secondary | ICD-10-CM

## 2015-04-08 DIAGNOSIS — S2231XA Fracture of one rib, right side, initial encounter for closed fracture: Secondary | ICD-10-CM | POA: Diagnosis not present

## 2015-04-08 DIAGNOSIS — C44729 Squamous cell carcinoma of skin of left lower limb, including hip: Secondary | ICD-10-CM

## 2015-04-08 DIAGNOSIS — N3000 Acute cystitis without hematuria: Secondary | ICD-10-CM | POA: Diagnosis not present

## 2015-04-08 DIAGNOSIS — F32A Depression, unspecified: Secondary | ICD-10-CM

## 2015-04-08 NOTE — Progress Notes (Signed)
Patient ID: Jasmin Rodriguez, female   DOB: 1913-07-02, 81 y.o.   MRN: [id number]    HISTORY AND PHYSICAL  Location:  [facility] Room Number: 2 B Place of Service: SNF (31)   Extended Emergency Contact Information Primary Emergency Contact: [place] Phone: [phone] Relation: None Secondary Emergency Contact: Cappellari,James          [place] [address] [place] Phone: [phone] Relation: None  Advanced Directive information Does patient have an advance directive?: Yes, Type of Advance Directive: Healthcare Power of [place];Living will;Out of facility DNR (pink MOST or yellow form), Pre-existing out of facility DNR order (yellow form or pink MOST form): Pink MOST form placed in chart (order not valid for inpatient use);Yellow form placed in chart (order not valid for inpatient use), Does patient want to make changes to advanced directive?: No - Patient declined  Chief Complaint  Patient presents with  . Readmit To SNF    following hospitalization    HPI:  Hospitalized 04/02/15 to 04/05/15 following a fall. Sustained fractures of the right ribs 1, 2, 3. Has had terrible pain and has been more dependent on others as a result of inability to move her right side without severe pain.   SCC (squamous cell carcinoma), leg, left - no residual cancer  Dementia, with behavioral disturbance - calm unless touched close to the rib fractures  Essential hypertension - controlled  Acute cystitis without hematuria - treated for 3 days with Rocephin in hospital. No culture done, but the urine was nitrite positive.  CKD (chronic kidney disease) stage 3, GFR 30-59 ml/min - will need continued monitoring  Hypothyroidism, unspecified hypothyroidism type - compensated  Depression - remains on Citalopram    Past Medical History  Diagnosis Date  . Hypertension   . Anemia   . Hypothyroidism   .  Hyperlipidemia   . Osteopenia   . Radial fracture   . Femur fracture ([place])   . Dementia 10/12/2012  . Unstable gait 04/16/2014  . Edema 11/10/2012  . Unspecified constipation 01/02/2013  . Skin cancer 05/01/14    left calf  . Mitral valve prolapse   . Vitamin D deficiency   . SCC (squamous cell carcinoma), leg 04/12/2014    04/24/14 general surgeon: likely SCC to the left lower leg: non operative treatment. Refer to dermatology to see there is any topical options available. May consider regional block and excision if there are no topical options.  05/01/14 Dermatology L calf lesion-biopsy. 1120/15: well differentiated invasive Squamous Cell Carcinoma. Mohs vs RT-Dr. Harvel Quale 05/21/14 06/11/14 MOHS 07/02/13     Past Surgical History  Procedure Laterality Date  . Total hip arthroplasty  2013  . Abdominal hysterectomy    . Skin biopsy Left 05/01/14    Left calf    Patient Care Team: Estill Dooms, MD as PCP - General (Internal Medicine) Gaynelle Arabian, MD as Consulting Physician (Orthopedic Surgery) Calvert Cantor, MD as Consulting Physician (Ophthalmology) Vania Rea, MD as Consulting Physician (Obstetrics and Gynecology)  Social History   Social History  . Marital Status: Single    Spouse Name: N/A  . Number of Children: N/A  . Years of Education: N/A   Occupational History  . Not on file.   Social History Main Topics  . Smoking status: Never Smoker   . Smokeless tobacco: Never Used  . Alcohol Use: No  . Drug Use: No  . Sexual Activity: No  Other Topics Concern  . Not on file   Social History Narrative    reports that she has never smoked. She has never used smokeless tobacco. She reports that she does not drink alcohol or use illicit drugs.  Family History  Problem Relation Age of Onset  . Dementia Neg Hx    Family Status  Relation Status Death Age  . Mother Deceased   . Father Deceased     Immunization History  Administered Date(s) Administered  .  Influenza-Unspecified 05/04/2013, 05/02/2014  . Pneumococcal Polysaccharide-23 06/30/1995  . Td 06/29/2001    Allergies  Allergen Reactions  . Tramadol Other (See Comments)    Listed on MAR  . Zoloft [Sertraline Hcl] Other (See Comments)    Listed on MAR    Medications: Patient's Medications  New Prescriptions   No medications on file  Previous Medications   ACETAMINOPHEN (TYLENOL) 500 MG TABLET    Take 2 tablets (1,000 mg total) by mouth 3 (three) times daily. For 3 more days and then stop   AMLODIPINE (NORVASC) 10 MG TABLET    Take 10 mg by mouth daily.   CITALOPRAM (CELEXA) 40 MG TABLET    Take 0.5 tablets (20 mg total) by mouth every morning.   DONEPEZIL (ARICEPT) 10 MG TABLET    Take 10 mg by mouth at bedtime.   FEEDING SUPPLEMENT (BOOST / RESOURCE BREEZE) LIQD    Take 1 Container by mouth 2 (two) times daily between meals.   LABETALOL (NORMODYNE) 100 MG TABLET    Take 1 tablet (100 mg total) by mouth 2 (two) times daily.   LEVOTHYROXINE (SYNTHROID, LEVOTHROID) 100 MCG TABLET    Take 100 mcg by mouth daily at 6 (six) AM. **Check pulse weekly on Monday**   MULTIPLE VITAMIN (THERA/BETA-CAROTENE) TABS    Take 1 tablet by mouth daily at 12 noon.    NITROGLYCERIN (NITROSTAT) 0.4 MG SL TABLET    Place 0.4 mg under the tongue every 5 (five) minutes as needed for chest pain.   OXYCODONE (OXY IR/ROXICODONE) 5 MG IMMEDIATE RELEASE TABLET    Take 0.5 tablets (2.5 mg total) by mouth every 8 (eight) hours as needed for moderate pain or severe pain.   POLYETHYLENE GLYCOL POWDER (GLYCOLAX/MIRALAX) POWDER    Take 17 g by mouth daily. Mix in 4-8 oz of fluid and drink   SENNA-DOCUSATE (SENOKOT-S) 8.6-50 MG TABLET    Take 2 tablets by mouth at bedtime.  Modified Medications   No medications on file  Discontinued Medications   No medications on file    Review of Systems  Constitutional: Negative for fever, chills and diaphoresis.       About #1Ib a month in the past 3 months.   HENT: Positive  for hearing loss. Negative for congestion, ear discharge, ear pain and nosebleeds.   Eyes: Negative for pain, discharge and redness.  Respiratory: Negative for cough and shortness of breath.   Cardiovascular: Positive for leg swelling. Negative for chest pain and palpitations.       Trace only in BLE  Gastrointestinal: Negative for nausea, vomiting and constipation.  Genitourinary: Positive for frequency. Negative for dysuria and urgency.  Musculoskeletal: Negative for myalgias, back pain and neck pain.  Skin: Negative for rash.  Neurological: Negative for dizziness, tremors, seizures, weakness and headaches.  Psychiatric/Behavioral: Negative for suicidal ideas and hallucinations. The patient is not nervous/anxious.     Filed Vitals:   04/08/15 1835  BP: 120/70  Pulse: 88  Temp:  98.6 F (37 C)  Resp: 22  Height: _0  (1.473 m)  Weight: 116 lb (52.617 kg)   Body mass index is 24.25 kg/(m^2).  Physical Exam  Constitutional: She is oriented to person, place, and time.  Frail. Elderly.  HENT:  Head: Normocephalic.  Right Ear: External ear normal.  Left Ear: External ear normal.  Mouth/Throat: No oropharyngeal exudate.  Torus mandibularis.   Eyes: Conjunctivae and EOM are normal. Pupils are equal, round, and reactive to light.  Neck: No JVD present. No tracheal deviation present. No thyromegaly present.  Cardiovascular: Normal rate, regular rhythm, normal heart sounds and intact distal pulses.  Exam reveals no gallop.   No murmur heard. Pulmonary/Chest: No respiratory distress. She has no wheezes. She has rales. She exhibits no tenderness.  Abdominal: She exhibits no distension and no mass. There is no tenderness.  Musculoskeletal: Normal range of motion. She exhibits tenderness.  Unstable gait. Using walker. Left shoulder pain with decreased ROM. Trace edema in ankles.  Terrible pain in the right upper chest with any movement.  Lymphadenopathy:    She has no cervical  adenopathy.  Neurological: She is alert and oriented to person, place, and time. No cranial nerve deficit.  Skin: No rash noted. No erythema. No pallor.  Status post excision of SCC of the left leg  Psychiatric: She has a normal mood and affect. Her speech is normal and behavior is normal. Judgment and thought content normal. Cognition and memory are impaired. She exhibits abnormal recent memory and abnormal remote memory.    Labs reviewed: Lab Summary Latest Ref Rng 04/04/2015 04/03/2015 04/02/2015 04/02/2015  Hemoglobin 12.0 - 15.0 g/dL 11.7(L) 12.2 13.3 11.9(L)  Hematocrit 36.0 - 46.0 % 35.0(L) 36.0 39.0 35.3(L)  White count 4.0 - 10.5 K/uL 14.1(H) 14.9(H) (None) 19.8(H)  Platelet count 150 - 400 K/uL 150 198 (None) 203  Sodium 135 - 145 mmol/L 135 137 138 (None)  Potassium 3.5 - 5.1 mmol/L 4.1 3.4(L) 2.7(LL) (None)  Calcium 8.9 - 10.3 mg/dL 9.0 9.3 (None) (None)  Phosphorus - (None) (None) (None) (None)  Creatinine 0.44 - 1.00 mg/dL 1.06(H) 1.37(H) 1.40(H) (None)  AST 15 - 41 U/L (None) 36 (None) (None)  Alk Phos 38 - 126 U/L (None) 64 (None) (None)  Bilirubin 0.3 - 1.2 mg/dL (None) 0.8 (None) (None)  Glucose 65 - 99 mg/dL 135(H) 147(H) 151(H) (None)  Cholesterol - (None) (None) (None) (None)  HDL cholesterol - (None) (None) (None) (None)  Triglycerides - (None) (None) (None) (None)  LDL Direct - (None) (None) (None) (None)  LDL Calc - (None) (None) (None) (None)  Total protein 6.5 - 8.1 g/dL (None) 6.7 (None) (None)  Albumin 3.5 - 5.0 g/dL (None) 3.0(L) (None) (None)   Lab Results  Component Value Date   BUN 20 04/04/2015   No results found for: HGBA1C Lab Results  Component Value Date   TSH 4.89 09/04/2014          Ct Abdomen Pelvis Wo Contrast  04/05/2015   CLINICAL DATA:  Abdominal pain. Patient unable to further define new symptoms.  EXAM: CT ABDOMEN AND PELVIS WITHOUT CONTRAST  TECHNIQUE: Multidetector CT imaging of the abdomen and pelvis was performed following  the standard protocol without IV contrast.  COMPARISON:  01/05/2007  FINDINGS: Lung bases: Small right minimal left pleural effusions. Healed calcified granuloma noted in the posterior right lower lobe, stable. There is mild dependent atelectasis but no convincing pneumonia or pulmonary edema. Heart is mildly enlarged with coronary artery  calcifications.  Liver:  Central volume loss, stable, but is otherwise unremarkable.  Spleen: Small calcifications consistent with healed granuloma. Otherwise unremarkable.  Gallbladder and biliary tree: Gallbladder is distended. There are no convincing stones or wall thickening or inflammatory change. No bile duct dilation.  There pancreas: Unremarkable.  Adrenal glands: Mild prominence of the left adrenal gland likely hyperplasia. No discrete masses. Normal right adrenal gland.  Kidneys, ureters, bladder: Left kidney is severely atrophic. No mass or hydronephrosis. Right kidney is displaced inferiorly as it was on the prior exam. 15 mm low-density lesion arises from the posterior lower aspect of the kidney, likely a cyst, but new or increased in size from the prior study. No other renal masses, no stones and no hydronephrosis. Bladder is unremarkable.  Uterus and adnexa: Status post hysterectomy. No adnexal/ pelvic masses.  Lymph nodes:  No adenopathy.  Ascites:  None.  Gastrointestinal: No bowel wall thickening or inflammatory changes. No evidence of obstruction or generalized adynamic ileus. Stomach is unremarkable. Appendix not visualized, it may have been surgically removed.  Vascular: There are dense atherosclerotic calcifications throughout the aorta and its branch vessels. No aneurysm.  Musculoskeletal: Bones diffusely demineralized. Prior hip fractures been reduced with compression screws an intra measure outside each side. There are advanced degenerative changes of the lumbar spine with a dextroscoliosis. No osteoblastic or osteolytic lesions.  IMPRESSION: 1. No acute  findings within the abdomen or pelvis. 2. Small right and minimal left pleural effusions with associated dependent lung base atelectasis but no evidence of pneumonia or pulmonary edema. 3. Multiple chronic findings in the abdomen and pelvis as detailed above.   Electronically Signed   By: Lajean Manes M.D.   On: 04/05/2015 11:59   Dg Ribs Unilateral W/chest Right  04/02/2015   CLINICAL DATA:  Fall.  Right chest pain.  EXAM: RIGHT RIBS AND CHEST - 3+ VIEW  COMPARISON:  11/11/2014  FINDINGS: The heart is moderately enlarged. Linear scar versus atelectasis in the inferior right upper lobe. Low lung volumes. No pneumothorax. Normal vascularity.  Acute minimally displaced fractures of the right first second and third ribs are noted. The right fifth rib fracture is chronic and shows evidence of healing. Chronic left rib deformities. Osteopenia.  IMPRESSION: Acute right first second and third rib fractures.  Cardiomegaly without decompensation.   Electronically Signed   By: Marybelle Killings M.D.   On: 04/02/2015 21:58   Dg Shoulder Right  04/02/2015   CLINICAL DATA:  Fall.  EXAM: RIGHT SHOULDER - 2+ VIEW  COMPARISON:  None.  FINDINGS: Proximal right humerus and glenohumeral joint appear normal. Clavicle appears normal. Several mildly displaced right upper rib fractures are noted.  IMPRESSION: Several mildly displaced right upper rib fractures are noted. Glenohumeral joint and proximal right humerus appear normal.   Electronically Signed   By: Marijo Conception, M.D.   On: 04/02/2015 20:45   Dg Elbow 2 Views Left  04/02/2015   CLINICAL DATA:  Possible fall  EXAM: LEFT ELBOW - 2 VIEW  COMPARISON:  None.  FINDINGS: Osteopenia.  No acute fracture.  No dislocation.  IMPRESSION: No acute bony pathology   Electronically Signed   By: Marybelle Killings M.D.   On: 04/02/2015 20:42   Dg Chest Port 1 View  04/03/2015   CLINICAL DATA:  Right rib fractures.  EXAM: PORTABLE CHEST 1 VIEW  COMPARISON:  04/02/2015.  11/11/2014 .   12/02/2011 .  FINDINGS: Mediastinum hilar structures are normal. Stable cardiomegaly with normal pulmonary  vascularity. Lungs are clear of acute infiltrates. Mild bibasilar subsegmental atelectasis. Stable calcified pulmonary nodule right lung base consistent granuloma. Right upper rib fractures are again noted. No pneumothorax.  IMPRESSION: 1. Acute right upper rib fractures are again noted. No pneumothorax. 2. Calcified pulmonary nodule right lung base consistent with granuloma. No interim change from multiple prior exams. Mild bibasilar subsegmental atelectasis.   Electronically Signed   By: Marcello Moores  Register   On: 04/03/2015 07:27   Dg Hip Unilat  With Pelvis 2-3 Views Right  04/02/2015   CLINICAL DATA:  Possible fall  EXAM: DG HIP (WITH OR WITHOUT PELVIS) 2-3V RIGHT  COMPARISON:  12/02/2011  FINDINGS: Stabilization hardware is present in both proximal femurs. There is no breakage or loosening of the hardware. Osteopenia. No definite acute fracture or dislocation. Vascular calcifications are noted.  IMPRESSION: No acute bony pathology.   Electronically Signed   By: Marybelle Killings M.D.   On: 04/02/2015 20:41     Assessment/Plan 1. Fracture, rib, right, closed, initial encounter -started fentanyl 25 mcg/ hour patch  2. SCC (squamous cell carcinoma), leg, left No residual cancer identified  3. Dementia, with behavioral disturbance uunchanged  4. Essential hypertension controlled  5. Acute cystitis without hematuria -treated during hospitalization  6. CKD (chronic kidney disease) stage 3, GFR 30-59 ml/min - continue to monitor lab  7. Hypothyroidism, unspecified hypothyroidism type compensated  8. Depression Controlled on Citalopram

## 2015-04-11 LAB — CBC AND DIFFERENTIAL
HEMATOCRIT: 29 % — AB (ref 36–46)
Hemoglobin: 9.5 g/dL — AB (ref 12.0–16.0)
PLATELETS: 200 10*3/uL (ref 150–399)
WBC: 8.8 10^3/mL

## 2015-04-11 LAB — BASIC METABOLIC PANEL
BUN: 47 mg/dL — AB (ref 4–21)
CREATININE: 1.5 mg/dL — AB (ref 0.5–1.1)
Glucose: 90 mg/dL
POTASSIUM: 3.8 mmol/L (ref 3.4–5.3)
Sodium: 143 mmol/L (ref 137–147)

## 2015-04-11 LAB — TSH: TSH: 1.59 u[IU]/mL (ref 0.41–5.90)

## 2015-04-15 ENCOUNTER — Non-Acute Institutional Stay (SKILLED_NURSING_FACILITY): Payer: Medicare Other | Admitting: Nurse Practitioner

## 2015-04-15 ENCOUNTER — Encounter: Payer: Self-pay | Admitting: Nurse Practitioner

## 2015-04-15 DIAGNOSIS — F32A Depression, unspecified: Secondary | ICD-10-CM

## 2015-04-15 DIAGNOSIS — E039 Hypothyroidism, unspecified: Secondary | ICD-10-CM

## 2015-04-15 DIAGNOSIS — F0390 Unspecified dementia without behavioral disturbance: Secondary | ICD-10-CM | POA: Diagnosis not present

## 2015-04-15 DIAGNOSIS — R609 Edema, unspecified: Secondary | ICD-10-CM

## 2015-04-15 DIAGNOSIS — F329 Major depressive disorder, single episode, unspecified: Secondary | ICD-10-CM | POA: Diagnosis not present

## 2015-04-15 DIAGNOSIS — K5900 Constipation, unspecified: Secondary | ICD-10-CM

## 2015-04-15 DIAGNOSIS — R627 Adult failure to thrive: Secondary | ICD-10-CM | POA: Diagnosis not present

## 2015-04-15 DIAGNOSIS — I10 Essential (primary) hypertension: Secondary | ICD-10-CM

## 2015-04-15 DIAGNOSIS — M25512 Pain in left shoulder: Secondary | ICD-10-CM

## 2015-04-15 DIAGNOSIS — E876 Hypokalemia: Secondary | ICD-10-CM

## 2015-04-15 NOTE — Assessment & Plan Note (Signed)
Rapid decline, will dc Aricept, no longer beneficial, Hospice referral.

## 2015-04-15 NOTE — Progress Notes (Signed)
Patient ID: Jasmin Rodriguez, female   DOB: [birth year], 81 y.o.   MRN: [id number]  Location:  SNF [facility] Provider:  Marlana Latus NP  Code Status:  DNR Goals of care: Advanced Directive information    Chief Complaint  Patient presents with  . Medical Management of Chronic Issues  . Acute Visit    FTT     HPI: Patient is a 81 y.o. female seen in the SNF at [facility] today for evaluation of FTT, the patient has rapid decline in general, noted more fatigue, decreased oral intake, less self sufficiency in ADLs, worsened memory recall, difficulty voice her own needs.   Review of Systems:  Review of Systems  Constitutional: Positive for weight loss. Negative for fever, chills and diaphoresis.       About #1Ib a month in the past 3 months.   HENT: Positive for hearing loss. Negative for congestion, ear discharge, ear pain and nosebleeds.   Eyes: Negative for pain, discharge and redness.  Respiratory: Negative for cough and shortness of breath.   Cardiovascular: Positive for leg swelling. Negative for chest pain and palpitations.       Trace only in BLE  Gastrointestinal: Negative for nausea, vomiting and constipation.  Genitourinary: Positive for frequency. Negative for dysuria and urgency.  Musculoskeletal: Negative for myalgias, back pain and neck pain.  Skin: Negative for rash.  Neurological: Negative for dizziness, tremors, seizures, weakness and headaches.  Psychiatric/Behavioral: Positive for memory loss. Negative for suicidal ideas and hallucinations. The patient is not nervous/anxious.     Past Medical History  Diagnosis Date  . Hypertension   . Anemia   . Hypothyroidism   . Hyperlipidemia   . Osteopenia   . Radial fracture   . Femur fracture ([place])   . Dementia 10/12/2012  . Unstable gait 04/16/2014  . Edema 11/10/2012  . Unspecified constipation 01/02/2013  . Skin cancer 05/01/14    left calf  . Mitral valve prolapse   . Vitamin D deficiency   . SCC (squamous  cell carcinoma), leg 04/12/2014    04/24/14 general surgeon: likely SCC to the left lower leg: non operative treatment. Refer to dermatology to see there is any topical options available. May consider regional block and excision if there are no topical options.  05/01/14 Dermatology L calf lesion-biopsy. 1120/15: well differentiated invasive Squamous Cell Carcinoma. Mohs vs RT-Dr. Harvel Quale 05/21/14 06/11/14 MOHS 07/02/13     Patient Active Problem List   Diagnosis Date Noted  . FTT (failure to thrive) in adult 04/15/2015  . Right rib fracture 04/03/2015  . Hypokalemia 04/03/2015  . CKD (chronic kidney disease) stage 3, GFR 30-59 ml/min 04/03/2015  . Urinary tract infectious disease 04/03/2015  . Fracture, rib 04/03/2015  . Left shoulder pain 07/09/2014  . Unstable gait 04/16/2014  . SCC (squamous cell carcinoma), leg 04/12/2014  . DNR (do not resuscitate) 01/02/2013  . Constipation 01/02/2013  . Edema 11/10/2012  . Dementia 10/12/2012  . Depression 10/12/2012  . Closed right hip fracture ([place]) 12/02/2011  . HTN (hypertension) 12/02/2011  . Hypothyroidism 12/02/2011    Allergies  Allergen Reactions  . Tramadol Other (See Comments)    Listed on MAR  . Zoloft [Sertraline Hcl] Other (See Comments)    Listed on MAR    Medications: Patient's Medications  New Prescriptions   No medications on file  Previous Medications   ACETAMINOPHEN (TYLENOL) 500 MG TABLET    Take 2 tablets (1,000 mg total) by mouth 3 (three)  times daily. For 3 more days and then stop   AMLODIPINE (NORVASC) 10 MG TABLET    Take 10 mg by mouth daily.   CITALOPRAM (CELEXA) 40 MG TABLET    Take 0.5 tablets (20 mg total) by mouth every morning.   DONEPEZIL (ARICEPT) 10 MG TABLET    Take 10 mg by mouth at bedtime.   FEEDING SUPPLEMENT (BOOST / RESOURCE BREEZE) LIQD    Take 1 Container by mouth 2 (two) times daily between meals.   LABETALOL (NORMODYNE) 100 MG TABLET    Take 1 tablet (100 mg total) by mouth 2 (two) times  daily.   LEVOTHYROXINE (SYNTHROID, LEVOTHROID) 100 MCG TABLET    Take 100 mcg by mouth daily at 6 (six) AM. **Check pulse weekly on Monday**   MULTIPLE VITAMIN (THERA/BETA-CAROTENE) TABS    Take 1 tablet by mouth daily at 12 noon.    NITROGLYCERIN (NITROSTAT) 0.4 MG SL TABLET    Place 0.4 mg under the tongue every 5 (five) minutes as needed for chest pain.   OXYCODONE (OXY IR/ROXICODONE) 5 MG IMMEDIATE RELEASE TABLET    Take 0.5 tablets (2.5 mg total) by mouth every 8 (eight) hours as needed for moderate pain or severe pain.   POLYETHYLENE GLYCOL POWDER (GLYCOLAX/MIRALAX) POWDER    Take 17 g by mouth daily. Mix in 4-8 oz of fluid and drink   SENNA-DOCUSATE (SENOKOT-S) 8.6-50 MG TABLET    Take 2 tablets by mouth at bedtime.  Modified Medications   No medications on file  Discontinued Medications   No medications on file    Physical Exam: Filed Vitals:   04/15/15 1219  BP: 132/70  Pulse: 70  Temp: 97.9 F (36.6 C)  TempSrc: Tympanic  Resp: 18   There is no weight on file to calculate BMI.  Physical Exam  Constitutional: She is oriented to person, place, and time.  Frail. Elderly.  HENT:  Head: Normocephalic.  Right Ear: External ear normal.  Left Ear: External ear normal.  Mouth/Throat: No oropharyngeal exudate.  Torus mandibularis.   Eyes: Conjunctivae and EOM are normal. Pupils are equal, round, and reactive to light.  Neck: No JVD present. No tracheal deviation present. No thyromegaly present.  Cardiovascular: Normal rate, regular rhythm, normal heart sounds and intact distal pulses.  Exam reveals no gallop.   No murmur heard. Pulmonary/Chest: No respiratory distress. She has no wheezes. She has rales. She exhibits no tenderness.  Abdominal: She exhibits no distension and no mass. There is no tenderness.  Musculoskeletal: Normal range of motion. She exhibits edema and tenderness.  Unstable gait. Using walker. Left shoulder pain with decreased ROM. Trace edema in ankles.     Lymphadenopathy:    She has no cervical adenopathy.  Neurological: She is alert and oriented to person, place, and time. No cranial nerve deficit.  Skin: No rash noted. No erythema. No pallor.  Psychiatric: She has a normal mood and affect. Her speech is normal and behavior is normal. Judgment and thought content normal. Cognition and memory are impaired. She exhibits abnormal recent memory and abnormal remote memory.    Labs reviewed: Basic Metabolic Panel:  Recent Labs  11/11/14 2037 04/02/15 2135 04/03/15 0444 04/04/15 0639 04/11/15  NA 138 138 137 135 143  K 3.6 2.7* 3.4* 4.1 3.8  CL 99* 97* 98* 97*  --   CO2 27  --  28 25  --   GLUCOSE 124* 151* 147* 135*  --   BUN 16 21* 18  20 47*  CREATININE 1.37* 1.40* 1.37* 1.06* 1.5*  CALCIUM 9.2  --  9.3 9.0  --   MG  --   --  1.8  --   --     Liver Function Tests:  Recent Labs  09/04/14 04/03/15 0444  AST 17 36  ALT 8 18  ALKPHOS 55 64  BILITOT  --  0.8  PROT  --  6.7  ALBUMIN  --  3.0*    CBC:  Recent Labs  04/02/15 2128  04/03/15 0444 04/04/15 0639 04/11/15  WBC 19.8*  --  14.9* 14.1* 8.8  NEUTROABS 16.9*  --  12.7*  --   --   HGB 11.9*  < > 12.2 11.7* 9.5*  HCT 35.3*  < > 36.0 35.0* 29*  MCV 88.9  --  89.8 89.3  --   PLT 203  --  198 150 200  < > = values in this interval not displayed.  Lab Results  Component Value Date   TSH 1.59 04/11/2015   No results found for: HGBA1C No results found for: CHOL, HDL, LDLCALC, LDLDIRECT, TRIG, CHOLHDL  Significant Diagnostic Results since last visit: none  Patient Care Team: Estill Dooms, MD as PCP - General (Internal Medicine) Gaynelle Arabian, MD as Consulting Physician (Orthopedic Surgery) Calvert Cantor, MD as Consulting Physician (Ophthalmology) Vania Rea, MD as Consulting Physician (Obstetrics and Gynecology)  Assessment/Plan Problem List Items Addressed This Visit    HTN (hypertension) (Chronic)    Allow permissive bp control with SBP in 150-160s, she  is asymptomatic, continue Amlodipine 10mg  daily and Labetalol 100mg  bid.        Hypothyroidism - Primary (Chronic)    04/11/15 TSH 1.587 Continue Levothyroxine 167mcg.       Dementia (Chronic)    Rapid decline, will dc Aricept, no longer beneficial, Hospice referral.       Edema (Chronic)    Not apparent, off diuretics.       Depression    Continue Celexa 20mg  for mood, continue to observe the patient.       Constipation    Stable on Senna S II daily and MiraLax daily.      Left shoulder pain    Multiple sites, will discontinue Fentanyl after current supplies exhausted, continue prn 5mg  q8 for now      Hypokalemia    Last serum K 3.8 04/11/15      FTT (failure to thrive) in adult    Less oral intake, Hgb 9.5, Bun 47, creatinine 1.50 04/11/15. 04/09/15 CXR CHF, pulmonary edema, w/o focal consolidation. Hospice referral, comfort measures          Family/ staff Communication: Hospice referral.   Labs/tests ordered:  CXR done 04/09/15, CBC, BMP, TSH done 04/11/15  [facility] Draiden Mirsky NP Geriatrics [facility] [address], [place] [address] On Call:  [phone] & follow prompts after 5pm & weekends Office Phone:  [phone] Office Fax:  [phone]

## 2015-04-15 NOTE — Assessment & Plan Note (Signed)
Not apparent, off diuretics.

## 2015-04-15 NOTE — Assessment & Plan Note (Signed)
Multiple sites, will discontinue Fentanyl after current supplies exhausted, continue prn 5mg  q8 for now

## 2015-04-15 NOTE — Assessment & Plan Note (Signed)
Stable on Senna S II daily and MiraLax daily.

## 2015-04-15 NOTE — Assessment & Plan Note (Signed)
Allow permissive bp control with SBP in 150-160s, she is asymptomatic, continue Amlodipine 10mg  daily and Labetalol 100mg  bid.

## 2015-04-15 NOTE — Assessment & Plan Note (Addendum)
Less oral intake, Hgb 9.5, Bun 47, creatinine 1.50 04/11/15. 04/09/15 CXR CHF, pulmonary edema, w/o focal consolidation. Hospice referral, comfort measures

## 2015-04-15 NOTE — Assessment & Plan Note (Signed)
04/11/15 TSH 1.587 Continue Levothyroxine 135mcg.

## 2015-04-15 NOTE — Assessment & Plan Note (Signed)
Continue Celexa 20mg  for mood, continue to observe the patient.

## 2015-04-15 NOTE — Assessment & Plan Note (Signed)
Last serum K 3.8 04/11/15

## 2015-05-08 ENCOUNTER — Encounter: Payer: Self-pay | Admitting: Nurse Practitioner

## 2015-05-08 ENCOUNTER — Non-Acute Institutional Stay (SKILLED_NURSING_FACILITY): Payer: Medicare Other | Admitting: Nurse Practitioner

## 2015-05-08 DIAGNOSIS — F32A Depression, unspecified: Secondary | ICD-10-CM

## 2015-05-08 DIAGNOSIS — I10 Essential (primary) hypertension: Secondary | ICD-10-CM | POA: Diagnosis not present

## 2015-05-08 DIAGNOSIS — F329 Major depressive disorder, single episode, unspecified: Secondary | ICD-10-CM

## 2015-05-08 DIAGNOSIS — R21 Rash and other nonspecific skin eruption: Secondary | ICD-10-CM | POA: Diagnosis not present

## 2015-05-08 DIAGNOSIS — M25512 Pain in left shoulder: Secondary | ICD-10-CM | POA: Diagnosis not present

## 2015-05-08 DIAGNOSIS — R609 Edema, unspecified: Secondary | ICD-10-CM

## 2015-05-08 DIAGNOSIS — E039 Hypothyroidism, unspecified: Secondary | ICD-10-CM | POA: Diagnosis not present

## 2015-05-08 DIAGNOSIS — K5900 Constipation, unspecified: Secondary | ICD-10-CM

## 2015-05-08 DIAGNOSIS — F0390 Unspecified dementia without behavioral disturbance: Secondary | ICD-10-CM

## 2015-05-08 DIAGNOSIS — E876 Hypokalemia: Secondary | ICD-10-CM | POA: Diagnosis not present

## 2015-05-08 DIAGNOSIS — R627 Adult failure to thrive: Secondary | ICD-10-CM | POA: Diagnosis not present

## 2015-05-08 NOTE — Assessment & Plan Note (Signed)
Not apparent, off diuretics.

## 2015-05-08 NOTE — Progress Notes (Signed)
Patient ID: Jasmin Rodriguez, female   DOB: September 12, 1913, 81 y.o.   MRN: [id number]  Location:  SNF [facility] Provider:  Marlana Latus NP  Code Status:  DNR Goals of care: Advanced Directive information    Chief Complaint  Patient presents with  . Medical Management of Chronic Issues  . Acute Visit    truncal rash     HPI: Patient is a 81 y.o. female seen in the SNF at [facility] today for evaluation of truncal rash, FTT, the patient has rapid decline in general, noted more fatigue, decreased oral intake, less self sufficiency in ADLs, worsened memory recall, difficulty voice her own needs.   Review of Systems:  Review of Systems  Constitutional: Positive for weight loss. Negative for fever, chills and diaphoresis.       About #1Ib a month in the past 3 months.   HENT: Positive for hearing loss. Negative for congestion, ear discharge, ear pain and nosebleeds.   Eyes: Negative for pain, discharge and redness.  Respiratory: Negative for cough and shortness of breath.   Cardiovascular: Positive for leg swelling. Negative for chest pain and palpitations.       Trace only in BLE  Gastrointestinal: Negative for nausea, vomiting and constipation.  Genitourinary: Positive for frequency. Negative for dysuria and urgency.  Musculoskeletal: Negative for myalgias, back pain and neck pain.  Skin: Positive for rash.       Lower chest, abd, mid back  Neurological: Negative for dizziness, tremors, seizures, weakness and headaches.  Psychiatric/Behavioral: Positive for memory loss. Negative for suicidal ideas and hallucinations. The patient is not nervous/anxious.     Past Medical History  Diagnosis Date  . Hypertension   . Anemia   . Hypothyroidism   . Hyperlipidemia   . Osteopenia   . Radial fracture   . Femur fracture ([place])   . Dementia 10/12/2012  . Unstable gait 04/16/2014  . Edema 11/10/2012  . Unspecified constipation 01/02/2013  . Skin cancer 05/01/14    left calf  . Mitral  valve prolapse   . Vitamin D deficiency   . SCC (squamous cell carcinoma), leg 04/12/2014    04/24/14 general surgeon: likely SCC to the left lower leg: non operative treatment. Refer to dermatology to see there is any topical options available. May consider regional block and excision if there are no topical options.  05/01/14 Dermatology L calf lesion-biopsy. 1120/15: well differentiated invasive Squamous Cell Carcinoma. Mohs vs RT-Dr. Harvel Quale 05/21/14 06/11/14 MOHS 07/02/13     Patient Active Problem List   Diagnosis Date Noted  . Rash and nonspecific skin eruption 05/08/2015  . FTT (failure to thrive) in adult 04/15/2015  . Right rib fracture 04/03/2015  . Hypokalemia 04/03/2015  . CKD (chronic kidney disease) stage 3, GFR 30-59 ml/min 04/03/2015  . Urinary tract infectious disease 04/03/2015  . Fracture, rib 04/03/2015  . Left shoulder pain 07/09/2014  . Unstable gait 04/16/2014  . SCC (squamous cell carcinoma), leg 04/12/2014  . DNR (do not resuscitate) 01/02/2013  . Constipation 01/02/2013  . Edema 11/10/2012  . Dementia 10/12/2012  . Depression 10/12/2012  . Closed right hip fracture ([place]) 12/02/2011  . HTN (hypertension) 12/02/2011  . Hypothyroidism 12/02/2011    Allergies  Allergen Reactions  . Tramadol Other (See Comments)    Listed on MAR  . Zoloft [Sertraline Hcl] Other (See Comments)    Listed on MAR    Medications: Patient's Medications  New Prescriptions   No medications on file  Previous Medications   ACETAMINOPHEN (TYLENOL) 500 MG TABLET    Take 2 tablets (1,000 mg total) by mouth 3 (three) times daily. For 3 more days and then stop   AMLODIPINE (NORVASC) 10 MG TABLET    Take 10 mg by mouth daily.   CITALOPRAM (CELEXA) 40 MG TABLET    Take 0.5 tablets (20 mg total) by mouth every morning.   DONEPEZIL (ARICEPT) 10 MG TABLET    Take 10 mg by mouth at bedtime.   FEEDING SUPPLEMENT (BOOST / RESOURCE BREEZE) LIQD    Take 1 Container by mouth 2 (two) times daily  between meals.   LABETALOL (NORMODYNE) 100 MG TABLET    Take 1 tablet (100 mg total) by mouth 2 (two) times daily.   LEVOTHYROXINE (SYNTHROID, LEVOTHROID) 100 MCG TABLET    Take 100 mcg by mouth daily at 6 (six) AM. **Check pulse weekly on Monday**   MULTIPLE VITAMIN (THERA/BETA-CAROTENE) TABS    Take 1 tablet by mouth daily at 12 noon.    NITROGLYCERIN (NITROSTAT) 0.4 MG SL TABLET    Place 0.4 mg under the tongue every 5 (five) minutes as needed for chest pain.   OXYCODONE (OXY IR/ROXICODONE) 5 MG IMMEDIATE RELEASE TABLET    Take 0.5 tablets (2.5 mg total) by mouth every 8 (eight) hours as needed for moderate pain or severe pain.   POLYETHYLENE GLYCOL POWDER (GLYCOLAX/MIRALAX) POWDER    Take 17 g by mouth daily. Mix in 4-8 oz of fluid and drink   SENNA-DOCUSATE (SENOKOT-S) 8.6-50 MG TABLET    Take 2 tablets by mouth at bedtime.  Modified Medications   No medications on file  Discontinued Medications   No medications on file    Physical Exam: Filed Vitals:   05/08/15 1315  BP: 104/64  Pulse: 76  Temp: 97.9 F (36.6 C)  TempSrc: Tympanic  Resp: 20   There is no weight on file to calculate BMI.  Physical Exam  Constitutional: She is oriented to person, place, and time.  Frail. Elderly.  HENT:  Head: Normocephalic.  Right Ear: External ear normal.  Left Ear: External ear normal.  Mouth/Throat: No oropharyngeal exudate.  Torus mandibularis.   Eyes: Conjunctivae and EOM are normal. Pupils are equal, round, and reactive to light.  Neck: No JVD present. No tracheal deviation present. No thyromegaly present.  Cardiovascular: Normal rate, regular rhythm, normal heart sounds and intact distal pulses.  Exam reveals no gallop.   No murmur heard. Pulmonary/Chest: No respiratory distress. She has no wheezes. She has rales. She exhibits no tenderness.  Abdominal: She exhibits no distension and no mass. There is no tenderness.  Musculoskeletal: Normal range of motion. She exhibits edema and  tenderness.  Unstable gait. Using walker. Left shoulder pain with decreased ROM. Trace edema in ankles.   Lymphadenopathy:    She has no cervical adenopathy.  Neurological: She is alert and oriented to person, place, and time. No cranial nerve deficit.  Small red macular dot, not itching  Skin: Rash noted. Rash is not macular. No erythema. No pallor.  Psychiatric: She has a normal mood and affect. Her speech is normal and behavior is normal. Judgment and thought content normal. Cognition and memory are impaired. She exhibits abnormal recent memory and abnormal remote memory.    Labs reviewed: Basic Metabolic Panel:  Recent Labs  11/11/14 2037 04/02/15 2135 04/03/15 0444 04/04/15 0639 04/11/15  NA 138 138 137 135 143  K 3.6 2.7* 3.4* 4.1 3.8  CL 99*  97* 98* 97*  --   CO2 27  --  28 25  --   GLUCOSE 124* 151* 147* 135*  --   BUN 16 21* 18 20 47*  CREATININE 1.37* 1.40* 1.37* 1.06* 1.5*  CALCIUM 9.2  --  9.3 9.0  --   MG  --   --  1.8  --   --     Liver Function Tests:  Recent Labs  09/04/14 04/03/15 0444  AST 17 36  ALT 8 18  ALKPHOS 55 64  BILITOT  --  0.8  PROT  --  6.7  ALBUMIN  --  3.0*    CBC:  Recent Labs  04/02/15 2128  04/03/15 0444 04/04/15 0639 04/11/15  WBC 19.8*  --  14.9* 14.1* 8.8  NEUTROABS 16.9*  --  12.7*  --   --   HGB 11.9*  < > 12.2 11.7* 9.5*  HCT 35.3*  < > 36.0 35.0* 29*  MCV 88.9  --  89.8 89.3  --   PLT 203  --  198 150 200  < > = values in this interval not displayed.  Lab Results  Component Value Date   TSH 1.59 04/11/2015   No results found for: HGBA1C No results found for: CHOL, HDL, LDLCALC, LDLDIRECT, TRIG, CHOLHDL  Significant Diagnostic Results since last visit: none  Patient Care Team: Estill Dooms, MD as PCP - General (Internal Medicine) Gaynelle Arabian, MD as Consulting Physician (Orthopedic Surgery) Calvert Cantor, MD as Consulting Physician (Ophthalmology) Vania Rea, MD as Consulting Physician (Obstetrics and  Gynecology)  Assessment/Plan Problem List Items Addressed This Visit    HTN (hypertension) (Chronic)    Controlled, continue Amlodipine 10mg  daily and Labetalol 100mg  bid.        Hypothyroidism (Chronic)    04/11/15 TSH 1.587 Continue Levothyroxine 13mcg.       Dementia (Chronic)    Rapid decline, off Aricept, no longer beneficial, Hospice service      Edema (Chronic)    Not apparent, off diuretics.       Depression    Continue Celexa 20mg  for mood, continue to observe the patient.       Constipation    Stable, coninue Senna S II daily and MiraLax daily.      Left shoulder pain    Multiple sites, continue Fentanyl      Hypokalemia    Last serum K 3.8 04/11/15      FTT (failure to thrive) in adult    Continue Hospice service, comfort measures.       Rash and nonspecific skin eruption - Primary    Lower chest, abd, mid back, small red macular dots, non itching, ? Allergic reaction, will try Benadryl prn, observe.           Family/ staff Communication: Hospice service.   Labs/tests ordered:  none  ManXie Aftan Vint NP Geriatrics [facility] [address], [place] [address] On Call:  [phone] & follow prompts after 5pm & weekends Office Phone:  [phone] Office Fax:  [phone]

## 2015-05-08 NOTE — Assessment & Plan Note (Signed)
Last serum K 3.8 04/11/15

## 2015-05-08 NOTE — Assessment & Plan Note (Signed)
Continue Hospice service, comfort measures.

## 2015-05-08 NOTE — Assessment & Plan Note (Signed)
Lower chest, abd, mid back, small red macular dots, non itching, ? Allergic reaction, will try Benadryl prn, observe.

## 2015-05-08 NOTE — Assessment & Plan Note (Signed)
Rapid decline, off Aricept, no longer beneficial, Hospice service

## 2015-05-08 NOTE — Assessment & Plan Note (Addendum)
Controlled, continue Amlodipine 10mg  daily and Labetalol 100mg  bid.

## 2015-05-08 NOTE — Assessment & Plan Note (Signed)
Continue Celexa 20mg  for mood, continue to observe the patient.

## 2015-05-08 NOTE — Assessment & Plan Note (Signed)
Multiple sites, continue Fentanyl

## 2015-05-08 NOTE — Assessment & Plan Note (Signed)
04/11/15 TSH 1.587 Continue Levothyroxine 14mcg.

## 2015-05-08 NOTE — Assessment & Plan Note (Signed)
Stable, coninue Senna S II daily and MiraLax daily.

## 2015-05-30 DEATH — deceased

## 2016-12-31 IMAGING — CR DG RIBS W/ CHEST 3+V*R*
5 series · 5 of 5 positions shown · non-contrast
Comparison: 11/11/2014

CLINICAL DATA: Fall.  Right chest pain.

EXAM:
RIGHT RIBS AND CHEST - 3+ VIEW

[x chest ap (1 of 5)]
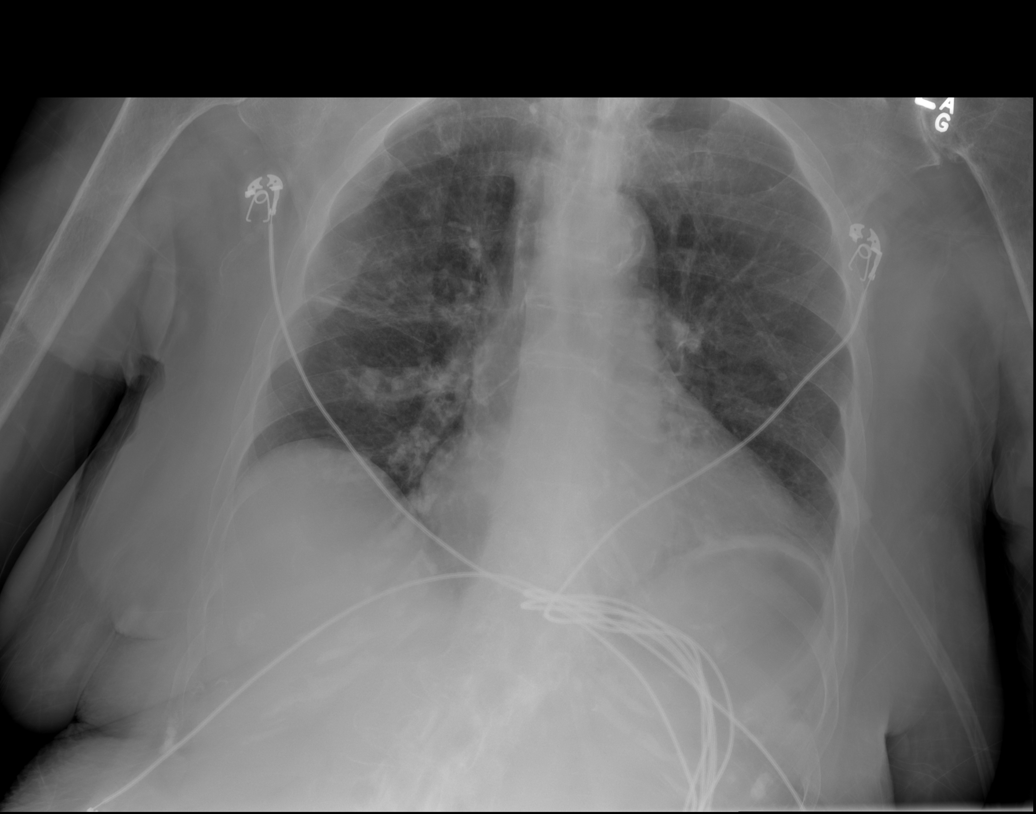

[x chest ap (2 of 5)]
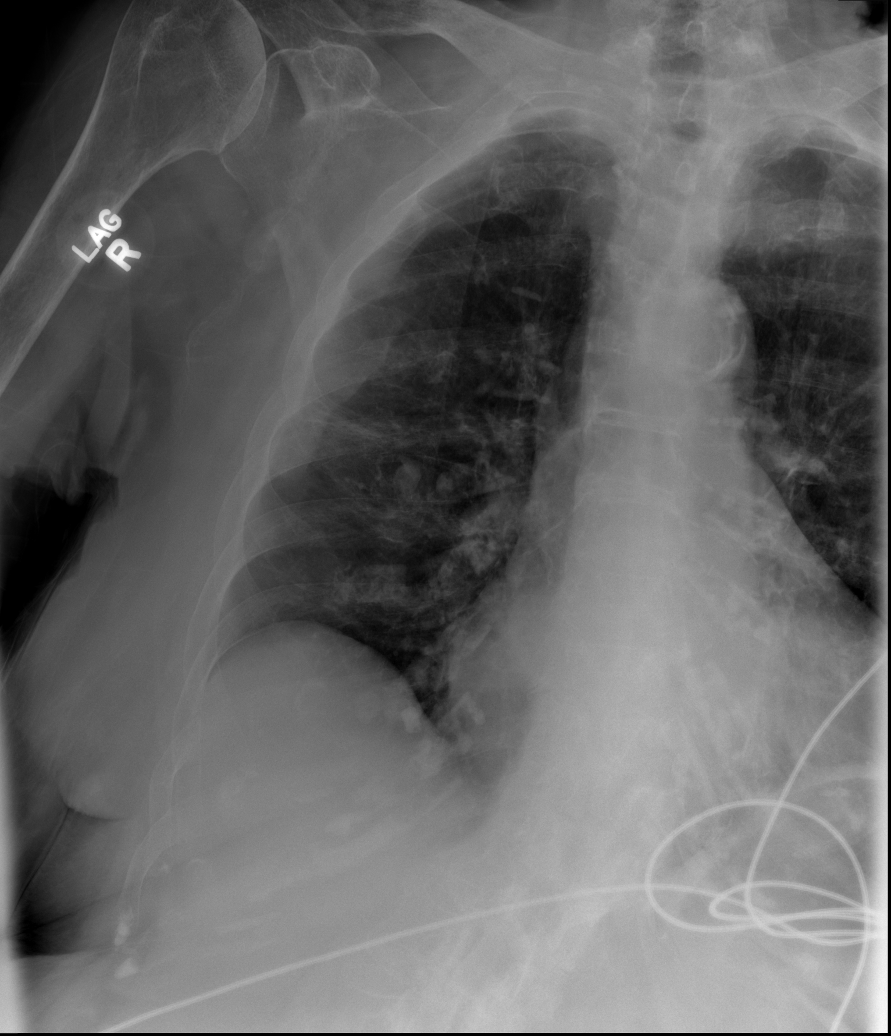

[x chest ap (3 of 5)]
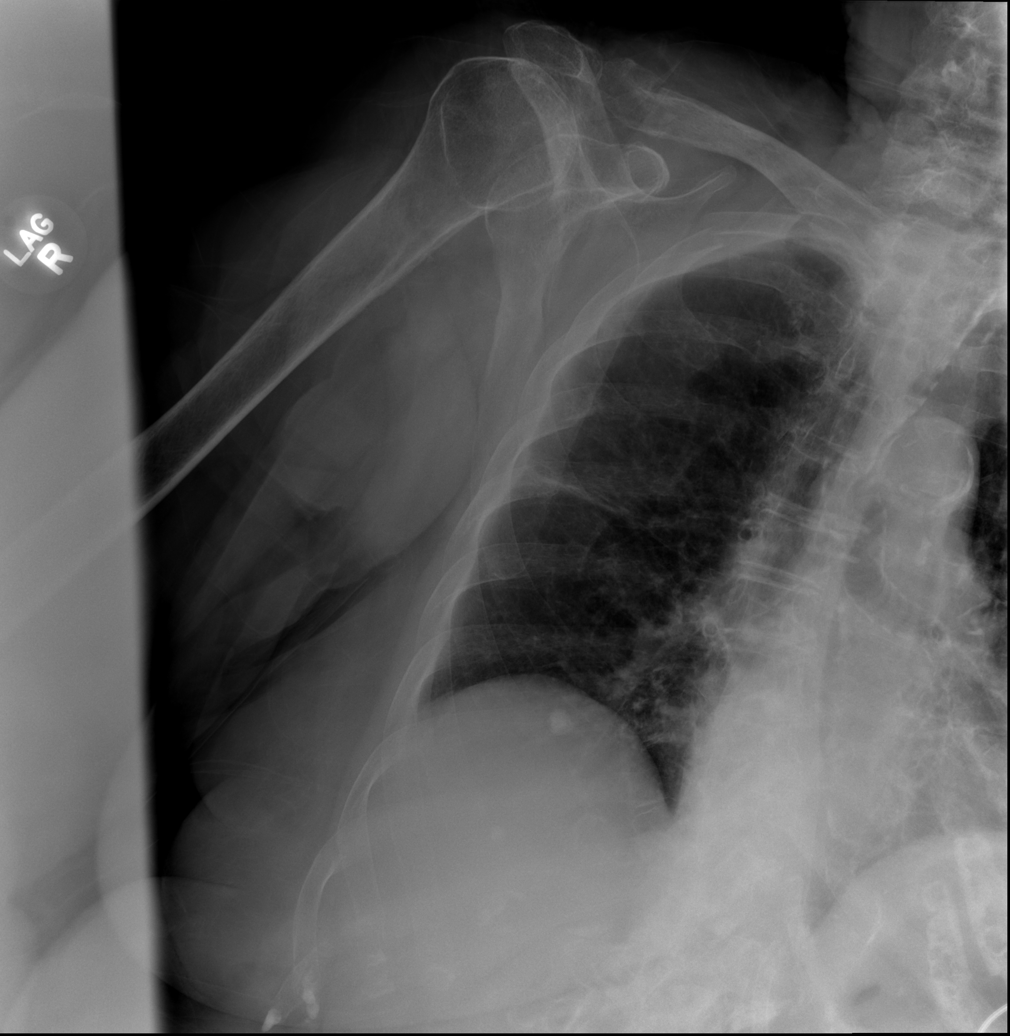

[x chest ap (4 of 5)]
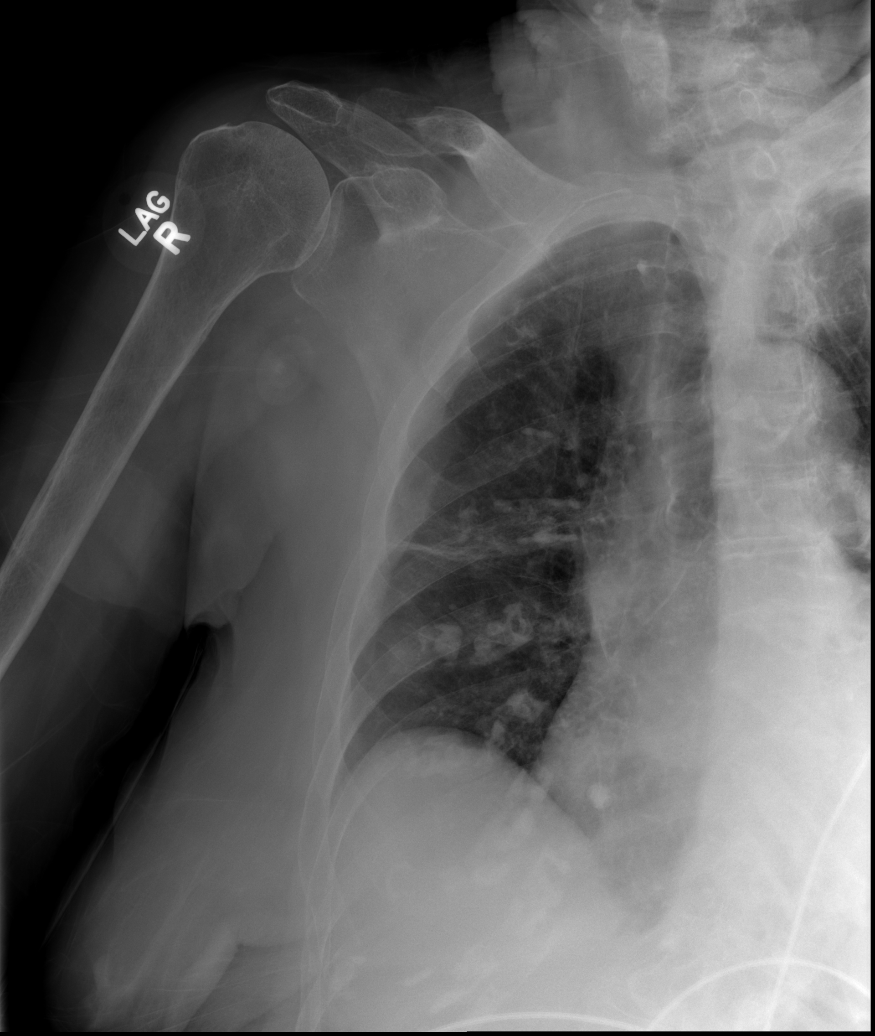

[x chest ap (5 of 5)]
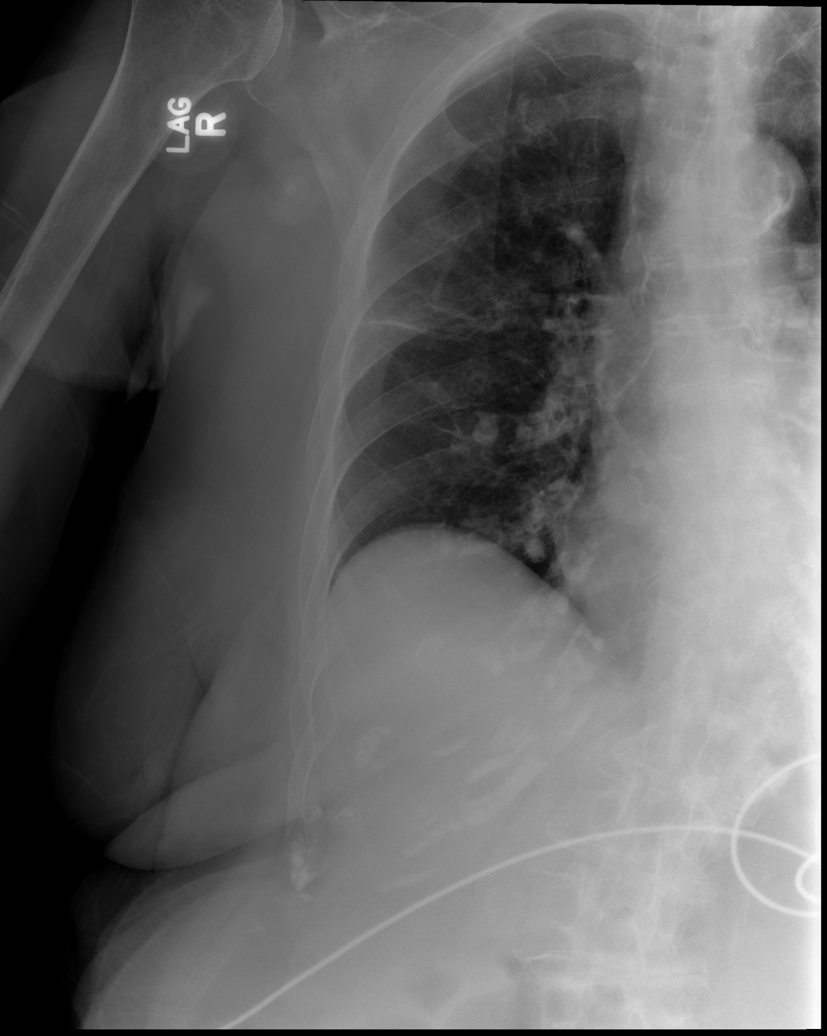

[5 of 5 positions shown; findings below may reference images not displayed]

FINDINGS: The heart is moderately enlarged. Linear scar versus atelectasis in
the inferior right upper lobe. Low lung volumes. No pneumothorax.
Normal vascularity.

Acute minimally displaced fractures of the right first second and
third ribs are noted. The right fifth rib fracture is chronic and
shows evidence of healing. Chronic left rib deformities. Osteopenia.
IMPRESSION: Acute right first second and third rib fractures.

Cardiomegaly without decompensation.
